# Patient Record
Sex: Female | Born: 1935 | Race: White | Hispanic: No | Smoking: Never smoker
Health system: Southern US, Community
[De-identification: ages and names within clinical notes are randomized; demographics above are authoritative.]

## PROBLEM LIST (undated history)

## (undated) DIAGNOSIS — R011 Cardiac murmur, unspecified: Secondary | ICD-10-CM

## (undated) DIAGNOSIS — T4145XA Adverse effect of unspecified anesthetic, initial encounter: Secondary | ICD-10-CM

## (undated) DIAGNOSIS — R112 Nausea with vomiting, unspecified: Secondary | ICD-10-CM

## (undated) DIAGNOSIS — G4730 Sleep apnea, unspecified: Secondary | ICD-10-CM

## (undated) DIAGNOSIS — T7840XA Allergy, unspecified, initial encounter: Secondary | ICD-10-CM

## (undated) DIAGNOSIS — J45909 Unspecified asthma, uncomplicated: Secondary | ICD-10-CM

## (undated) DIAGNOSIS — T8859XA Other complications of anesthesia, initial encounter: Secondary | ICD-10-CM

## (undated) DIAGNOSIS — Z9889 Other specified postprocedural states: Secondary | ICD-10-CM

## (undated) DIAGNOSIS — K219 Gastro-esophageal reflux disease without esophagitis: Secondary | ICD-10-CM

## (undated) DIAGNOSIS — J449 Chronic obstructive pulmonary disease, unspecified: Secondary | ICD-10-CM

## (undated) DIAGNOSIS — N3010 Interstitial cystitis (chronic) without hematuria: Secondary | ICD-10-CM

## (undated) HISTORY — DX: Allergy, unspecified, initial encounter: T78.40XA

## (undated) HISTORY — PX: CHOLECYSTECTOMY: SHX55

## (undated) HISTORY — PX: EYE SURGERY: SHX253

## (undated) HISTORY — PX: BLADDER SURGERY: SHX569

## (undated) HISTORY — PX: APPENDECTOMY: SHX54

## (undated) HISTORY — PX: ABDOMINAL HYSTERECTOMY: SHX81

## (undated) HISTORY — PX: TONSILLECTOMY: SUR1361

## (undated) HISTORY — DX: Unspecified asthma, uncomplicated: J45.909

---
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
## 2004-04-30 ENCOUNTER — Ambulatory Visit: Payer: Self-pay | Admitting: Internal Medicine

## 2005-05-31 ENCOUNTER — Ambulatory Visit: Payer: Self-pay | Admitting: Internal Medicine

## 2006-06-02 ENCOUNTER — Ambulatory Visit: Payer: Self-pay | Admitting: Internal Medicine

## 2006-06-12 ENCOUNTER — Ambulatory Visit: Payer: Self-pay | Admitting: Internal Medicine

## 2006-12-20 ENCOUNTER — Ambulatory Visit: Payer: Self-pay | Admitting: Internal Medicine

## 2007-06-04 ENCOUNTER — Ambulatory Visit: Payer: Self-pay | Admitting: Internal Medicine

## 2007-08-29 ENCOUNTER — Ambulatory Visit: Payer: Self-pay | Admitting: Internal Medicine

## 2008-01-13 ENCOUNTER — Ambulatory Visit: Payer: Self-pay | Admitting: Family Medicine

## 2008-06-10 ENCOUNTER — Ambulatory Visit: Payer: Self-pay | Admitting: Internal Medicine

## 2009-06-15 ENCOUNTER — Ambulatory Visit: Payer: Self-pay | Admitting: Internal Medicine

## 2010-01-27 ENCOUNTER — Emergency Department: Payer: Self-pay | Admitting: Emergency Medicine

## 2010-01-28 ENCOUNTER — Inpatient Hospital Stay: Payer: Self-pay | Admitting: Surgery

## 2010-01-31 ENCOUNTER — Ambulatory Visit: Payer: Self-pay | Admitting: Internal Medicine

## 2010-04-27 ENCOUNTER — Ambulatory Visit: Payer: Self-pay | Admitting: Urology

## 2010-07-08 ENCOUNTER — Ambulatory Visit: Payer: Self-pay | Admitting: Internal Medicine

## 2010-08-03 ENCOUNTER — Ambulatory Visit: Payer: Self-pay | Admitting: Urology

## 2010-08-31 ENCOUNTER — Ambulatory Visit: Payer: Self-pay | Admitting: Urology

## 2011-04-27 ENCOUNTER — Ambulatory Visit: Payer: Self-pay | Admitting: Unknown Physician Specialty

## 2011-07-21 ENCOUNTER — Ambulatory Visit: Payer: Self-pay | Admitting: Internal Medicine

## 2011-10-19 ENCOUNTER — Ambulatory Visit: Payer: Self-pay | Admitting: Internal Medicine

## 2012-01-13 ENCOUNTER — Ambulatory Visit: Payer: Self-pay | Admitting: Specialist

## 2012-01-23 ENCOUNTER — Ambulatory Visit: Payer: Self-pay | Admitting: Specialist

## 2012-01-23 LAB — CREATININE, SERUM
EGFR (African American): >60
EGFR (Non-African Amer.): 55 — ABNORMAL LOW

## 2012-10-03 ENCOUNTER — Ambulatory Visit: Payer: Self-pay | Admitting: Internal Medicine

## 2012-12-21 DIAGNOSIS — R339 Retention of urine, unspecified: Secondary | ICD-10-CM | POA: Insufficient documentation

## 2012-12-21 DIAGNOSIS — R351 Nocturia: Secondary | ICD-10-CM | POA: Insufficient documentation

## 2012-12-21 DIAGNOSIS — N3010 Interstitial cystitis (chronic) without hematuria: Secondary | ICD-10-CM | POA: Insufficient documentation

## 2012-12-21 DIAGNOSIS — N393 Stress incontinence (female) (male): Secondary | ICD-10-CM | POA: Insufficient documentation

## 2013-06-22 ENCOUNTER — Emergency Department: Payer: Self-pay | Admitting: Emergency Medicine

## 2013-06-22 LAB — CBC
HCT: 41 % (ref 35.0–47.0)
HGB: 12.7 g/dL (ref 12.0–16.0)
MCH: 25.3 pg — ABNORMAL LOW (ref 26.0–34.0)
MCHC: 30.9 g/dL — ABNORMAL LOW (ref 32.0–36.0)
MCV: 82 fL (ref 80–100)
Platelet: 280 10*3/uL (ref 150–440)
RBC: 5.01 10*6/uL (ref 3.80–5.20)
RDW: 15.5 % — ABNORMAL HIGH (ref 11.5–14.5)
WBC: 15.4 10*3/uL — ABNORMAL HIGH (ref 3.6–11.0)

## 2013-06-22 LAB — BASIC METABOLIC PANEL
Anion Gap: 7 (ref 7–16)
BUN: 16 mg/dL (ref 7–18)
Calcium, Total: 9.3 mg/dL (ref 8.5–10.1)
Chloride: 103 mmol/L (ref 98–107)
Co2: 29 mmol/L (ref 21–32)
Creatinine: 0.91 mg/dL (ref 0.60–1.30)
EGFR (African American): >60
EGFR (Non-African Amer.): >60
Glucose: 124 mg/dL — ABNORMAL HIGH (ref 65–99)
Osmolality: 280 (ref 275–301)
Potassium: 3.9 mmol/L (ref 3.5–5.1)
Sodium: 139 mmol/L (ref 136–145)

## 2013-09-11 DIAGNOSIS — R059 Cough, unspecified: Secondary | ICD-10-CM | POA: Insufficient documentation

## 2013-11-28 ENCOUNTER — Ambulatory Visit: Payer: Self-pay | Admitting: Internal Medicine

## 2013-12-04 ENCOUNTER — Ambulatory Visit: Payer: Self-pay | Admitting: Internal Medicine

## 2013-12-16 DIAGNOSIS — M1990 Unspecified osteoarthritis, unspecified site: Secondary | ICD-10-CM | POA: Insufficient documentation

## 2013-12-16 DIAGNOSIS — M0579 Rheumatoid arthritis with rheumatoid factor of multiple sites without organ or systems involvement: Secondary | ICD-10-CM | POA: Insufficient documentation

## 2013-12-16 DIAGNOSIS — M069 Rheumatoid arthritis, unspecified: Secondary | ICD-10-CM | POA: Insufficient documentation

## 2013-12-26 ENCOUNTER — Ambulatory Visit: Payer: Self-pay | Admitting: Specialist

## 2014-05-28 DIAGNOSIS — F419 Anxiety disorder, unspecified: Secondary | ICD-10-CM | POA: Insufficient documentation

## 2014-06-05 DIAGNOSIS — R8281 Pyuria: Secondary | ICD-10-CM | POA: Insufficient documentation

## 2014-06-05 DIAGNOSIS — R82998 Other abnormal findings in urine: Secondary | ICD-10-CM | POA: Insufficient documentation

## 2014-06-05 DIAGNOSIS — N390 Urinary tract infection, site not specified: Secondary | ICD-10-CM | POA: Insufficient documentation

## 2014-07-29 ENCOUNTER — Ambulatory Visit
Admission: RE | Admit: 2014-07-29 | Discharge: 2014-07-29 | Disposition: A | Discharge status: Home / Self Care | Payer: PPO | Source: Ambulatory Visit | Attending: Internal Medicine | Admitting: Internal Medicine

## 2014-07-29 ENCOUNTER — Other Ambulatory Visit: Payer: Self-pay | Admitting: Internal Medicine

## 2014-07-29 DIAGNOSIS — M7989 Other specified soft tissue disorders: Secondary | ICD-10-CM

## 2014-11-04 ENCOUNTER — Other Ambulatory Visit: Payer: Self-pay | Admitting: Internal Medicine

## 2014-11-04 DIAGNOSIS — Z1231 Encounter for screening mammogram for malignant neoplasm of breast: Secondary | ICD-10-CM

## 2014-12-08 ENCOUNTER — Ambulatory Visit: Payer: PPO

## 2014-12-16 ENCOUNTER — Ambulatory Visit
Admission: RE | Admit: 2014-12-16 | Discharge: 2014-12-16 | Disposition: A | Discharge status: Home / Self Care | Payer: PPO | Source: Ambulatory Visit | Attending: Internal Medicine | Admitting: Internal Medicine

## 2014-12-16 DIAGNOSIS — Z1231 Encounter for screening mammogram for malignant neoplasm of breast: Secondary | ICD-10-CM | POA: Diagnosis not present

## 2015-03-11 DIAGNOSIS — S92025K Nondisplaced fracture of anterior process of left calcaneus, subsequent encounter for fracture with nonunion: Secondary | ICD-10-CM | POA: Diagnosis not present

## 2015-03-11 DIAGNOSIS — M79672 Pain in left foot: Secondary | ICD-10-CM | POA: Diagnosis not present

## 2015-03-19 DIAGNOSIS — N3010 Interstitial cystitis (chronic) without hematuria: Secondary | ICD-10-CM | POA: Diagnosis not present

## 2015-03-19 DIAGNOSIS — J4530 Mild persistent asthma, uncomplicated: Secondary | ICD-10-CM | POA: Diagnosis not present

## 2015-03-19 DIAGNOSIS — M25512 Pain in left shoulder: Secondary | ICD-10-CM | POA: Diagnosis not present

## 2015-03-19 DIAGNOSIS — M0579 Rheumatoid arthritis with rheumatoid factor of multiple sites without organ or systems involvement: Secondary | ICD-10-CM | POA: Diagnosis not present

## 2015-03-19 DIAGNOSIS — M150 Primary generalized (osteo)arthritis: Secondary | ICD-10-CM | POA: Diagnosis not present

## 2015-03-19 DIAGNOSIS — R05 Cough: Secondary | ICD-10-CM | POA: Diagnosis not present

## 2015-04-01 DIAGNOSIS — M7582 Other shoulder lesions, left shoulder: Secondary | ICD-10-CM | POA: Insufficient documentation

## 2015-04-01 DIAGNOSIS — M25512 Pain in left shoulder: Secondary | ICD-10-CM | POA: Diagnosis not present

## 2015-04-02 DIAGNOSIS — M25612 Stiffness of left shoulder, not elsewhere classified: Secondary | ICD-10-CM | POA: Diagnosis not present

## 2015-04-02 DIAGNOSIS — M25512 Pain in left shoulder: Secondary | ICD-10-CM | POA: Diagnosis not present

## 2015-04-02 DIAGNOSIS — M7582 Other shoulder lesions, left shoulder: Secondary | ICD-10-CM | POA: Diagnosis not present

## 2015-04-02 DIAGNOSIS — M6281 Muscle weakness (generalized): Secondary | ICD-10-CM | POA: Diagnosis not present

## 2015-04-06 DIAGNOSIS — M25512 Pain in left shoulder: Secondary | ICD-10-CM | POA: Diagnosis not present

## 2015-04-06 DIAGNOSIS — M25612 Stiffness of left shoulder, not elsewhere classified: Secondary | ICD-10-CM | POA: Diagnosis not present

## 2015-04-06 DIAGNOSIS — M7582 Other shoulder lesions, left shoulder: Secondary | ICD-10-CM | POA: Diagnosis not present

## 2015-04-06 DIAGNOSIS — M6281 Muscle weakness (generalized): Secondary | ICD-10-CM | POA: Diagnosis not present

## 2015-04-08 DIAGNOSIS — M25512 Pain in left shoulder: Secondary | ICD-10-CM | POA: Diagnosis not present

## 2015-04-08 DIAGNOSIS — M6281 Muscle weakness (generalized): Secondary | ICD-10-CM | POA: Diagnosis not present

## 2015-04-08 DIAGNOSIS — M25612 Stiffness of left shoulder, not elsewhere classified: Secondary | ICD-10-CM | POA: Diagnosis not present

## 2015-04-08 DIAGNOSIS — M7582 Other shoulder lesions, left shoulder: Secondary | ICD-10-CM | POA: Diagnosis not present

## 2015-04-09 DIAGNOSIS — L309 Dermatitis, unspecified: Secondary | ICD-10-CM | POA: Diagnosis not present

## 2015-04-13 DIAGNOSIS — M7061 Trochanteric bursitis, right hip: Secondary | ICD-10-CM | POA: Insufficient documentation

## 2015-04-13 DIAGNOSIS — M25551 Pain in right hip: Secondary | ICD-10-CM | POA: Diagnosis not present

## 2015-04-13 DIAGNOSIS — M79672 Pain in left foot: Secondary | ICD-10-CM | POA: Diagnosis not present

## 2015-04-13 DIAGNOSIS — M7062 Trochanteric bursitis, left hip: Secondary | ICD-10-CM | POA: Diagnosis not present

## 2015-04-13 DIAGNOSIS — M25552 Pain in left hip: Secondary | ICD-10-CM | POA: Diagnosis not present

## 2015-04-13 DIAGNOSIS — S92025K Nondisplaced fracture of anterior process of left calcaneus, subsequent encounter for fracture with nonunion: Secondary | ICD-10-CM | POA: Diagnosis not present

## 2015-04-14 DIAGNOSIS — M6281 Muscle weakness (generalized): Secondary | ICD-10-CM | POA: Diagnosis not present

## 2015-04-14 DIAGNOSIS — M7582 Other shoulder lesions, left shoulder: Secondary | ICD-10-CM | POA: Diagnosis not present

## 2015-04-14 DIAGNOSIS — M25512 Pain in left shoulder: Secondary | ICD-10-CM | POA: Diagnosis not present

## 2015-04-14 DIAGNOSIS — M25612 Stiffness of left shoulder, not elsewhere classified: Secondary | ICD-10-CM | POA: Diagnosis not present

## 2015-04-16 DIAGNOSIS — M25512 Pain in left shoulder: Secondary | ICD-10-CM | POA: Diagnosis not present

## 2015-04-21 DIAGNOSIS — M25612 Stiffness of left shoulder, not elsewhere classified: Secondary | ICD-10-CM | POA: Diagnosis not present

## 2015-04-21 DIAGNOSIS — M25512 Pain in left shoulder: Secondary | ICD-10-CM | POA: Diagnosis not present

## 2015-04-21 DIAGNOSIS — M7582 Other shoulder lesions, left shoulder: Secondary | ICD-10-CM | POA: Diagnosis not present

## 2015-04-21 DIAGNOSIS — M6281 Muscle weakness (generalized): Secondary | ICD-10-CM | POA: Diagnosis not present

## 2015-04-23 DIAGNOSIS — M25512 Pain in left shoulder: Secondary | ICD-10-CM | POA: Diagnosis not present

## 2015-04-28 DIAGNOSIS — M25612 Stiffness of left shoulder, not elsewhere classified: Secondary | ICD-10-CM | POA: Diagnosis not present

## 2015-04-28 DIAGNOSIS — M25512 Pain in left shoulder: Secondary | ICD-10-CM | POA: Diagnosis not present

## 2015-04-28 DIAGNOSIS — M7582 Other shoulder lesions, left shoulder: Secondary | ICD-10-CM | POA: Diagnosis not present

## 2015-04-28 DIAGNOSIS — M6281 Muscle weakness (generalized): Secondary | ICD-10-CM | POA: Diagnosis not present

## 2015-04-30 DIAGNOSIS — M6281 Muscle weakness (generalized): Secondary | ICD-10-CM | POA: Diagnosis not present

## 2015-04-30 DIAGNOSIS — M7582 Other shoulder lesions, left shoulder: Secondary | ICD-10-CM | POA: Diagnosis not present

## 2015-04-30 DIAGNOSIS — M25512 Pain in left shoulder: Secondary | ICD-10-CM | POA: Diagnosis not present

## 2015-04-30 DIAGNOSIS — M25612 Stiffness of left shoulder, not elsewhere classified: Secondary | ICD-10-CM | POA: Diagnosis not present

## 2015-05-11 DIAGNOSIS — M25612 Stiffness of left shoulder, not elsewhere classified: Secondary | ICD-10-CM | POA: Diagnosis not present

## 2015-05-11 DIAGNOSIS — S92025K Nondisplaced fracture of anterior process of left calcaneus, subsequent encounter for fracture with nonunion: Secondary | ICD-10-CM | POA: Diagnosis not present

## 2015-05-11 DIAGNOSIS — M25512 Pain in left shoulder: Secondary | ICD-10-CM | POA: Diagnosis not present

## 2015-05-11 DIAGNOSIS — M7582 Other shoulder lesions, left shoulder: Secondary | ICD-10-CM | POA: Diagnosis not present

## 2015-05-11 DIAGNOSIS — M79672 Pain in left foot: Secondary | ICD-10-CM | POA: Diagnosis not present

## 2015-05-11 DIAGNOSIS — M6281 Muscle weakness (generalized): Secondary | ICD-10-CM | POA: Diagnosis not present

## 2015-05-12 DIAGNOSIS — H35372 Puckering of macula, left eye: Secondary | ICD-10-CM | POA: Diagnosis not present

## 2015-05-19 DIAGNOSIS — M7582 Other shoulder lesions, left shoulder: Secondary | ICD-10-CM | POA: Diagnosis not present

## 2015-05-19 DIAGNOSIS — M25612 Stiffness of left shoulder, not elsewhere classified: Secondary | ICD-10-CM | POA: Diagnosis not present

## 2015-05-19 DIAGNOSIS — M25512 Pain in left shoulder: Secondary | ICD-10-CM | POA: Diagnosis not present

## 2015-05-19 DIAGNOSIS — M6281 Muscle weakness (generalized): Secondary | ICD-10-CM | POA: Diagnosis not present

## 2015-05-27 DIAGNOSIS — M7582 Other shoulder lesions, left shoulder: Secondary | ICD-10-CM | POA: Diagnosis not present

## 2015-06-05 DIAGNOSIS — N393 Stress incontinence (female) (male): Secondary | ICD-10-CM | POA: Diagnosis not present

## 2015-06-05 DIAGNOSIS — R350 Frequency of micturition: Secondary | ICD-10-CM | POA: Diagnosis not present

## 2015-06-05 DIAGNOSIS — R339 Retention of urine, unspecified: Secondary | ICD-10-CM | POA: Diagnosis not present

## 2015-06-05 DIAGNOSIS — N3010 Interstitial cystitis (chronic) without hematuria: Secondary | ICD-10-CM | POA: Diagnosis not present

## 2015-06-15 DIAGNOSIS — M79672 Pain in left foot: Secondary | ICD-10-CM | POA: Diagnosis not present

## 2015-06-15 DIAGNOSIS — S92025K Nondisplaced fracture of anterior process of left calcaneus, subsequent encounter for fracture with nonunion: Secondary | ICD-10-CM | POA: Diagnosis not present

## 2015-06-18 DIAGNOSIS — M0579 Rheumatoid arthritis with rheumatoid factor of multiple sites without organ or systems involvement: Secondary | ICD-10-CM | POA: Diagnosis not present

## 2015-06-22 DIAGNOSIS — M25675 Stiffness of left foot, not elsewhere classified: Secondary | ICD-10-CM | POA: Diagnosis not present

## 2015-06-22 DIAGNOSIS — M6281 Muscle weakness (generalized): Secondary | ICD-10-CM | POA: Diagnosis not present

## 2015-06-22 DIAGNOSIS — M79672 Pain in left foot: Secondary | ICD-10-CM | POA: Diagnosis not present

## 2015-06-22 DIAGNOSIS — R262 Difficulty in walking, not elsewhere classified: Secondary | ICD-10-CM | POA: Diagnosis not present

## 2015-06-25 DIAGNOSIS — J449 Chronic obstructive pulmonary disease, unspecified: Secondary | ICD-10-CM | POA: Diagnosis not present

## 2015-06-25 DIAGNOSIS — R0609 Other forms of dyspnea: Secondary | ICD-10-CM | POA: Diagnosis not present

## 2015-06-25 DIAGNOSIS — J986 Disorders of diaphragm: Secondary | ICD-10-CM | POA: Diagnosis not present

## 2015-06-25 DIAGNOSIS — M0579 Rheumatoid arthritis with rheumatoid factor of multiple sites without organ or systems involvement: Secondary | ICD-10-CM | POA: Diagnosis not present

## 2015-06-25 DIAGNOSIS — J45909 Unspecified asthma, uncomplicated: Secondary | ICD-10-CM | POA: Diagnosis not present

## 2015-06-25 DIAGNOSIS — M6281 Muscle weakness (generalized): Secondary | ICD-10-CM | POA: Diagnosis not present

## 2015-06-25 DIAGNOSIS — M25675 Stiffness of left foot, not elsewhere classified: Secondary | ICD-10-CM | POA: Diagnosis not present

## 2015-06-25 DIAGNOSIS — R05 Cough: Secondary | ICD-10-CM | POA: Diagnosis not present

## 2015-06-25 DIAGNOSIS — M79672 Pain in left foot: Secondary | ICD-10-CM | POA: Diagnosis not present

## 2015-06-25 DIAGNOSIS — M150 Primary generalized (osteo)arthritis: Secondary | ICD-10-CM | POA: Diagnosis not present

## 2015-06-25 DIAGNOSIS — M65331 Trigger finger, right middle finger: Secondary | ICD-10-CM | POA: Diagnosis not present

## 2015-06-25 DIAGNOSIS — R262 Difficulty in walking, not elsewhere classified: Secondary | ICD-10-CM | POA: Diagnosis not present

## 2015-06-29 DIAGNOSIS — M25675 Stiffness of left foot, not elsewhere classified: Secondary | ICD-10-CM | POA: Diagnosis not present

## 2015-06-29 DIAGNOSIS — M6281 Muscle weakness (generalized): Secondary | ICD-10-CM | POA: Diagnosis not present

## 2015-06-29 DIAGNOSIS — M79672 Pain in left foot: Secondary | ICD-10-CM | POA: Diagnosis not present

## 2015-07-01 DIAGNOSIS — M79672 Pain in left foot: Secondary | ICD-10-CM | POA: Diagnosis not present

## 2015-07-01 DIAGNOSIS — R262 Difficulty in walking, not elsewhere classified: Secondary | ICD-10-CM | POA: Diagnosis not present

## 2015-07-01 DIAGNOSIS — M25675 Stiffness of left foot, not elsewhere classified: Secondary | ICD-10-CM | POA: Diagnosis not present

## 2015-07-01 DIAGNOSIS — M6281 Muscle weakness (generalized): Secondary | ICD-10-CM | POA: Diagnosis not present

## 2015-07-06 DIAGNOSIS — M6281 Muscle weakness (generalized): Secondary | ICD-10-CM | POA: Diagnosis not present

## 2015-07-06 DIAGNOSIS — M79672 Pain in left foot: Secondary | ICD-10-CM | POA: Diagnosis not present

## 2015-07-06 DIAGNOSIS — M25675 Stiffness of left foot, not elsewhere classified: Secondary | ICD-10-CM | POA: Diagnosis not present

## 2015-07-08 DIAGNOSIS — R262 Difficulty in walking, not elsewhere classified: Secondary | ICD-10-CM | POA: Diagnosis not present

## 2015-07-08 DIAGNOSIS — M79672 Pain in left foot: Secondary | ICD-10-CM | POA: Diagnosis not present

## 2015-07-08 DIAGNOSIS — M25675 Stiffness of left foot, not elsewhere classified: Secondary | ICD-10-CM | POA: Diagnosis not present

## 2015-07-08 DIAGNOSIS — M6281 Muscle weakness (generalized): Secondary | ICD-10-CM | POA: Diagnosis not present

## 2015-07-15 DIAGNOSIS — S92025K Nondisplaced fracture of anterior process of left calcaneus, subsequent encounter for fracture with nonunion: Secondary | ICD-10-CM | POA: Diagnosis not present

## 2015-07-15 DIAGNOSIS — M25675 Stiffness of left foot, not elsewhere classified: Secondary | ICD-10-CM | POA: Diagnosis not present

## 2015-07-15 DIAGNOSIS — M79672 Pain in left foot: Secondary | ICD-10-CM | POA: Diagnosis not present

## 2015-07-15 DIAGNOSIS — M6281 Muscle weakness (generalized): Secondary | ICD-10-CM | POA: Diagnosis not present

## 2015-07-16 DIAGNOSIS — Z780 Asymptomatic menopausal state: Secondary | ICD-10-CM | POA: Diagnosis not present

## 2015-07-16 DIAGNOSIS — F413 Other mixed anxiety disorders: Secondary | ICD-10-CM | POA: Diagnosis not present

## 2015-07-16 DIAGNOSIS — M0579 Rheumatoid arthritis with rheumatoid factor of multiple sites without organ or systems involvement: Secondary | ICD-10-CM | POA: Diagnosis not present

## 2015-07-16 DIAGNOSIS — R339 Retention of urine, unspecified: Secondary | ICD-10-CM | POA: Diagnosis not present

## 2015-07-16 DIAGNOSIS — M150 Primary generalized (osteo)arthritis: Secondary | ICD-10-CM | POA: Diagnosis not present

## 2015-07-16 DIAGNOSIS — Z6841 Body Mass Index (BMI) 40.0 and over, adult: Secondary | ICD-10-CM | POA: Diagnosis not present

## 2015-07-16 DIAGNOSIS — J4530 Mild persistent asthma, uncomplicated: Secondary | ICD-10-CM | POA: Diagnosis not present

## 2015-07-16 DIAGNOSIS — R05 Cough: Secondary | ICD-10-CM | POA: Diagnosis not present

## 2015-07-22 DIAGNOSIS — M79672 Pain in left foot: Secondary | ICD-10-CM | POA: Diagnosis not present

## 2015-07-22 DIAGNOSIS — M6281 Muscle weakness (generalized): Secondary | ICD-10-CM | POA: Diagnosis not present

## 2015-07-22 DIAGNOSIS — M25675 Stiffness of left foot, not elsewhere classified: Secondary | ICD-10-CM | POA: Diagnosis not present

## 2015-07-23 DIAGNOSIS — M6281 Muscle weakness (generalized): Secondary | ICD-10-CM | POA: Diagnosis not present

## 2015-07-23 DIAGNOSIS — M79672 Pain in left foot: Secondary | ICD-10-CM | POA: Diagnosis not present

## 2015-07-23 DIAGNOSIS — R262 Difficulty in walking, not elsewhere classified: Secondary | ICD-10-CM | POA: Diagnosis not present

## 2015-07-23 DIAGNOSIS — M25675 Stiffness of left foot, not elsewhere classified: Secondary | ICD-10-CM | POA: Diagnosis not present

## 2015-07-28 DIAGNOSIS — R339 Retention of urine, unspecified: Secondary | ICD-10-CM | POA: Diagnosis not present

## 2015-07-28 DIAGNOSIS — R05 Cough: Secondary | ICD-10-CM | POA: Diagnosis not present

## 2015-07-28 DIAGNOSIS — F413 Other mixed anxiety disorders: Secondary | ICD-10-CM | POA: Diagnosis not present

## 2015-07-28 DIAGNOSIS — Z780 Asymptomatic menopausal state: Secondary | ICD-10-CM | POA: Diagnosis not present

## 2015-07-28 DIAGNOSIS — Z6841 Body Mass Index (BMI) 40.0 and over, adult: Secondary | ICD-10-CM | POA: Diagnosis not present

## 2015-07-28 DIAGNOSIS — J4530 Mild persistent asthma, uncomplicated: Secondary | ICD-10-CM | POA: Diagnosis not present

## 2015-07-28 DIAGNOSIS — M150 Primary generalized (osteo)arthritis: Secondary | ICD-10-CM | POA: Diagnosis not present

## 2015-07-28 DIAGNOSIS — M0579 Rheumatoid arthritis with rheumatoid factor of multiple sites without organ or systems involvement: Secondary | ICD-10-CM | POA: Diagnosis not present

## 2015-08-13 DIAGNOSIS — H903 Sensorineural hearing loss, bilateral: Secondary | ICD-10-CM | POA: Diagnosis not present

## 2015-08-13 DIAGNOSIS — H6123 Impacted cerumen, bilateral: Secondary | ICD-10-CM | POA: Diagnosis not present

## 2015-10-07 DIAGNOSIS — R339 Retention of urine, unspecified: Secondary | ICD-10-CM | POA: Diagnosis not present

## 2015-10-29 DIAGNOSIS — M65351 Trigger finger, right little finger: Secondary | ICD-10-CM | POA: Diagnosis not present

## 2015-10-29 DIAGNOSIS — R05 Cough: Secondary | ICD-10-CM | POA: Diagnosis not present

## 2015-10-29 DIAGNOSIS — R0609 Other forms of dyspnea: Secondary | ICD-10-CM | POA: Diagnosis not present

## 2015-10-29 DIAGNOSIS — M0579 Rheumatoid arthritis with rheumatoid factor of multiple sites without organ or systems involvement: Secondary | ICD-10-CM | POA: Diagnosis not present

## 2015-10-29 DIAGNOSIS — J4530 Mild persistent asthma, uncomplicated: Secondary | ICD-10-CM | POA: Diagnosis not present

## 2015-10-29 DIAGNOSIS — J310 Chronic rhinitis: Secondary | ICD-10-CM | POA: Diagnosis not present

## 2015-12-16 DIAGNOSIS — M150 Primary generalized (osteo)arthritis: Secondary | ICD-10-CM | POA: Diagnosis not present

## 2015-12-16 DIAGNOSIS — M0579 Rheumatoid arthritis with rheumatoid factor of multiple sites without organ or systems involvement: Secondary | ICD-10-CM | POA: Diagnosis not present

## 2015-12-23 DIAGNOSIS — M150 Primary generalized (osteo)arthritis: Secondary | ICD-10-CM | POA: Diagnosis not present

## 2015-12-23 DIAGNOSIS — G8929 Other chronic pain: Secondary | ICD-10-CM | POA: Diagnosis not present

## 2015-12-23 DIAGNOSIS — M65351 Trigger finger, right little finger: Secondary | ICD-10-CM | POA: Diagnosis not present

## 2015-12-23 DIAGNOSIS — M0579 Rheumatoid arthritis with rheumatoid factor of multiple sites without organ or systems involvement: Secondary | ICD-10-CM | POA: Diagnosis not present

## 2015-12-23 DIAGNOSIS — M545 Low back pain: Secondary | ICD-10-CM | POA: Diagnosis not present

## 2016-01-15 DIAGNOSIS — R339 Retention of urine, unspecified: Secondary | ICD-10-CM | POA: Diagnosis not present

## 2016-01-15 DIAGNOSIS — R05 Cough: Secondary | ICD-10-CM | POA: Diagnosis not present

## 2016-01-15 DIAGNOSIS — J4530 Mild persistent asthma, uncomplicated: Secondary | ICD-10-CM | POA: Diagnosis not present

## 2016-01-15 DIAGNOSIS — M0579 Rheumatoid arthritis with rheumatoid factor of multiple sites without organ or systems involvement: Secondary | ICD-10-CM | POA: Diagnosis not present

## 2016-01-15 DIAGNOSIS — Z780 Asymptomatic menopausal state: Secondary | ICD-10-CM | POA: Diagnosis not present

## 2016-01-15 DIAGNOSIS — F413 Other mixed anxiety disorders: Secondary | ICD-10-CM | POA: Diagnosis not present

## 2016-01-15 DIAGNOSIS — M150 Primary generalized (osteo)arthritis: Secondary | ICD-10-CM | POA: Diagnosis not present

## 2016-01-15 DIAGNOSIS — Z6841 Body Mass Index (BMI) 40.0 and over, adult: Secondary | ICD-10-CM | POA: Diagnosis not present

## 2016-01-18 ENCOUNTER — Other Ambulatory Visit: Payer: Self-pay | Admitting: Internal Medicine

## 2016-01-18 DIAGNOSIS — Z1231 Encounter for screening mammogram for malignant neoplasm of breast: Secondary | ICD-10-CM

## 2016-01-22 DIAGNOSIS — F419 Anxiety disorder, unspecified: Secondary | ICD-10-CM | POA: Diagnosis not present

## 2016-01-22 DIAGNOSIS — Z0000 Encounter for general adult medical examination without abnormal findings: Secondary | ICD-10-CM | POA: Diagnosis not present

## 2016-01-22 DIAGNOSIS — R05 Cough: Secondary | ICD-10-CM | POA: Diagnosis not present

## 2016-01-22 DIAGNOSIS — Z6841 Body Mass Index (BMI) 40.0 and over, adult: Secondary | ICD-10-CM | POA: Diagnosis not present

## 2016-01-22 DIAGNOSIS — M150 Primary generalized (osteo)arthritis: Secondary | ICD-10-CM | POA: Diagnosis not present

## 2016-01-22 DIAGNOSIS — J4540 Moderate persistent asthma, uncomplicated: Secondary | ICD-10-CM | POA: Diagnosis not present

## 2016-01-22 DIAGNOSIS — N3010 Interstitial cystitis (chronic) without hematuria: Secondary | ICD-10-CM | POA: Diagnosis not present

## 2016-01-22 DIAGNOSIS — Z23 Encounter for immunization: Secondary | ICD-10-CM | POA: Diagnosis not present

## 2016-02-02 DIAGNOSIS — R0609 Other forms of dyspnea: Secondary | ICD-10-CM | POA: Diagnosis not present

## 2016-02-02 DIAGNOSIS — J310 Chronic rhinitis: Secondary | ICD-10-CM | POA: Diagnosis not present

## 2016-02-02 DIAGNOSIS — R05 Cough: Secondary | ICD-10-CM | POA: Diagnosis not present

## 2016-02-02 DIAGNOSIS — J4540 Moderate persistent asthma, uncomplicated: Secondary | ICD-10-CM | POA: Diagnosis not present

## 2016-02-10 DIAGNOSIS — R300 Dysuria: Secondary | ICD-10-CM | POA: Diagnosis not present

## 2016-02-10 DIAGNOSIS — R350 Frequency of micturition: Secondary | ICD-10-CM | POA: Diagnosis not present

## 2016-02-10 DIAGNOSIS — R339 Retention of urine, unspecified: Secondary | ICD-10-CM | POA: Diagnosis not present

## 2016-02-25 ENCOUNTER — Ambulatory Visit
Admission: RE | Admit: 2016-02-25 | Discharge: 2016-02-25 | Disposition: A | Discharge status: Home / Self Care | Payer: PPO | Source: Ambulatory Visit | Attending: Internal Medicine | Admitting: Internal Medicine

## 2016-02-25 DIAGNOSIS — Z1231 Encounter for screening mammogram for malignant neoplasm of breast: Secondary | ICD-10-CM | POA: Diagnosis not present

## 2016-03-09 DIAGNOSIS — R05 Cough: Secondary | ICD-10-CM | POA: Diagnosis not present

## 2016-03-09 DIAGNOSIS — J4540 Moderate persistent asthma, uncomplicated: Secondary | ICD-10-CM | POA: Diagnosis not present

## 2016-03-09 DIAGNOSIS — J310 Chronic rhinitis: Secondary | ICD-10-CM | POA: Diagnosis not present

## 2016-03-14 DIAGNOSIS — R350 Frequency of micturition: Secondary | ICD-10-CM | POA: Diagnosis not present

## 2016-04-27 DIAGNOSIS — T85111A Breakdown (mechanical) of implanted electronic neurostimulator (electrode) of peripheral nerve, initial encounter: Secondary | ICD-10-CM | POA: Diagnosis not present

## 2016-04-27 DIAGNOSIS — N3010 Interstitial cystitis (chronic) without hematuria: Secondary | ICD-10-CM | POA: Diagnosis not present

## 2016-04-27 DIAGNOSIS — R350 Frequency of micturition: Secondary | ICD-10-CM | POA: Diagnosis not present

## 2016-04-27 DIAGNOSIS — R351 Nocturia: Secondary | ICD-10-CM | POA: Diagnosis not present

## 2016-05-12 DIAGNOSIS — Z0000 Encounter for general adult medical examination without abnormal findings: Secondary | ICD-10-CM | POA: Diagnosis not present

## 2016-05-12 DIAGNOSIS — R8290 Unspecified abnormal findings in urine: Secondary | ICD-10-CM | POA: Diagnosis not present

## 2016-05-12 DIAGNOSIS — R05 Cough: Secondary | ICD-10-CM | POA: Diagnosis not present

## 2016-05-12 DIAGNOSIS — J4540 Moderate persistent asthma, uncomplicated: Secondary | ICD-10-CM | POA: Diagnosis not present

## 2016-05-12 DIAGNOSIS — H35372 Puckering of macula, left eye: Secondary | ICD-10-CM | POA: Diagnosis not present

## 2016-05-12 DIAGNOSIS — F419 Anxiety disorder, unspecified: Secondary | ICD-10-CM | POA: Diagnosis not present

## 2016-05-12 DIAGNOSIS — N3010 Interstitial cystitis (chronic) without hematuria: Secondary | ICD-10-CM | POA: Diagnosis not present

## 2016-05-12 DIAGNOSIS — M150 Primary generalized (osteo)arthritis: Secondary | ICD-10-CM | POA: Diagnosis not present

## 2016-05-18 DIAGNOSIS — E785 Hyperlipidemia, unspecified: Secondary | ICD-10-CM | POA: Diagnosis not present

## 2016-05-18 DIAGNOSIS — N3010 Interstitial cystitis (chronic) without hematuria: Secondary | ICD-10-CM | POA: Diagnosis not present

## 2016-05-18 DIAGNOSIS — Y752 Prosthetic and other implants, materials and neurological devices associated with adverse incidents: Secondary | ICD-10-CM | POA: Diagnosis not present

## 2016-05-18 DIAGNOSIS — J45909 Unspecified asthma, uncomplicated: Secondary | ICD-10-CM | POA: Diagnosis not present

## 2016-05-18 DIAGNOSIS — M059 Rheumatoid arthritis with rheumatoid factor, unspecified: Secondary | ICD-10-CM | POA: Diagnosis not present

## 2016-05-18 DIAGNOSIS — R350 Frequency of micturition: Secondary | ICD-10-CM | POA: Diagnosis not present

## 2016-05-18 DIAGNOSIS — Z6841 Body Mass Index (BMI) 40.0 and over, adult: Secondary | ICD-10-CM | POA: Diagnosis not present

## 2016-05-18 DIAGNOSIS — R938 Abnormal findings on diagnostic imaging of other specified body structures: Secondary | ICD-10-CM | POA: Diagnosis not present

## 2016-05-18 DIAGNOSIS — T85111A Breakdown (mechanical) of implanted electronic neurostimulator (electrode) of peripheral nerve, initial encounter: Secondary | ICD-10-CM | POA: Diagnosis not present

## 2016-05-18 DIAGNOSIS — T85111D Breakdown (mechanical) of implanted electronic neurostimulator (electrode) of peripheral nerve, subsequent encounter: Secondary | ICD-10-CM | POA: Diagnosis not present

## 2016-05-18 DIAGNOSIS — Z01818 Encounter for other preprocedural examination: Secondary | ICD-10-CM | POA: Diagnosis not present

## 2016-05-19 ENCOUNTER — Other Ambulatory Visit: Payer: Self-pay | Admitting: Internal Medicine

## 2016-05-19 DIAGNOSIS — M545 Low back pain: Principal | ICD-10-CM

## 2016-05-19 DIAGNOSIS — M150 Primary generalized (osteo)arthritis: Secondary | ICD-10-CM | POA: Diagnosis not present

## 2016-05-19 DIAGNOSIS — F419 Anxiety disorder, unspecified: Secondary | ICD-10-CM | POA: Diagnosis not present

## 2016-05-19 DIAGNOSIS — Z6841 Body Mass Index (BMI) 40.0 and over, adult: Secondary | ICD-10-CM | POA: Diagnosis not present

## 2016-05-19 DIAGNOSIS — G8929 Other chronic pain: Secondary | ICD-10-CM

## 2016-05-19 DIAGNOSIS — R05 Cough: Secondary | ICD-10-CM | POA: Diagnosis not present

## 2016-05-19 DIAGNOSIS — N3010 Interstitial cystitis (chronic) without hematuria: Secondary | ICD-10-CM | POA: Diagnosis not present

## 2016-05-19 DIAGNOSIS — M25572 Pain in left ankle and joints of left foot: Secondary | ICD-10-CM | POA: Diagnosis not present

## 2016-05-19 DIAGNOSIS — I8390 Asymptomatic varicose veins of unspecified lower extremity: Secondary | ICD-10-CM | POA: Diagnosis not present

## 2016-05-19 DIAGNOSIS — J4540 Moderate persistent asthma, uncomplicated: Secondary | ICD-10-CM | POA: Diagnosis not present

## 2016-05-19 DIAGNOSIS — Z0000 Encounter for general adult medical examination without abnormal findings: Secondary | ICD-10-CM | POA: Diagnosis not present

## 2016-05-30 DIAGNOSIS — J4530 Mild persistent asthma, uncomplicated: Secondary | ICD-10-CM | POA: Diagnosis not present

## 2016-05-30 DIAGNOSIS — J310 Chronic rhinitis: Secondary | ICD-10-CM | POA: Diagnosis not present

## 2016-05-30 DIAGNOSIS — R0609 Other forms of dyspnea: Secondary | ICD-10-CM | POA: Diagnosis not present

## 2016-05-31 DIAGNOSIS — M79672 Pain in left foot: Secondary | ICD-10-CM | POA: Diagnosis not present

## 2016-05-31 DIAGNOSIS — M25572 Pain in left ankle and joints of left foot: Secondary | ICD-10-CM | POA: Diagnosis not present

## 2016-06-07 ENCOUNTER — Ambulatory Visit
Admission: RE | Admit: 2016-06-07 | Discharge: 2016-06-07 | Disposition: A | Discharge status: Home / Self Care | Payer: PPO | Source: Ambulatory Visit | Attending: Internal Medicine | Admitting: Internal Medicine

## 2016-06-07 DIAGNOSIS — G8929 Other chronic pain: Secondary | ICD-10-CM | POA: Diagnosis not present

## 2016-06-07 DIAGNOSIS — M545 Low back pain, unspecified: Secondary | ICD-10-CM

## 2016-06-07 DIAGNOSIS — M4187 Other forms of scoliosis, lumbosacral region: Secondary | ICD-10-CM | POA: Insufficient documentation

## 2016-06-07 DIAGNOSIS — M2578 Osteophyte, vertebrae: Secondary | ICD-10-CM | POA: Diagnosis not present

## 2016-06-07 DIAGNOSIS — M4186 Other forms of scoliosis, lumbar region: Secondary | ICD-10-CM | POA: Insufficient documentation

## 2016-06-07 DIAGNOSIS — M5136 Other intervertebral disc degeneration, lumbar region: Secondary | ICD-10-CM | POA: Insufficient documentation

## 2016-06-07 DIAGNOSIS — I700 Atherosclerosis of aorta: Secondary | ICD-10-CM | POA: Diagnosis not present

## 2016-06-07 DIAGNOSIS — M5137 Other intervertebral disc degeneration, lumbosacral region: Secondary | ICD-10-CM | POA: Diagnosis not present

## 2016-06-13 DIAGNOSIS — Z462 Encounter for fitting and adjustment of other devices related to nervous system and special senses: Secondary | ICD-10-CM | POA: Diagnosis not present

## 2016-06-13 DIAGNOSIS — F419 Anxiety disorder, unspecified: Secondary | ICD-10-CM | POA: Diagnosis not present

## 2016-06-13 DIAGNOSIS — R350 Frequency of micturition: Secondary | ICD-10-CM | POA: Diagnosis not present

## 2016-06-13 DIAGNOSIS — M069 Rheumatoid arthritis, unspecified: Secondary | ICD-10-CM | POA: Diagnosis not present

## 2016-06-13 DIAGNOSIS — J45909 Unspecified asthma, uncomplicated: Secondary | ICD-10-CM | POA: Diagnosis not present

## 2016-06-13 DIAGNOSIS — K219 Gastro-esophageal reflux disease without esophagitis: Secondary | ICD-10-CM | POA: Diagnosis not present

## 2016-06-13 DIAGNOSIS — T85113A Breakdown (mechanical) of implanted electronic neurostimulator, generator, initial encounter: Secondary | ICD-10-CM | POA: Diagnosis not present

## 2016-06-13 DIAGNOSIS — Z880 Allergy status to penicillin: Secondary | ICD-10-CM | POA: Diagnosis not present

## 2016-06-13 DIAGNOSIS — R300 Dysuria: Secondary | ICD-10-CM | POA: Diagnosis not present

## 2016-06-13 DIAGNOSIS — R351 Nocturia: Secondary | ICD-10-CM | POA: Diagnosis not present

## 2016-06-13 DIAGNOSIS — Z885 Allergy status to narcotic agent status: Secondary | ICD-10-CM | POA: Diagnosis not present

## 2016-06-13 DIAGNOSIS — N3010 Interstitial cystitis (chronic) without hematuria: Secondary | ICD-10-CM | POA: Diagnosis not present

## 2016-06-13 DIAGNOSIS — M1990 Unspecified osteoarthritis, unspecified site: Secondary | ICD-10-CM | POA: Diagnosis not present

## 2016-06-13 DIAGNOSIS — N393 Stress incontinence (female) (male): Secondary | ICD-10-CM | POA: Diagnosis not present

## 2016-06-13 DIAGNOSIS — E785 Hyperlipidemia, unspecified: Secondary | ICD-10-CM | POA: Diagnosis not present

## 2016-06-13 DIAGNOSIS — R3915 Urgency of urination: Secondary | ICD-10-CM | POA: Diagnosis not present

## 2016-06-13 DIAGNOSIS — Z7951 Long term (current) use of inhaled steroids: Secondary | ICD-10-CM | POA: Diagnosis not present

## 2016-06-13 DIAGNOSIS — R3914 Feeling of incomplete bladder emptying: Secondary | ICD-10-CM | POA: Diagnosis not present

## 2016-06-13 DIAGNOSIS — T85111A Breakdown (mechanical) of implanted electronic neurostimulator (electrode) of peripheral nerve, initial encounter: Secondary | ICD-10-CM | POA: Diagnosis not present

## 2016-06-22 DIAGNOSIS — M0579 Rheumatoid arthritis with rheumatoid factor of multiple sites without organ or systems involvement: Secondary | ICD-10-CM | POA: Diagnosis not present

## 2016-06-22 DIAGNOSIS — M150 Primary generalized (osteo)arthritis: Secondary | ICD-10-CM | POA: Diagnosis not present

## 2016-06-22 DIAGNOSIS — M65351 Trigger finger, right little finger: Secondary | ICD-10-CM | POA: Diagnosis not present

## 2016-06-28 DIAGNOSIS — R350 Frequency of micturition: Secondary | ICD-10-CM | POA: Diagnosis not present

## 2016-06-28 DIAGNOSIS — T85111S Breakdown (mechanical) of implanted electronic neurostimulator (electrode) of peripheral nerve, sequela: Secondary | ICD-10-CM | POA: Diagnosis not present

## 2016-06-28 DIAGNOSIS — N3010 Interstitial cystitis (chronic) without hematuria: Secondary | ICD-10-CM | POA: Diagnosis not present

## 2016-07-04 ENCOUNTER — Ambulatory Visit (INDEPENDENT_AMBULATORY_CARE_PROVIDER_SITE_OTHER): Payer: PPO | Admitting: Vascular Surgery

## 2016-07-04 ENCOUNTER — Encounter (INDEPENDENT_AMBULATORY_CARE_PROVIDER_SITE_OTHER): Payer: Self-pay | Admitting: Vascular Surgery

## 2016-07-04 DIAGNOSIS — J45909 Unspecified asthma, uncomplicated: Secondary | ICD-10-CM | POA: Diagnosis not present

## 2016-07-04 DIAGNOSIS — I890 Lymphedema, not elsewhere classified: Secondary | ICD-10-CM | POA: Diagnosis not present

## 2016-07-04 DIAGNOSIS — M25572 Pain in left ankle and joints of left foot: Secondary | ICD-10-CM | POA: Diagnosis not present

## 2016-07-04 DIAGNOSIS — I872 Venous insufficiency (chronic) (peripheral): Secondary | ICD-10-CM

## 2016-07-04 DIAGNOSIS — E785 Hyperlipidemia, unspecified: Secondary | ICD-10-CM | POA: Diagnosis not present

## 2016-07-04 NOTE — Progress Notes (Signed)
MRN : [id number]  Amber Kirk is a 82 y.o. (Nov 21, 1934) female who presents with chief complaint of  Chief Complaint  Patient presents with  . New Patient (Initial Visit)  .  History of Present Illness: The patient returns for followup after the initial visit. The patient continues to have pain in the lower extremities with dependency. The pain is lessened with elevation. Graduated compression stockings, Class I (20-30 mmHg), have been worn but the stockings do not eliminate the leg pain. Over-the-counter analgesics do not improve the symptoms. The degree of discomfort continues to interfere with daily activities. The patient notes the pain in the legs is causing problems with daily exercise, at the workplace and even with household activities and maintenance such as standing in the kitchen preparing meals and doing dishes.   Previous venous ultrasound shows normal deep venous system, no evidence of acute or chronic DVT.  Superficial reflux is present in the not  Current Meds  Medication Sig  . albuterol (PROVENTIL) (2.5 MG/3ML) 0.083% nebulizer solution   . aspirin EC 81 MG tablet Take 81 mg by mouth.  . Cholecalciferol (VITAMIN D-1000 MAX ST) 1000 units tablet Take by mouth.  . diclofenac sodium (VOLTAREN) 1 % GEL   . diphenhydrAMINE (BENADRYL) 25 mg capsule Take by mouth.  . doxepin (SINEQUAN) 25 MG capsule   . ELMIRON 100 MG capsule   . hydroxychloroquine (PLAQUENIL) 200 MG tablet   . lovastatin (MEVACOR) 40 MG tablet   . mometasone (NASONEX) 50 MCG/ACT nasal spray Frequency:QD   Dosage:50   MCG  Instructions:  Note:Dose: 50MCG  . montelukast (SINGULAIR) 10 MG tablet   . Multiple Vitamin (MULTI-VITAMINS) TABS Take by mouth.  . Olopatadine HCl 0.2 % SOLN   . omeprazole (PRILOSEC) 20 MG capsule   . pentosan polysulfate (ELMIRON) 100 MG capsule   . SYMBICORT 160-4.5 MCG/ACT inhaler     Past Medical History:  Diagnosis Date  . Allergy   . Asthma     Past  Surgical History:  Procedure Laterality Date  . ABDOMINAL HYSTERECTOMY    . APPENDECTOMY    . BLADDER SURGERY    . CHOLECYSTECTOMY    . TONSILLECTOMY      Social History Social History  Substance Use Topics  . Smoking status: Never Smoker  . Smokeless tobacco: Never Used  . Alcohol use No    Family History Family History  Problem Relation Age of Onset  . Heart disease Mother   . Stroke Father   No family history of bleeding/clotting disorders, porphyria or autoimmune disease   Allergies  Allergen Reactions  . Penicillins Hives, Rash and Other (See Comments)    And itching And itching  . Morphine And Related Nausea And Vomiting and Nausea Only  . Chlorzoxazone Other (See Comments)  . Ciprofloxacin Other (See Comments)  . Clarithromycin Other (See Comments)  . Clindamycin Hcl Other (See Comments)  . Codeine Sulfate Other (See Comments)  . Conjugated Estrogens Other (See Comments)  . Hydrocodone-Homatropine Other (See Comments)  . Hydromorphone Other (See Comments)  . Lidocaine Other (See Comments)  . Meperidine Other (See Comments)  . Propoxyphene Other (See Comments)  . Sulfa Antibiotics Other (See Comments)  . Tioconazole Other (See Comments)     REVIEW OF SYSTEMS (Negative unless checked)  Constitutional: [] Weight loss  [] Fever  [] Chills Cardiac: [] Chest pain   [] Chest pressure   [] Palpitations   [] Shortness of breath when laying flat   [] Shortness of breath with exertion.  Vascular:  [] Pain in legs with walking   [x] Pain in legs at rest  [] History of DVT   [] Phlebitis   [x] Swelling in legs   [x] Varicose veins   [] Non-healing ulcers Pulmonary:   [] Uses home oxygen   [] Productive cough   [] Hemoptysis   [] Wheeze  [] COPD   [] Asthma Neurologic:  [] Dizziness   [] Seizures   [] History of stroke   [] History of TIA  [] Aphasia   [] Vissual changes   [] Weakness or numbness in arm   [] Weakness or numbness in leg Musculoskeletal:   [] Joint swelling   [] Joint pain   [] Low back  pain Hematologic:  [] Easy bruising  [] Easy bleeding   [] Hypercoagulable state   [] Anemic Gastrointestinal:  [] Diarrhea   [] Vomiting  [] Gastroesophageal reflux/heartburn   [] Difficulty swallowing. Genitourinary:  [] Chronic kidney disease   [] Difficult urination  [] Frequent urination   [] Blood in urine Skin:  [] Rashes   [] Ulcers  Psychological:  [] History of anxiety   []  History of major depression.  Physical Examination  Vitals:   07/04/16 0858  BP: 126/65  Pulse: 86  Resp: 16  Weight: 211 lb (95.7 kg)  Height: 5' (1.524 m)   Body mass index is 41.21 kg/m. Gen: WD/WN, NAD Head: [place]/AT, No temporalis wasting.  Ear/Nose/Throat: Hearing grossly intact, nares w/o erythema or drainage, poor dentition Eyes: PER, EOMI, sclera nonicteric.  Neck: Supple, no masses.  No bruit or JVD.  Pulmonary:  Good air movement, clear to auscultation bilaterally, no use of accessory muscles.  Cardiac: RRR, normal S1, S2, no Murmurs. Vascular: scattered varicose veins bilaterally with mild to moderate venous changes of the ankles Vessel Right Left  Radial Palpable Palpable  Ulnar Palpable Palpable  Brachial Palpable Palpable  Carotid Palpable Palpable  PT Palpable Palpable  DP Palpable Palpable   Gastrointestinal: soft, non-distended. No guarding/no peritoneal signs.  Musculoskeletal: M/S 5/5 throughout.  No deformity or atrophy.  Neurologic: CN 2-12 intact. Pain and light touch intact in extremities.  Symmetrical.  Speech is fluent. Motor exam as listed above. Psychiatric: Judgment intact, Mood & affect appropriate for pt's clinical situation. Dermatologic: No rashes or ulcers noted.  No changes consistent with cellulitis. Lymph : No Cervical lymphadenopathy, no lichenification or skin changes of chronic lymphedema.  CBC Lab Results  Component Value Date   WBC 15.4 (H) 06/22/2013   HGB 12.7 06/22/2013   HCT 41.0 06/22/2013   MCV 82 06/22/2013   PLT 280 06/22/2013    BMET    Component  Value Date/Time   NA 139 06/22/2013 2023   K 3.9 06/22/2013 2023   CL 103 06/22/2013 2023   CO2 29 06/22/2013 2023   GLUCOSE 124 (H) 06/22/2013 2023   BUN 16 06/22/2013 2023   CREATININE 0.91 06/22/2013 2023   CALCIUM 9.3 06/22/2013 2023   GFRNONAA >60 06/22/2013 2023   GFRAA >60 06/22/2013 2023   CrCl cannot be calculated (Patient's most recent lab result is older than the maximum 21 days allowed.).  COAG No results found for: INR, PROTIME  Radiology Ct Lumbar Spine Wo Contrast  Result Date: 06/07/2016 CLINICAL DATA:  82 year old female bilateral lower back pain extending into hips for years. Symptoms worsening over the past 6 months. Bladder stimulating device in place. Initial encounter. EXAM: CT LUMBAR SPINE WITHOUT CONTRAST TECHNIQUE: Multidetector CT imaging of the lumbar spine was performed without intravenous contrast administration. Multiplanar CT image reconstructions were also generated. COMPARISON:  Intraoperative C-arm view 08/31/2010. CT abdomen pelvis 01/28/2010. Lumbar spine MR 08/29/2007. FINDINGS: Segmentation: Last fully  open disk space is labeled L5-S1. Present examination incorporates from T10-11 disc space through lower sacrum. Alignment: Scoliosis convex right thoracic- lumbar junction and convex left mid lumbar region. Vertebrae: No worrisome abnormality. Paraspinal and other soft tissues: Calcified plaque aorta and iliac arteries with ectasia. Disc levels: T10-11:  Negative. T11-12: Mild disc degeneration with vacuum disc. No significant spinal stenosis or foraminal narrowing. T12-L1:  Negative. L1-2: Moderate disc space narrowing with bulge and osteophyte greater left lateral position. No significant spinal stenosis or nerve root compression. L2-3: Small Schmorl's node deformity superior endplate L2. Moderate disc space narrowing. Bulge and small osteophyte. No significant spinal stenosis or foraminal narrowing. L3-4: Disc space narrowing with vacuum disc. Bulge. Facet  degenerative changes. Mild spinal stenosis. No significant foraminal narrowing. L4-5: Facet degenerative change greater on right. Minimal anterior slip/ rotation L4. Disc degeneration with vacuum disc. Bulge and osteophyte greater right lateral position without compression of the exiting nerve root. Mild lateral recess narrowing and spinal stenosis greater on the right. L5-S1: Facet degenerative changes. Bulge and osteophyte with lateral extension greater on the left crowding the exiting L5 nerve roots greater on the left. No significant spinal stenosis. Bilateral sacroiliac joint degenerative changes. Stimulating device extends through the left S3 sacral foramen. IMPRESSION: Scoliosis with superimposed degenerative changes most notable L5-S1 and L4-5 level as detailed above. No high-grade spinal stenosis noted. Bilateral sacroiliac joint degenerative changes. Stimulating device extends through the left S3 sacral foramen. Aortic atherosclerosis. Electronically Signed   By: Genia Del M.D.   On: 06/07/2016 15:18    Assessment/Plan 1. Chronic venous insufficiency No surgery or intervention at this point in time.    I have had a long discussion with the patient regarding venous insufficiency and why it  causes symptoms. I have discussed with the patient the chronic skin changes that accompany venous insufficiency and the long term sequela such as infection and ulceration.  Patient will begin wearing graduated compression stockings class 1 (20-30 mmHg) or compression wraps on a daily basis a prescription was given. The patient will put the stockings on first thing in the morning and removing them in the evening. The patient is instructed specifically not to sleep in the stockings.    In addition, behavioral modification including several periods of elevation of the lower extremities during the day will be continued. I have demonstrated that proper elevation is a position with the ankles at heart  level.  The patient is instructed to begin routine exercise, especially walking on a daily basis  Following the review of the ultrasound the patient will follow up in 2-3 months to reassess the degree of swelling and the control that graduated compression stockings or compression wraps  is offering.   The patient can be assessed for a Lymph Pump at that time  2. Lymphedema See #1  3. Asthma, unspecified asthma severity, unspecified whether complicated, unspecified whether persistent Continue pulmonary medications and aerosols as already ordered, these medications have been reviewed and there are no changes at this time.  4. Hyperlipidemia, unspecified hyperlipidemia type Continue statin as ordered and reviewed, no changes at this time   Hortencia Pilar, MD  07/04/2016 10:20 AM

## 2016-08-22 DIAGNOSIS — H903 Sensorineural hearing loss, bilateral: Secondary | ICD-10-CM | POA: Diagnosis not present

## 2016-08-22 DIAGNOSIS — H6123 Impacted cerumen, bilateral: Secondary | ICD-10-CM | POA: Diagnosis not present

## 2016-09-12 DIAGNOSIS — R05 Cough: Secondary | ICD-10-CM | POA: Diagnosis not present

## 2016-09-12 DIAGNOSIS — I8390 Asymptomatic varicose veins of unspecified lower extremity: Secondary | ICD-10-CM | POA: Diagnosis not present

## 2016-09-12 DIAGNOSIS — Z0000 Encounter for general adult medical examination without abnormal findings: Secondary | ICD-10-CM | POA: Diagnosis not present

## 2016-09-12 DIAGNOSIS — J4540 Moderate persistent asthma, uncomplicated: Secondary | ICD-10-CM | POA: Diagnosis not present

## 2016-09-12 DIAGNOSIS — R8290 Unspecified abnormal findings in urine: Secondary | ICD-10-CM | POA: Diagnosis not present

## 2016-09-12 DIAGNOSIS — M150 Primary generalized (osteo)arthritis: Secondary | ICD-10-CM | POA: Diagnosis not present

## 2016-09-12 DIAGNOSIS — M25572 Pain in left ankle and joints of left foot: Secondary | ICD-10-CM | POA: Diagnosis not present

## 2016-09-12 DIAGNOSIS — N3010 Interstitial cystitis (chronic) without hematuria: Secondary | ICD-10-CM | POA: Diagnosis not present

## 2016-09-12 DIAGNOSIS — Z6841 Body Mass Index (BMI) 40.0 and over, adult: Secondary | ICD-10-CM | POA: Diagnosis not present

## 2016-09-12 DIAGNOSIS — G8929 Other chronic pain: Secondary | ICD-10-CM | POA: Diagnosis not present

## 2016-09-12 DIAGNOSIS — F419 Anxiety disorder, unspecified: Secondary | ICD-10-CM | POA: Diagnosis not present

## 2016-09-12 DIAGNOSIS — M545 Low back pain: Secondary | ICD-10-CM | POA: Diagnosis not present

## 2016-09-19 DIAGNOSIS — J4530 Mild persistent asthma, uncomplicated: Secondary | ICD-10-CM | POA: Diagnosis not present

## 2016-09-19 DIAGNOSIS — F413 Other mixed anxiety disorders: Secondary | ICD-10-CM | POA: Diagnosis not present

## 2016-09-19 DIAGNOSIS — R05 Cough: Secondary | ICD-10-CM | POA: Diagnosis not present

## 2016-09-19 DIAGNOSIS — B952 Enterococcus as the cause of diseases classified elsewhere: Secondary | ICD-10-CM | POA: Diagnosis not present

## 2016-09-19 DIAGNOSIS — Z6841 Body Mass Index (BMI) 40.0 and over, adult: Secondary | ICD-10-CM | POA: Diagnosis not present

## 2016-09-19 DIAGNOSIS — N3010 Interstitial cystitis (chronic) without hematuria: Secondary | ICD-10-CM | POA: Diagnosis not present

## 2016-09-19 DIAGNOSIS — J449 Chronic obstructive pulmonary disease, unspecified: Secondary | ICD-10-CM | POA: Diagnosis not present

## 2016-09-19 DIAGNOSIS — M7062 Trochanteric bursitis, left hip: Secondary | ICD-10-CM | POA: Diagnosis not present

## 2016-09-19 DIAGNOSIS — N390 Urinary tract infection, site not specified: Secondary | ICD-10-CM | POA: Diagnosis not present

## 2016-09-19 DIAGNOSIS — M7061 Trochanteric bursitis, right hip: Secondary | ICD-10-CM | POA: Diagnosis not present

## 2016-09-19 DIAGNOSIS — M150 Primary generalized (osteo)arthritis: Secondary | ICD-10-CM | POA: Diagnosis not present

## 2016-10-05 ENCOUNTER — Encounter: Payer: Self-pay | Admitting: Dietician

## 2016-10-05 ENCOUNTER — Encounter: Payer: PPO | Attending: Internal Medicine | Admitting: Dietician

## 2016-10-05 VITALS — Ht 59.0 in | Wt 213.6 lb

## 2016-10-05 DIAGNOSIS — M150 Primary generalized (osteo)arthritis: Secondary | ICD-10-CM | POA: Insufficient documentation

## 2016-10-05 DIAGNOSIS — M47816 Spondylosis without myelopathy or radiculopathy, lumbar region: Secondary | ICD-10-CM

## 2016-10-05 DIAGNOSIS — Z713 Dietary counseling and surveillance: Secondary | ICD-10-CM | POA: Diagnosis not present

## 2016-10-05 DIAGNOSIS — Z6841 Body Mass Index (BMI) 40.0 and over, adult: Secondary | ICD-10-CM

## 2016-10-05 NOTE — Progress Notes (Signed)
Medical Nutrition Therapy: Visit start time: 1330  end time: 6962  Assessment:  Diagnosis: obesity, arthritis Past medical history: interstitial cystitis Psychosocial issues/ stress concerns: none Preferred learning method:  . Auditory . Visual  Current weight: 213.6lbs  Height: 4'11" Medications, supplements: reconciled list in medical record  Progress and evaluation: Patient reports gradually gaining weight over past 10 years. No previous significant weight loss. Has been working to improve food choices and portions, and feels she has begun to lose some weight. She avoids most sweets other than cookies but has worked to limit intake. She was involved with weight watchers program years ago, and remembers strategies learned in that program. She also follows diet for interstitial cycstits, such as avoiding chocolate, spicy foods, acidic foods, excess sugar.    Physical activity: pool exercises for 1 hour, 4 times a week + working with personal trainer 1 time a week.  Dietary Intake:  Usual eating pattern includes 3 meals and 1-2 snacks per day. Dining out frequency: 6-8 meals per week.  Breakfast: poached egg, 1 pc wheat toast, blueberries consistently Snack: none Lunch: today yellow squash, apple, few cheese doodles; sometimes small chili at [facility], often vegetables and fruit. Snack: about 3pm 7-8oz milk to boost protein and calcium intake, sometimes with peanut butter crackers or small portion nuts  Supper: usually out-- [employer] -- salad and small cup soup, or kids meal grilled chicken tenders and coleslaw; occasionally chicken and dumplings or red meat to boost iron intake. [employer]-- shares shrimp plate; cafeteria-- kids meal 1 meat and 1 veg; cutting board; [employer] steak[employer] or [employer]-- salad with some chicken or other protein.  Snack: eats crackers sometimes with peanut butter, peanuts, a cookie or snack mix to take meds Beverages: water only (no sugar or sweeteners due  to IC)  Nutrition Care Education: Topics covered: weight control, IC diet, arthritis Basic nutrition: basic food groups, appropriate nutrient balance, appropriate meal and snack schedule, general nutrition guidelines    Weight control: benefits of weight control, appropriate food portions, importance of adequate protein and options for adding protein to lunch meals; limiting added fats; benefits of tracking food intake.  IC diet: Discussed foods to avoid and foods that are tolerable. Arthritis: importance of vegetable and fruit intake, some whole grains, and lean proteins, and limiting sugar to decrease inflammation.  Nutritional Diagnosis:  [place]-2.1 Inpaired nutrition utilization As related to interstitial cystitis.  As evidenced by patient report. [place]-3.3 Overweight/obesity As related to history of excess calories, inactivity.  As evidenced by BMI 43, patient report.  Intervention: Instruction as noted above.   Set goals with direction from patient.   Patient is currently making overall good food choices, and is working on physical activity.   She declined scheduling MNT follow-up, but will schedule later if needed.  Education Materials given:  . Plate Planner . Food lists/ Planning A Balanced Meal . Sample menus-- Quick and Healthy Meal Ideas . Goals/ instructions  Learner/ who was taught:  . Patient   Level of understanding: Marland Kitchen Verbalizes/ demonstrates competency  Demonstrated degree of understanding via:   Teach back Learning barriers: . None  Willingness to learn/ readiness for change: . Eager, change in progress  Monitoring and Evaluation:  Dietary intake, exercise, arthritis symptoms, and body weight      follow up: prn

## 2016-10-05 NOTE — Patient Instructions (Signed)
   Continue with your healthy food choices, you are doing a great job!  Make sure to have a serving of a protein food with each meal (egg at breakfast, chicken/ cheese/ deli meat at lunch, lean meat at supper).  For a nighttime snack, have a portion of a starch such as crackers, graham crackers, or a piece of bread or fruit along with some protein such as lowfat cheese, peanut butter, or nuts. Yogurt is a combination of carb and protein and can also be a good snack.  Keep a food diary of everything you eat, including portions.   Keep up your regular exercise.

## 2016-10-12 DIAGNOSIS — R351 Nocturia: Secondary | ICD-10-CM | POA: Diagnosis not present

## 2016-10-12 DIAGNOSIS — R300 Dysuria: Secondary | ICD-10-CM | POA: Diagnosis not present

## 2016-12-19 DIAGNOSIS — M150 Primary generalized (osteo)arthritis: Secondary | ICD-10-CM | POA: Diagnosis not present

## 2016-12-19 DIAGNOSIS — M0579 Rheumatoid arthritis with rheumatoid factor of multiple sites without organ or systems involvement: Secondary | ICD-10-CM | POA: Diagnosis not present

## 2016-12-19 DIAGNOSIS — M545 Low back pain: Secondary | ICD-10-CM | POA: Diagnosis not present

## 2016-12-19 DIAGNOSIS — G8929 Other chronic pain: Secondary | ICD-10-CM | POA: Diagnosis not present

## 2016-12-30 DIAGNOSIS — M545 Low back pain: Secondary | ICD-10-CM | POA: Diagnosis not present

## 2016-12-30 DIAGNOSIS — M25552 Pain in left hip: Secondary | ICD-10-CM | POA: Diagnosis not present

## 2017-01-03 DIAGNOSIS — M25552 Pain in left hip: Secondary | ICD-10-CM | POA: Diagnosis not present

## 2017-01-03 DIAGNOSIS — M545 Low back pain: Secondary | ICD-10-CM | POA: Diagnosis not present

## 2017-01-13 DIAGNOSIS — M25552 Pain in left hip: Secondary | ICD-10-CM | POA: Diagnosis not present

## 2017-01-13 DIAGNOSIS — M545 Low back pain: Secondary | ICD-10-CM | POA: Diagnosis not present

## 2017-01-16 DIAGNOSIS — Z6841 Body Mass Index (BMI) 40.0 and over, adult: Secondary | ICD-10-CM | POA: Diagnosis not present

## 2017-01-16 DIAGNOSIS — N390 Urinary tract infection, site not specified: Secondary | ICD-10-CM | POA: Diagnosis not present

## 2017-01-16 DIAGNOSIS — R05 Cough: Secondary | ICD-10-CM | POA: Diagnosis not present

## 2017-01-16 DIAGNOSIS — F413 Other mixed anxiety disorders: Secondary | ICD-10-CM | POA: Diagnosis not present

## 2017-01-16 DIAGNOSIS — R8290 Unspecified abnormal findings in urine: Secondary | ICD-10-CM | POA: Diagnosis not present

## 2017-01-16 DIAGNOSIS — M150 Primary generalized (osteo)arthritis: Secondary | ICD-10-CM | POA: Diagnosis not present

## 2017-01-16 DIAGNOSIS — B952 Enterococcus as the cause of diseases classified elsewhere: Secondary | ICD-10-CM | POA: Diagnosis not present

## 2017-01-16 DIAGNOSIS — N3010 Interstitial cystitis (chronic) without hematuria: Secondary | ICD-10-CM | POA: Diagnosis not present

## 2017-01-16 DIAGNOSIS — J4530 Mild persistent asthma, uncomplicated: Secondary | ICD-10-CM | POA: Diagnosis not present

## 2017-01-17 DIAGNOSIS — M25552 Pain in left hip: Secondary | ICD-10-CM | POA: Diagnosis not present

## 2017-01-17 DIAGNOSIS — M545 Low back pain: Secondary | ICD-10-CM | POA: Diagnosis not present

## 2017-01-19 DIAGNOSIS — M25552 Pain in left hip: Secondary | ICD-10-CM | POA: Diagnosis not present

## 2017-01-19 DIAGNOSIS — M545 Low back pain: Secondary | ICD-10-CM | POA: Diagnosis not present

## 2017-01-23 DIAGNOSIS — N3010 Interstitial cystitis (chronic) without hematuria: Secondary | ICD-10-CM | POA: Diagnosis not present

## 2017-01-23 DIAGNOSIS — R05 Cough: Secondary | ICD-10-CM | POA: Diagnosis not present

## 2017-01-23 DIAGNOSIS — M150 Primary generalized (osteo)arthritis: Secondary | ICD-10-CM | POA: Diagnosis not present

## 2017-01-23 DIAGNOSIS — J4520 Mild intermittent asthma, uncomplicated: Secondary | ICD-10-CM | POA: Diagnosis not present

## 2017-01-23 DIAGNOSIS — J209 Acute bronchitis, unspecified: Secondary | ICD-10-CM | POA: Diagnosis not present

## 2017-01-23 DIAGNOSIS — R0609 Other forms of dyspnea: Secondary | ICD-10-CM | POA: Diagnosis not present

## 2017-01-23 DIAGNOSIS — Z6841 Body Mass Index (BMI) 40.0 and over, adult: Secondary | ICD-10-CM | POA: Diagnosis not present

## 2017-01-23 DIAGNOSIS — J45909 Unspecified asthma, uncomplicated: Secondary | ICD-10-CM | POA: Diagnosis not present

## 2017-01-30 ENCOUNTER — Other Ambulatory Visit: Payer: Self-pay | Admitting: Internal Medicine

## 2017-01-30 DIAGNOSIS — Z1231 Encounter for screening mammogram for malignant neoplasm of breast: Secondary | ICD-10-CM

## 2017-01-30 DIAGNOSIS — H1045 Other chronic allergic conjunctivitis: Secondary | ICD-10-CM | POA: Diagnosis not present

## 2017-01-31 DIAGNOSIS — M545 Low back pain: Secondary | ICD-10-CM | POA: Diagnosis not present

## 2017-01-31 DIAGNOSIS — M25552 Pain in left hip: Secondary | ICD-10-CM | POA: Diagnosis not present

## 2017-02-02 DIAGNOSIS — M545 Low back pain: Secondary | ICD-10-CM | POA: Diagnosis not present

## 2017-02-02 DIAGNOSIS — M25552 Pain in left hip: Secondary | ICD-10-CM | POA: Diagnosis not present

## 2017-02-22 DIAGNOSIS — M25552 Pain in left hip: Secondary | ICD-10-CM | POA: Diagnosis not present

## 2017-02-22 DIAGNOSIS — M545 Low back pain: Secondary | ICD-10-CM | POA: Diagnosis not present

## 2017-03-02 DIAGNOSIS — M25552 Pain in left hip: Secondary | ICD-10-CM | POA: Diagnosis not present

## 2017-03-02 DIAGNOSIS — M545 Low back pain: Secondary | ICD-10-CM | POA: Diagnosis not present

## 2017-03-09 ENCOUNTER — Ambulatory Visit: Payer: PPO

## 2017-03-16 ENCOUNTER — Ambulatory Visit
Admission: RE | Admit: 2017-03-16 | Discharge: 2017-03-16 | Disposition: A | Discharge status: Home / Self Care | Payer: PPO | Source: Ambulatory Visit | Attending: Internal Medicine | Admitting: Internal Medicine

## 2017-03-16 DIAGNOSIS — Z1231 Encounter for screening mammogram for malignant neoplasm of breast: Secondary | ICD-10-CM | POA: Insufficient documentation

## 2017-04-24 DIAGNOSIS — J449 Chronic obstructive pulmonary disease, unspecified: Secondary | ICD-10-CM | POA: Diagnosis not present

## 2017-04-24 DIAGNOSIS — J310 Chronic rhinitis: Secondary | ICD-10-CM | POA: Diagnosis not present

## 2017-04-24 DIAGNOSIS — R05 Cough: Secondary | ICD-10-CM | POA: Diagnosis not present

## 2017-05-08 DIAGNOSIS — H35372 Puckering of macula, left eye: Secondary | ICD-10-CM | POA: Diagnosis not present

## 2017-05-08 DIAGNOSIS — H35389 Toxic maculopathy, unspecified eye: Secondary | ICD-10-CM | POA: Diagnosis not present

## 2017-05-08 DIAGNOSIS — T372X5A Adverse effect of antimalarials and drugs acting on other blood protozoa, initial encounter: Secondary | ICD-10-CM | POA: Diagnosis not present

## 2017-05-16 DIAGNOSIS — M150 Primary generalized (osteo)arthritis: Secondary | ICD-10-CM | POA: Diagnosis not present

## 2017-05-16 DIAGNOSIS — J45909 Unspecified asthma, uncomplicated: Secondary | ICD-10-CM | POA: Diagnosis not present

## 2017-05-16 DIAGNOSIS — N3010 Interstitial cystitis (chronic) without hematuria: Secondary | ICD-10-CM | POA: Diagnosis not present

## 2017-05-16 DIAGNOSIS — R8290 Unspecified abnormal findings in urine: Secondary | ICD-10-CM | POA: Diagnosis not present

## 2017-05-16 DIAGNOSIS — R05 Cough: Secondary | ICD-10-CM | POA: Diagnosis not present

## 2017-05-23 DIAGNOSIS — J4520 Mild intermittent asthma, uncomplicated: Secondary | ICD-10-CM | POA: Diagnosis not present

## 2017-05-23 DIAGNOSIS — M150 Primary generalized (osteo)arthritis: Secondary | ICD-10-CM | POA: Diagnosis not present

## 2017-05-23 DIAGNOSIS — N393 Stress incontinence (female) (male): Secondary | ICD-10-CM | POA: Diagnosis not present

## 2017-05-23 DIAGNOSIS — N390 Urinary tract infection, site not specified: Secondary | ICD-10-CM | POA: Diagnosis not present

## 2017-05-23 DIAGNOSIS — M0579 Rheumatoid arthritis with rheumatoid factor of multiple sites without organ or systems involvement: Secondary | ICD-10-CM | POA: Diagnosis not present

## 2017-05-23 DIAGNOSIS — F419 Anxiety disorder, unspecified: Secondary | ICD-10-CM | POA: Diagnosis not present

## 2017-05-23 DIAGNOSIS — Z0000 Encounter for general adult medical examination without abnormal findings: Secondary | ICD-10-CM | POA: Diagnosis not present

## 2017-06-19 DIAGNOSIS — M0579 Rheumatoid arthritis with rheumatoid factor of multiple sites without organ or systems involvement: Secondary | ICD-10-CM | POA: Diagnosis not present

## 2017-06-19 DIAGNOSIS — G8929 Other chronic pain: Secondary | ICD-10-CM | POA: Diagnosis not present

## 2017-06-19 DIAGNOSIS — M150 Primary generalized (osteo)arthritis: Secondary | ICD-10-CM | POA: Diagnosis not present

## 2017-06-19 DIAGNOSIS — M545 Low back pain: Secondary | ICD-10-CM | POA: Diagnosis not present

## 2017-07-03 DIAGNOSIS — L821 Other seborrheic keratosis: Secondary | ICD-10-CM | POA: Diagnosis not present

## 2017-07-03 DIAGNOSIS — L578 Other skin changes due to chronic exposure to nonionizing radiation: Secondary | ICD-10-CM | POA: Diagnosis not present

## 2017-07-06 DIAGNOSIS — N3010 Interstitial cystitis (chronic) without hematuria: Secondary | ICD-10-CM | POA: Diagnosis not present

## 2017-07-06 DIAGNOSIS — M150 Primary generalized (osteo)arthritis: Secondary | ICD-10-CM | POA: Diagnosis not present

## 2017-07-06 DIAGNOSIS — M25551 Pain in right hip: Secondary | ICD-10-CM | POA: Diagnosis not present

## 2017-07-06 DIAGNOSIS — S79911A Unspecified injury of right hip, initial encounter: Secondary | ICD-10-CM | POA: Diagnosis not present

## 2017-07-06 DIAGNOSIS — M545 Low back pain: Secondary | ICD-10-CM | POA: Diagnosis not present

## 2017-07-06 DIAGNOSIS — G8929 Other chronic pain: Secondary | ICD-10-CM | POA: Diagnosis not present

## 2017-07-06 DIAGNOSIS — W19XXXA Unspecified fall, initial encounter: Secondary | ICD-10-CM | POA: Diagnosis not present

## 2017-07-06 DIAGNOSIS — R300 Dysuria: Secondary | ICD-10-CM | POA: Diagnosis not present

## 2017-07-06 DIAGNOSIS — J4520 Mild intermittent asthma, uncomplicated: Secondary | ICD-10-CM | POA: Diagnosis not present

## 2017-08-11 ENCOUNTER — Other Ambulatory Visit: Payer: Self-pay | Admitting: Internal Medicine

## 2017-08-11 DIAGNOSIS — R1011 Right upper quadrant pain: Secondary | ICD-10-CM | POA: Diagnosis not present

## 2017-08-11 DIAGNOSIS — N3010 Interstitial cystitis (chronic) without hematuria: Secondary | ICD-10-CM | POA: Diagnosis not present

## 2017-08-11 DIAGNOSIS — M150 Primary generalized (osteo)arthritis: Secondary | ICD-10-CM

## 2017-08-11 DIAGNOSIS — M159 Polyosteoarthritis, unspecified: Secondary | ICD-10-CM

## 2017-08-11 DIAGNOSIS — R0781 Pleurodynia: Secondary | ICD-10-CM | POA: Diagnosis not present

## 2017-08-11 DIAGNOSIS — R05 Cough: Secondary | ICD-10-CM

## 2017-08-11 DIAGNOSIS — R059 Cough, unspecified: Secondary | ICD-10-CM

## 2017-08-22 DIAGNOSIS — H6122 Impacted cerumen, left ear: Secondary | ICD-10-CM | POA: Diagnosis not present

## 2017-08-22 DIAGNOSIS — H903 Sensorineural hearing loss, bilateral: Secondary | ICD-10-CM | POA: Diagnosis not present

## 2017-09-06 DIAGNOSIS — R05 Cough: Secondary | ICD-10-CM | POA: Diagnosis not present

## 2017-09-06 DIAGNOSIS — J449 Chronic obstructive pulmonary disease, unspecified: Secondary | ICD-10-CM | POA: Diagnosis not present

## 2017-09-06 DIAGNOSIS — R0609 Other forms of dyspnea: Secondary | ICD-10-CM | POA: Diagnosis not present

## 2017-09-06 DIAGNOSIS — J310 Chronic rhinitis: Secondary | ICD-10-CM | POA: Diagnosis not present

## 2017-09-20 DIAGNOSIS — Z0000 Encounter for general adult medical examination without abnormal findings: Secondary | ICD-10-CM | POA: Diagnosis not present

## 2017-09-20 DIAGNOSIS — M0579 Rheumatoid arthritis with rheumatoid factor of multiple sites without organ or systems involvement: Secondary | ICD-10-CM | POA: Diagnosis not present

## 2017-09-20 DIAGNOSIS — F419 Anxiety disorder, unspecified: Secondary | ICD-10-CM | POA: Diagnosis not present

## 2017-09-20 DIAGNOSIS — M150 Primary generalized (osteo)arthritis: Secondary | ICD-10-CM | POA: Diagnosis not present

## 2017-09-20 DIAGNOSIS — E785 Hyperlipidemia, unspecified: Secondary | ICD-10-CM | POA: Diagnosis not present

## 2017-09-20 DIAGNOSIS — N390 Urinary tract infection, site not specified: Secondary | ICD-10-CM | POA: Diagnosis not present

## 2017-09-20 DIAGNOSIS — R8290 Unspecified abnormal findings in urine: Secondary | ICD-10-CM | POA: Diagnosis not present

## 2017-09-20 DIAGNOSIS — N393 Stress incontinence (female) (male): Secondary | ICD-10-CM | POA: Diagnosis not present

## 2017-09-20 DIAGNOSIS — J4520 Mild intermittent asthma, uncomplicated: Secondary | ICD-10-CM | POA: Diagnosis not present

## 2017-09-27 DIAGNOSIS — M150 Primary generalized (osteo)arthritis: Secondary | ICD-10-CM | POA: Diagnosis not present

## 2017-09-27 DIAGNOSIS — N3010 Interstitial cystitis (chronic) without hematuria: Secondary | ICD-10-CM | POA: Diagnosis not present

## 2017-09-27 DIAGNOSIS — Z6841 Body Mass Index (BMI) 40.0 and over, adult: Secondary | ICD-10-CM | POA: Diagnosis not present

## 2017-09-27 DIAGNOSIS — Z0000 Encounter for general adult medical examination without abnormal findings: Secondary | ICD-10-CM | POA: Diagnosis not present

## 2017-09-27 DIAGNOSIS — J4520 Mild intermittent asthma, uncomplicated: Secondary | ICD-10-CM | POA: Diagnosis not present

## 2017-09-27 DIAGNOSIS — R350 Frequency of micturition: Secondary | ICD-10-CM | POA: Diagnosis not present

## 2017-09-27 DIAGNOSIS — F419 Anxiety disorder, unspecified: Secondary | ICD-10-CM | POA: Diagnosis not present

## 2017-10-26 DIAGNOSIS — Z880 Allergy status to penicillin: Secondary | ICD-10-CM | POA: Diagnosis not present

## 2017-10-26 DIAGNOSIS — N393 Stress incontinence (female) (male): Secondary | ICD-10-CM | POA: Diagnosis not present

## 2017-10-26 DIAGNOSIS — Z7982 Long term (current) use of aspirin: Secondary | ICD-10-CM | POA: Diagnosis not present

## 2017-10-26 DIAGNOSIS — Z885 Allergy status to narcotic agent status: Secondary | ICD-10-CM | POA: Diagnosis not present

## 2017-10-26 DIAGNOSIS — N3010 Interstitial cystitis (chronic) without hematuria: Secondary | ICD-10-CM | POA: Diagnosis not present

## 2017-10-26 DIAGNOSIS — R350 Frequency of micturition: Secondary | ICD-10-CM | POA: Diagnosis not present

## 2017-10-26 DIAGNOSIS — N3941 Urge incontinence: Secondary | ICD-10-CM | POA: Diagnosis not present

## 2017-10-26 DIAGNOSIS — E785 Hyperlipidemia, unspecified: Secondary | ICD-10-CM | POA: Diagnosis not present

## 2017-10-26 DIAGNOSIS — Z79899 Other long term (current) drug therapy: Secondary | ICD-10-CM | POA: Diagnosis not present

## 2017-10-26 DIAGNOSIS — Z882 Allergy status to sulfonamides status: Secondary | ICD-10-CM | POA: Diagnosis not present

## 2017-10-26 DIAGNOSIS — R339 Retention of urine, unspecified: Secondary | ICD-10-CM | POA: Diagnosis not present

## 2017-10-26 DIAGNOSIS — Z7951 Long term (current) use of inhaled steroids: Secondary | ICD-10-CM | POA: Diagnosis not present

## 2017-12-19 DIAGNOSIS — M0579 Rheumatoid arthritis with rheumatoid factor of multiple sites without organ or systems involvement: Secondary | ICD-10-CM | POA: Diagnosis not present

## 2017-12-19 DIAGNOSIS — G8929 Other chronic pain: Secondary | ICD-10-CM | POA: Diagnosis not present

## 2017-12-19 DIAGNOSIS — M545 Low back pain: Secondary | ICD-10-CM | POA: Diagnosis not present

## 2017-12-19 DIAGNOSIS — M150 Primary generalized (osteo)arthritis: Secondary | ICD-10-CM | POA: Diagnosis not present

## 2017-12-21 DIAGNOSIS — J310 Chronic rhinitis: Secondary | ICD-10-CM | POA: Diagnosis not present

## 2017-12-21 DIAGNOSIS — R0609 Other forms of dyspnea: Secondary | ICD-10-CM | POA: Diagnosis not present

## 2017-12-21 DIAGNOSIS — R05 Cough: Secondary | ICD-10-CM | POA: Diagnosis not present

## 2017-12-21 DIAGNOSIS — J449 Chronic obstructive pulmonary disease, unspecified: Secondary | ICD-10-CM | POA: Diagnosis not present

## 2018-01-02 DIAGNOSIS — M7752 Other enthesopathy of left foot: Secondary | ICD-10-CM | POA: Diagnosis not present

## 2018-01-02 DIAGNOSIS — M25572 Pain in left ankle and joints of left foot: Secondary | ICD-10-CM | POA: Diagnosis not present

## 2018-01-16 DIAGNOSIS — R05 Cough: Secondary | ICD-10-CM | POA: Diagnosis not present

## 2018-01-16 DIAGNOSIS — M25572 Pain in left ankle and joints of left foot: Secondary | ICD-10-CM | POA: Diagnosis not present

## 2018-01-16 DIAGNOSIS — M7752 Other enthesopathy of left foot: Secondary | ICD-10-CM | POA: Diagnosis not present

## 2018-01-19 DIAGNOSIS — J301 Allergic rhinitis due to pollen: Secondary | ICD-10-CM | POA: Diagnosis not present

## 2018-01-22 DIAGNOSIS — F419 Anxiety disorder, unspecified: Secondary | ICD-10-CM | POA: Diagnosis not present

## 2018-01-22 DIAGNOSIS — M150 Primary generalized (osteo)arthritis: Secondary | ICD-10-CM | POA: Diagnosis not present

## 2018-01-22 DIAGNOSIS — E785 Hyperlipidemia, unspecified: Secondary | ICD-10-CM | POA: Diagnosis not present

## 2018-01-22 DIAGNOSIS — Z0000 Encounter for general adult medical examination without abnormal findings: Secondary | ICD-10-CM | POA: Diagnosis not present

## 2018-01-22 DIAGNOSIS — J4520 Mild intermittent asthma, uncomplicated: Secondary | ICD-10-CM | POA: Diagnosis not present

## 2018-01-22 DIAGNOSIS — N3010 Interstitial cystitis (chronic) without hematuria: Secondary | ICD-10-CM | POA: Diagnosis not present

## 2018-01-22 DIAGNOSIS — R8290 Unspecified abnormal findings in urine: Secondary | ICD-10-CM | POA: Diagnosis not present

## 2018-01-22 DIAGNOSIS — R350 Frequency of micturition: Secondary | ICD-10-CM | POA: Diagnosis not present

## 2018-01-22 DIAGNOSIS — Z6841 Body Mass Index (BMI) 40.0 and over, adult: Secondary | ICD-10-CM | POA: Diagnosis not present

## 2018-02-14 ENCOUNTER — Other Ambulatory Visit: Payer: Self-pay | Admitting: Internal Medicine

## 2018-02-14 DIAGNOSIS — Z1231 Encounter for screening mammogram for malignant neoplasm of breast: Secondary | ICD-10-CM

## 2018-02-16 DIAGNOSIS — J4520 Mild intermittent asthma, uncomplicated: Secondary | ICD-10-CM | POA: Diagnosis not present

## 2018-02-16 DIAGNOSIS — N393 Stress incontinence (female) (male): Secondary | ICD-10-CM | POA: Diagnosis not present

## 2018-02-16 DIAGNOSIS — F325 Major depressive disorder, single episode, in full remission: Secondary | ICD-10-CM | POA: Diagnosis not present

## 2018-02-16 DIAGNOSIS — M0579 Rheumatoid arthritis with rheumatoid factor of multiple sites without organ or systems involvement: Secondary | ICD-10-CM | POA: Diagnosis not present

## 2018-02-16 DIAGNOSIS — D649 Anemia, unspecified: Secondary | ICD-10-CM | POA: Diagnosis not present

## 2018-02-16 DIAGNOSIS — R05 Cough: Secondary | ICD-10-CM | POA: Diagnosis not present

## 2018-02-16 DIAGNOSIS — M150 Primary generalized (osteo)arthritis: Secondary | ICD-10-CM | POA: Diagnosis not present

## 2018-03-19 ENCOUNTER — Ambulatory Visit: Payer: PPO

## 2018-03-20 ENCOUNTER — Ambulatory Visit
Admission: RE | Admit: 2018-03-20 | Discharge: 2018-03-20 | Disposition: A | Discharge status: Home / Self Care | Payer: PPO | Source: Ambulatory Visit | Attending: Internal Medicine | Admitting: Internal Medicine

## 2018-03-20 DIAGNOSIS — Z1231 Encounter for screening mammogram for malignant neoplasm of breast: Secondary | ICD-10-CM | POA: Diagnosis not present

## 2018-03-25 ENCOUNTER — Ambulatory Visit (INDEPENDENT_AMBULATORY_CARE_PROVIDER_SITE_OTHER): Payer: PPO

## 2018-03-25 ENCOUNTER — Other Ambulatory Visit: Payer: Self-pay

## 2018-03-25 ENCOUNTER — Encounter: Payer: Self-pay | Admitting: Emergency Medicine

## 2018-03-25 ENCOUNTER — Ambulatory Visit
Admission: EM | Admit: 2018-03-25 | Discharge: 2018-03-25 | Disposition: A | Discharge status: Home / Self Care | Payer: PPO | Attending: Family Medicine | Admitting: Family Medicine

## 2018-03-25 DIAGNOSIS — Y92009 Unspecified place in unspecified non-institutional (private) residence as the place of occurrence of the external cause: Secondary | ICD-10-CM

## 2018-03-25 DIAGNOSIS — S42292A Other displaced fracture of upper end of left humerus, initial encounter for closed fracture: Secondary | ICD-10-CM | POA: Diagnosis not present

## 2018-03-25 DIAGNOSIS — S50312A Abrasion of left elbow, initial encounter: Secondary | ICD-10-CM

## 2018-03-25 DIAGNOSIS — M25522 Pain in left elbow: Secondary | ICD-10-CM | POA: Diagnosis not present

## 2018-03-25 DIAGNOSIS — S59902A Unspecified injury of left elbow, initial encounter: Secondary | ICD-10-CM | POA: Diagnosis not present

## 2018-03-25 DIAGNOSIS — W109XXA Fall (on) (from) unspecified stairs and steps, initial encounter: Secondary | ICD-10-CM | POA: Diagnosis not present

## 2018-03-25 DIAGNOSIS — W19XXXA Unspecified fall, initial encounter: Secondary | ICD-10-CM

## 2018-03-25 DIAGNOSIS — S42212A Unspecified displaced fracture of surgical neck of left humerus, initial encounter for closed fracture: Secondary | ICD-10-CM | POA: Diagnosis not present

## 2018-03-25 NOTE — ED Provider Notes (Addendum)
[facility] ____________________________________________  Time seen: Approximately 5:17 PM  I have reviewed the triage vital signs and the nursing notes.   HISTORY  Chief Complaint Fall; Elbow Pain; and Shoulder Pain   HPI Amber Kirk is a 84 y.o. female presenting with daughter-in-law at bedside for evaluation of left shoulder pain after injury that occurred at 230 this afternoon at her home.  Patient states that she had went outside to feed her birds, on the way, back in while stepping up on a small step she lost her balance causing her to fall directly to her left elbow.  States hit the left elbow on the concrete causing left shoulder pain.  States that she fell only due to the step.  Denies any chest pain, shortness of breath, syncope, near-syncope, head injury or loss of consciousness.  Right-hand-dominant.  States mild pain to left shoulder at this time.  Decreased range of motion to left shoulder.  No alleviating measures attempted other than cleaning wound to left elbow and topical Neosporin.  Reports otherwise feels well.  Denies other complaints.  Patient reports up-to-date on tetanus immunization.  [name], MD: PCP     Past Medical History:  Diagnosis Date  . Allergy   . Asthma     Patient Active Problem List   Diagnosis Date Noted  . Chronic venous insufficiency 07/04/2016  . Lymphedema 07/04/2016  . Asthma 07/04/2016  . Hyperlipidemia 07/04/2016    Past Surgical History:  Procedure Laterality Date  . ABDOMINAL HYSTERECTOMY    . APPENDECTOMY    . BLADDER SURGERY    . CHOLECYSTECTOMY    . TONSILLECTOMY       No current facility-administered medications for this encounter.   Current Outpatient Medications:  .  aspirin EC 81 MG tablet, Take 81 mg by mouth., Disp: , Rfl:  .  calcium carbonate (OS-CAL) 600 MG tablet, Take by mouth., Disp: , Rfl:  .  Cholecalciferol (VITAMIN D-1000 MAX ST) 1000 units tablet, Take by mouth.,  Disp: , Rfl:  .  diclofenac sodium (VOLTAREN) 1 % GEL, , Disp: , Rfl:  .  diphenhydrAMINE (BENADRYL) 25 mg capsule, Take by mouth., Disp: , Rfl:  .  doxepin (SINEQUAN) 25 MG capsule, , Disp: , Rfl:  .  ELMIRON 100 MG capsule, , Disp: , Rfl:  .  hydroxychloroquine (PLAQUENIL) 200 MG tablet, , Disp: , Rfl:  .  lovastatin (MEVACOR) 40 MG tablet, , Disp: , Rfl:  .  mometasone (NASONEX) 50 MCG/ACT nasal spray, Frequency:QD   Dosage:50   MCG  Instructions:  Note:Dose: 50MCG, Disp: , Rfl:  .  montelukast (SINGULAIR) 10 MG tablet, , Disp: , Rfl:  .  Multiple Vitamin (MULTI-VITAMINS) TABS, Take by mouth., Disp: , Rfl:  .  nitrofurantoin, macrocrystal-monohydrate, (MACROBID) 100 MG capsule, , Disp: , Rfl:  .  Olopatadine HCl 0.2 % SOLN, , Disp: , Rfl:  .  omeprazole (PRILOSEC) 20 MG capsule, , Disp: , Rfl:  .  pentosan polysulfate (ELMIRON) 100 MG capsule, , Disp: , Rfl:  .  SYMBICORT 160-4.5 MCG/ACT inhaler, , Disp: , Rfl:  .  albuterol (PROVENTIL) (2.5 MG/3ML) 0.083% nebulizer solution, , Disp: , Rfl:   Allergies Penicillins; Morphine and related; Chlorzoxazone; Ciprofloxacin; Clarithromycin; Clindamycin hcl; Codeine sulfate; Conjugated estrogens; Hydrocodone-homatropine; Hydromorphone; Lidocaine; Meperidine; Propoxyphene; Sulfa antibiotics; and Tioconazole  Family History  Problem Relation Age of Onset  . Heart disease Mother   . Stroke Father   . Breast cancer Neg Hx  Social History Social History   Tobacco Use  . Smoking status: Never Smoker  . Smokeless tobacco: Never Used  Substance Use Topics  . Alcohol use: No  . Drug use: No    Review of Systems Constitutional: No fever Cardiovascular: Denies chest pain. Respiratory: Denies shortness of breath. Gastrointestinal: No abdominal pain.  No nausea, no vomiting.   Musculoskeletal: Negative for back pain.  Positive left shoulder pain. Skin: Negative for rash. Neurological: Negative for headaches, focal weakness or  numbness.    ____________________________________________   PHYSICAL EXAM:  VITAL SIGNS: ED Triage Vitals  Enc Vitals Group     BP 03/25/18 1616 138/60     Pulse Rate 03/25/18 1616 88     Resp 03/25/18 1616 16     Temp 03/25/18 1616 97.6 F (36.4 C)     Temp Source 03/25/18 1616 Oral     SpO2 03/25/18 1616 94 %     Weight 03/25/18 1615 200 lb (90.7 kg)     Height 03/25/18 1615 5\' 2"  (1.575 m)     Head Circumference --      Peak Flow --      Pain Score 03/25/18 1615 8     Pain Loc --      Pain Edu? --      Excl. in [place]? --     Constitutional: Alert and oriented. Well appearing and in no acute distress. Eyes: Conjunctivae are normal.. ENT      Head: Normocephalic and atraumatic. Cardiovascular: Normal rate, regular rhythm. Grossly normal heart sounds.  Good peripheral circulation. Respiratory: Normal respiratory effort without tachypnea nor retractions. Breath sounds are clear and equal bilaterally. No wheezes, rales, rhonchi. Gastrointestinal: Soft and nontender. Musculoskeletal:   No midline cervical, thoracic or lumbar tenderness to palpation.  No chest or torso tenderness to palpation.  Left elbow minimal tenderness to palpation at lower elbow where abrasion present, no foreign body, no drainage, no erythema, wound appears clean, no repair indicated.  Elbow with full range of motion.  Left proximal humerus moderate tenderness to direct palpation with visualized deformity, unable to abduct, held in adduction.  Bilateral hand grip strong and equal, normal distal sensation to bilateral hands and bilateral distal radial pulses equal. Neurologic:  Normal speech and language. No gross focal neurologic deficits are appreciated. Speech is normal. No gait instability.  Skin:  Skin is warm, dry.  Left elbow abrasion Psychiatric: Mood and affect are normal. Speech and behavior are normal. Patient exhibits appropriate insight and judgment   ___________________________________________    LABS (all labs ordered are listed, but only abnormal results are displayed)  Labs Reviewed - No data to display ____________________________________________  RADIOLOGY  Dg Elbow Complete Left  Result Date: 03/25/2018 CLINICAL DATA:  84 year old female status post fall with pain. EXAM: LEFT ELBOW - COMPLETE 3+ VIEW COMPARISON:  Left shoulder and humerus series today. FINDINGS: Bone mineralization is within normal limits for age. There is no evidence of fracture, dislocation, or joint effusion. There is no evidence of arthropathy or other focal bone abnormality. Soft tissues are unremarkable. IMPRESSION: Negative. Electronically Signed   By: Genevie Ann M.D.   On: 03/25/2018 16:26   Dg Shoulder Left  Result Date: 03/25/2018 CLINICAL DATA:  84 year old female status post fall with pain. EXAM: LEFT SHOULDER - 2+ VIEW COMPARISON:  None available. FINDINGS: There is an impacted fracture of the proximal left humerus which appears mildly comminuted. Fractures through the humeral neck. The left humeral head appears to remain  normally aligned with the glenoid. No left scapula or clavicle fracture identified. Negative visible left chest. IMPRESSION: Impacted and probably mildly comminuted fracture of the left humeral neck. Electronically Signed   By: Genevie Ann M.D.   On: 03/25/2018 16:24   Dg Humerus Left  Result Date: 03/25/2018 CLINICAL DATA:  84 year old female status post fall with pain. EXAM: LEFT HUMERUS - 2+ VIEW COMPARISON:  Left shoulder series today. FINDINGS: Proximal left humerus fracture with impaction and probable mild comminution redemonstrated. The more distal left humerus appears intact. Visible left ribs, scapula and clavicle appear intact. IMPRESSION: 1. Proximal left humerus fracture, see shoulder series. 2. The mid and distal left humerus appear intact. Electronically Signed   By: Genevie Ann M.D.   On: 03/25/2018 16:26   ____________________________________________   PROCEDURES Procedures    Sling applied.  INITIAL IMPRESSION / ASSESSMENT AND PLAN / ED COURSE  Pertinent labs & imaging results that were available during my care of the patient were reviewed by me and considered in my medical decision making (see chart for details).  Well-appearing patient.  No acute distress.  Family at bedside.  Presenting for evaluation of left shoulder pain post mechanical injury that occurred prior to arrival.  Denies other complaints.  Imaging obtained as above, positive for left humeral neck fracture with mild comminution and impaction.  Sling applied.  Discussed keeping in sling, ice and supportive care.  Patient with numerous medication allergies.  Patient states she is unable to tolerate pain medication except for tramadol and she reports she does have tramadol at home if needed.  Discussed taking tramadol twice a day as needed and intermittent Tylenol.  Follow-up with orthopedic in 2 to 3 days, previously seen Dr. Roland Rack, information for same given.  Call tomorrow.  Discussed follow up with Primary care physician this week. Discussed follow up and return parameters including no resolution or any worsening concerns. Patient verbalized understanding and agreed to plan.   ____________________________________________   FINAL CLINICAL IMPRESSION(S) / ED DIAGNOSES  Final diagnoses:  Closed fracture of neck of left humerus, initial encounter  Abrasion of left elbow, initial encounter  Fall, initial encounter     ED Discharge Orders    None       Note: This dictation was prepared with Dragon dictation along with smaller phrase technology. Any transcriptional errors that result from this process are unintentional.         Marylene Land, NP 03/25/18 1723    Marylene Land, NP 03/25/18 1724

## 2018-03-25 NOTE — ED Triage Notes (Signed)
Patient states that she fell around 2:30pm outside her house.  Patient states that she fell on her left shoulder and left arm.  Patient c/o left shoulder pain down her left arm.

## 2018-03-25 NOTE — Discharge Instructions (Addendum)
Keep in sling. Ice. Home medication as needed.   Follow-up with orthopedic in the next 3 days.  See above to call to schedule.  Follow up with your primary care physician this week as needed. Return to Urgent care for new or worsening concerns.

## 2018-03-29 DIAGNOSIS — S42292A Other displaced fracture of upper end of left humerus, initial encounter for closed fracture: Secondary | ICD-10-CM | POA: Diagnosis not present

## 2018-03-29 DIAGNOSIS — Z6841 Body Mass Index (BMI) 40.0 and over, adult: Secondary | ICD-10-CM | POA: Insufficient documentation

## 2018-03-29 DIAGNOSIS — N390 Urinary tract infection, site not specified: Secondary | ICD-10-CM | POA: Diagnosis not present

## 2018-03-29 DIAGNOSIS — M25512 Pain in left shoulder: Secondary | ICD-10-CM | POA: Diagnosis not present

## 2018-04-05 DIAGNOSIS — S42292D Other displaced fracture of upper end of left humerus, subsequent encounter for fracture with routine healing: Secondary | ICD-10-CM | POA: Diagnosis not present

## 2018-04-05 DIAGNOSIS — G8929 Other chronic pain: Secondary | ICD-10-CM | POA: Diagnosis not present

## 2018-04-05 DIAGNOSIS — Z6841 Body Mass Index (BMI) 40.0 and over, adult: Secondary | ICD-10-CM | POA: Diagnosis not present

## 2018-04-05 DIAGNOSIS — M25512 Pain in left shoulder: Secondary | ICD-10-CM | POA: Diagnosis not present

## 2018-04-10 ENCOUNTER — Other Ambulatory Visit: Payer: Self-pay | Admitting: *Deleted

## 2018-04-10 NOTE — Patient Outreach (Signed)
[facility]) Care Management  04/10/2018  [place] 1934/04/19 [id number]   Telephone Screen  Referral Date: 04/09/2018 Referral Source: [employer] concierge referral  Referral Reason: member broke arm needs assistance with bathing, clothing, etc custodial care, recent discharge from inpatient or emergency room setting within 72 hours  Insurance: [employer]TA 03/25/2018 fall injury to left elbow, shoulder went to [facility] urgent care   Outreach attempt # 1  Patient is able to verify HIPAA Reviewed and addressed referral to [facility] with patient Have someone already and she states she appreciates the call to assist her  She now has home health services   Social: Mrs Fazzini lives alone and reports she has just found some one to assist her in her home with care needs    Conditions: closed fx of neck of left humerus, abrasion of left elbow, fall on 03/25/2018 asthma, GERD, HDL   Medications: denies concerns with taking medications as prescribed, affording medications, side effects of medications and questions about medications    Appointments: 04/19/2018 orthopedic Dr Leanor Kail 04/24/2018 pulmonology Dr Wallene Huh 4/6 and 06/18/2018 Dr Ginette Pitman primary MD 06/21/2018 Rheumatology Dr Percell Boston 09/27/2018 urology Dr Sherryll Burger     Advance Directives:Has a living will and POA son   Consent: [facility] RN CM reviewed [facility] services with patient. Patient gave verbal consent for services.  Plan: [facility] RN CM will close case at this time as patient has been assessed and no needs identified/needs resolved.   Pt encouraged to return a call to [place] CM prn  [facility] RN CM sent a successful outreach letter as discussed with [facility] brochure enclosed for review   Jaeshawn Silvio L. Lavina Hamman, RN, BSN, [place] Coordinator Office number [phone] Mobile number [phone]  Main [facility] number [phone] Fax number [phone]

## 2018-04-11 DIAGNOSIS — R8290 Unspecified abnormal findings in urine: Secondary | ICD-10-CM | POA: Diagnosis not present

## 2018-04-11 DIAGNOSIS — R399 Unspecified symptoms and signs involving the genitourinary system: Secondary | ICD-10-CM | POA: Diagnosis not present

## 2018-04-19 DIAGNOSIS — Z6841 Body Mass Index (BMI) 40.0 and over, adult: Secondary | ICD-10-CM | POA: Diagnosis not present

## 2018-04-19 DIAGNOSIS — S42292D Other displaced fracture of upper end of left humerus, subsequent encounter for fracture with routine healing: Secondary | ICD-10-CM | POA: Diagnosis not present

## 2018-04-24 DIAGNOSIS — R0609 Other forms of dyspnea: Secondary | ICD-10-CM | POA: Diagnosis not present

## 2018-04-24 DIAGNOSIS — J449 Chronic obstructive pulmonary disease, unspecified: Secondary | ICD-10-CM | POA: Diagnosis not present

## 2018-05-03 DIAGNOSIS — Z6841 Body Mass Index (BMI) 40.0 and over, adult: Secondary | ICD-10-CM | POA: Diagnosis not present

## 2018-05-03 DIAGNOSIS — S42292D Other displaced fracture of upper end of left humerus, subsequent encounter for fracture with routine healing: Secondary | ICD-10-CM | POA: Diagnosis not present

## 2018-05-09 DIAGNOSIS — H35372 Puckering of macula, left eye: Secondary | ICD-10-CM | POA: Diagnosis not present

## 2018-05-09 DIAGNOSIS — T372X5A Adverse effect of antimalarials and drugs acting on other blood protozoa, initial encounter: Secondary | ICD-10-CM | POA: Diagnosis not present

## 2018-05-09 DIAGNOSIS — H35389 Toxic maculopathy, unspecified eye: Secondary | ICD-10-CM | POA: Diagnosis not present

## 2018-05-14 DIAGNOSIS — M25512 Pain in left shoulder: Secondary | ICD-10-CM | POA: Diagnosis not present

## 2018-05-14 DIAGNOSIS — S42202A Unspecified fracture of upper end of left humerus, initial encounter for closed fracture: Secondary | ICD-10-CM | POA: Diagnosis not present

## 2018-05-16 DIAGNOSIS — M6281 Muscle weakness (generalized): Secondary | ICD-10-CM | POA: Diagnosis not present

## 2018-05-16 DIAGNOSIS — M25512 Pain in left shoulder: Secondary | ICD-10-CM | POA: Diagnosis not present

## 2018-05-16 DIAGNOSIS — M25612 Stiffness of left shoulder, not elsewhere classified: Secondary | ICD-10-CM | POA: Diagnosis not present

## 2018-06-11 DIAGNOSIS — J4520 Mild intermittent asthma, uncomplicated: Secondary | ICD-10-CM | POA: Diagnosis not present

## 2018-06-11 DIAGNOSIS — F325 Major depressive disorder, single episode, in full remission: Secondary | ICD-10-CM | POA: Diagnosis not present

## 2018-06-11 DIAGNOSIS — E785 Hyperlipidemia, unspecified: Secondary | ICD-10-CM | POA: Diagnosis not present

## 2018-06-11 DIAGNOSIS — M0579 Rheumatoid arthritis with rheumatoid factor of multiple sites without organ or systems involvement: Secondary | ICD-10-CM | POA: Diagnosis not present

## 2018-06-11 DIAGNOSIS — N393 Stress incontinence (female) (male): Secondary | ICD-10-CM | POA: Diagnosis not present

## 2018-06-11 DIAGNOSIS — M150 Primary generalized (osteo)arthritis: Secondary | ICD-10-CM | POA: Diagnosis not present

## 2018-06-11 DIAGNOSIS — R05 Cough: Secondary | ICD-10-CM | POA: Diagnosis not present

## 2018-06-11 DIAGNOSIS — D649 Anemia, unspecified: Secondary | ICD-10-CM | POA: Diagnosis not present

## 2018-06-13 DIAGNOSIS — M19071 Primary osteoarthritis, right ankle and foot: Secondary | ICD-10-CM | POA: Diagnosis not present

## 2018-06-13 DIAGNOSIS — J849 Interstitial pulmonary disease, unspecified: Secondary | ICD-10-CM | POA: Diagnosis not present

## 2018-06-13 DIAGNOSIS — M19079 Primary osteoarthritis, unspecified ankle and foot: Secondary | ICD-10-CM | POA: Insufficient documentation

## 2018-06-13 DIAGNOSIS — S61411D Laceration without foreign body of right hand, subsequent encounter: Secondary | ICD-10-CM | POA: Diagnosis not present

## 2018-06-20 DIAGNOSIS — Z6841 Body Mass Index (BMI) 40.0 and over, adult: Secondary | ICD-10-CM | POA: Diagnosis not present

## 2018-06-20 DIAGNOSIS — R82998 Other abnormal findings in urine: Secondary | ICD-10-CM | POA: Diagnosis not present

## 2018-06-20 DIAGNOSIS — F413 Other mixed anxiety disorders: Secondary | ICD-10-CM | POA: Diagnosis not present

## 2018-06-20 DIAGNOSIS — F419 Anxiety disorder, unspecified: Secondary | ICD-10-CM | POA: Diagnosis not present

## 2018-06-20 DIAGNOSIS — M150 Primary generalized (osteo)arthritis: Secondary | ICD-10-CM | POA: Diagnosis not present

## 2018-06-20 DIAGNOSIS — N393 Stress incontinence (female) (male): Secondary | ICD-10-CM | POA: Diagnosis not present

## 2018-06-20 DIAGNOSIS — D649 Anemia, unspecified: Secondary | ICD-10-CM | POA: Diagnosis not present

## 2018-06-20 DIAGNOSIS — Z0000 Encounter for general adult medical examination without abnormal findings: Secondary | ICD-10-CM | POA: Diagnosis not present

## 2018-06-21 DIAGNOSIS — M1990 Unspecified osteoarthritis, unspecified site: Secondary | ICD-10-CM | POA: Diagnosis not present

## 2018-06-21 DIAGNOSIS — M0579 Rheumatoid arthritis with rheumatoid factor of multiple sites without organ or systems involvement: Secondary | ICD-10-CM | POA: Diagnosis not present

## 2018-07-02 DIAGNOSIS — M19011 Primary osteoarthritis, right shoulder: Secondary | ICD-10-CM | POA: Diagnosis not present

## 2018-07-13 DIAGNOSIS — M25512 Pain in left shoulder: Secondary | ICD-10-CM | POA: Insufficient documentation

## 2018-07-13 DIAGNOSIS — M25612 Stiffness of left shoulder, not elsewhere classified: Secondary | ICD-10-CM | POA: Diagnosis not present

## 2018-07-16 DIAGNOSIS — M25612 Stiffness of left shoulder, not elsewhere classified: Secondary | ICD-10-CM | POA: Diagnosis not present

## 2018-07-16 DIAGNOSIS — M25512 Pain in left shoulder: Secondary | ICD-10-CM | POA: Diagnosis not present

## 2018-07-18 DIAGNOSIS — M25612 Stiffness of left shoulder, not elsewhere classified: Secondary | ICD-10-CM | POA: Diagnosis not present

## 2018-07-18 DIAGNOSIS — M25512 Pain in left shoulder: Secondary | ICD-10-CM | POA: Diagnosis not present

## 2018-07-23 DIAGNOSIS — M25512 Pain in left shoulder: Secondary | ICD-10-CM | POA: Diagnosis not present

## 2018-07-23 DIAGNOSIS — M25612 Stiffness of left shoulder, not elsewhere classified: Secondary | ICD-10-CM | POA: Diagnosis not present

## 2018-07-26 DIAGNOSIS — M25612 Stiffness of left shoulder, not elsewhere classified: Secondary | ICD-10-CM | POA: Diagnosis not present

## 2018-07-26 DIAGNOSIS — M25512 Pain in left shoulder: Secondary | ICD-10-CM | POA: Diagnosis not present

## 2018-07-31 DIAGNOSIS — M25612 Stiffness of left shoulder, not elsewhere classified: Secondary | ICD-10-CM | POA: Diagnosis not present

## 2018-07-31 DIAGNOSIS — M25512 Pain in left shoulder: Secondary | ICD-10-CM | POA: Diagnosis not present

## 2018-08-03 DIAGNOSIS — M25612 Stiffness of left shoulder, not elsewhere classified: Secondary | ICD-10-CM | POA: Diagnosis not present

## 2018-08-03 DIAGNOSIS — M25512 Pain in left shoulder: Secondary | ICD-10-CM | POA: Diagnosis not present

## 2018-08-06 DIAGNOSIS — M25612 Stiffness of left shoulder, not elsewhere classified: Secondary | ICD-10-CM | POA: Diagnosis not present

## 2018-08-06 DIAGNOSIS — M25512 Pain in left shoulder: Secondary | ICD-10-CM | POA: Diagnosis not present

## 2018-08-13 DIAGNOSIS — M25512 Pain in left shoulder: Secondary | ICD-10-CM | POA: Diagnosis not present

## 2018-08-13 DIAGNOSIS — M25612 Stiffness of left shoulder, not elsewhere classified: Secondary | ICD-10-CM | POA: Diagnosis not present

## 2018-08-14 DIAGNOSIS — M25612 Stiffness of left shoulder, not elsewhere classified: Secondary | ICD-10-CM | POA: Diagnosis not present

## 2018-08-14 DIAGNOSIS — S42202A Unspecified fracture of upper end of left humerus, initial encounter for closed fracture: Secondary | ICD-10-CM | POA: Diagnosis not present

## 2018-08-14 DIAGNOSIS — M25512 Pain in left shoulder: Secondary | ICD-10-CM | POA: Diagnosis not present

## 2018-08-15 DIAGNOSIS — M25612 Stiffness of left shoulder, not elsewhere classified: Secondary | ICD-10-CM | POA: Diagnosis not present

## 2018-08-15 DIAGNOSIS — M25512 Pain in left shoulder: Secondary | ICD-10-CM | POA: Diagnosis not present

## 2018-08-21 DIAGNOSIS — M25512 Pain in left shoulder: Secondary | ICD-10-CM | POA: Diagnosis not present

## 2018-08-21 DIAGNOSIS — M25612 Stiffness of left shoulder, not elsewhere classified: Secondary | ICD-10-CM | POA: Diagnosis not present

## 2018-08-22 DIAGNOSIS — H6123 Impacted cerumen, bilateral: Secondary | ICD-10-CM | POA: Diagnosis not present

## 2018-08-22 DIAGNOSIS — H903 Sensorineural hearing loss, bilateral: Secondary | ICD-10-CM | POA: Diagnosis not present

## 2018-08-23 DIAGNOSIS — M25512 Pain in left shoulder: Secondary | ICD-10-CM | POA: Diagnosis not present

## 2018-08-23 DIAGNOSIS — J439 Emphysema, unspecified: Secondary | ICD-10-CM | POA: Diagnosis not present

## 2018-08-23 DIAGNOSIS — M25612 Stiffness of left shoulder, not elsewhere classified: Secondary | ICD-10-CM | POA: Diagnosis not present

## 2018-09-27 DIAGNOSIS — R339 Retention of urine, unspecified: Secondary | ICD-10-CM | POA: Diagnosis not present

## 2018-10-03 DIAGNOSIS — R351 Nocturia: Secondary | ICD-10-CM | POA: Diagnosis not present

## 2018-10-15 DIAGNOSIS — M5136 Other intervertebral disc degeneration, lumbar region: Secondary | ICD-10-CM | POA: Diagnosis not present

## 2018-10-15 DIAGNOSIS — M9903 Segmental and somatic dysfunction of lumbar region: Secondary | ICD-10-CM | POA: Diagnosis not present

## 2018-10-15 DIAGNOSIS — M9902 Segmental and somatic dysfunction of thoracic region: Secondary | ICD-10-CM | POA: Diagnosis not present

## 2018-10-15 DIAGNOSIS — M62830 Muscle spasm of back: Secondary | ICD-10-CM | POA: Diagnosis not present

## 2018-10-17 DIAGNOSIS — E785 Hyperlipidemia, unspecified: Secondary | ICD-10-CM | POA: Diagnosis not present

## 2018-10-17 DIAGNOSIS — Z6841 Body Mass Index (BMI) 40.0 and over, adult: Secondary | ICD-10-CM | POA: Diagnosis not present

## 2018-10-17 DIAGNOSIS — F413 Other mixed anxiety disorders: Secondary | ICD-10-CM | POA: Diagnosis not present

## 2018-10-17 DIAGNOSIS — D649 Anemia, unspecified: Secondary | ICD-10-CM | POA: Diagnosis not present

## 2018-10-17 DIAGNOSIS — F419 Anxiety disorder, unspecified: Secondary | ICD-10-CM | POA: Diagnosis not present

## 2018-10-17 DIAGNOSIS — M8949 Other hypertrophic osteoarthropathy, multiple sites: Secondary | ICD-10-CM | POA: Diagnosis not present

## 2018-10-17 DIAGNOSIS — R82998 Other abnormal findings in urine: Secondary | ICD-10-CM | POA: Diagnosis not present

## 2018-10-18 DIAGNOSIS — M5136 Other intervertebral disc degeneration, lumbar region: Secondary | ICD-10-CM | POA: Diagnosis not present

## 2018-10-18 DIAGNOSIS — D649 Anemia, unspecified: Secondary | ICD-10-CM | POA: Diagnosis not present

## 2018-10-18 DIAGNOSIS — M8949 Other hypertrophic osteoarthropathy, multiple sites: Secondary | ICD-10-CM | POA: Diagnosis not present

## 2018-10-18 DIAGNOSIS — R82998 Other abnormal findings in urine: Secondary | ICD-10-CM | POA: Diagnosis not present

## 2018-10-18 DIAGNOSIS — M9902 Segmental and somatic dysfunction of thoracic region: Secondary | ICD-10-CM | POA: Diagnosis not present

## 2018-10-18 DIAGNOSIS — R8290 Unspecified abnormal findings in urine: Secondary | ICD-10-CM | POA: Diagnosis not present

## 2018-10-18 DIAGNOSIS — F419 Anxiety disorder, unspecified: Secondary | ICD-10-CM | POA: Diagnosis not present

## 2018-10-18 DIAGNOSIS — M62830 Muscle spasm of back: Secondary | ICD-10-CM | POA: Diagnosis not present

## 2018-10-18 DIAGNOSIS — Z6841 Body Mass Index (BMI) 40.0 and over, adult: Secondary | ICD-10-CM | POA: Diagnosis not present

## 2018-10-18 DIAGNOSIS — M9903 Segmental and somatic dysfunction of lumbar region: Secondary | ICD-10-CM | POA: Diagnosis not present

## 2018-10-18 DIAGNOSIS — F413 Other mixed anxiety disorders: Secondary | ICD-10-CM | POA: Diagnosis not present

## 2018-10-22 DIAGNOSIS — M9903 Segmental and somatic dysfunction of lumbar region: Secondary | ICD-10-CM | POA: Diagnosis not present

## 2018-10-22 DIAGNOSIS — M5136 Other intervertebral disc degeneration, lumbar region: Secondary | ICD-10-CM | POA: Diagnosis not present

## 2018-10-22 DIAGNOSIS — M9902 Segmental and somatic dysfunction of thoracic region: Secondary | ICD-10-CM | POA: Diagnosis not present

## 2018-10-22 DIAGNOSIS — M62830 Muscle spasm of back: Secondary | ICD-10-CM | POA: Diagnosis not present

## 2018-10-23 DIAGNOSIS — M79671 Pain in right foot: Secondary | ICD-10-CM | POA: Diagnosis not present

## 2018-10-24 DIAGNOSIS — N390 Urinary tract infection, site not specified: Secondary | ICD-10-CM | POA: Diagnosis not present

## 2018-10-24 DIAGNOSIS — N3010 Interstitial cystitis (chronic) without hematuria: Secondary | ICD-10-CM | POA: Diagnosis not present

## 2018-10-24 DIAGNOSIS — Z6841 Body Mass Index (BMI) 40.0 and over, adult: Secondary | ICD-10-CM | POA: Diagnosis not present

## 2018-10-24 DIAGNOSIS — K219 Gastro-esophageal reflux disease without esophagitis: Secondary | ICD-10-CM | POA: Diagnosis not present

## 2018-10-24 DIAGNOSIS — M159 Polyosteoarthritis, unspecified: Secondary | ICD-10-CM | POA: Insufficient documentation

## 2018-10-24 DIAGNOSIS — Z0000 Encounter for general adult medical examination without abnormal findings: Secondary | ICD-10-CM | POA: Diagnosis not present

## 2018-10-24 DIAGNOSIS — F3340 Major depressive disorder, recurrent, in remission, unspecified: Secondary | ICD-10-CM | POA: Diagnosis not present

## 2018-10-24 DIAGNOSIS — J4520 Mild intermittent asthma, uncomplicated: Secondary | ICD-10-CM | POA: Diagnosis not present

## 2018-10-25 DIAGNOSIS — M62830 Muscle spasm of back: Secondary | ICD-10-CM | POA: Diagnosis not present

## 2018-10-25 DIAGNOSIS — M9903 Segmental and somatic dysfunction of lumbar region: Secondary | ICD-10-CM | POA: Diagnosis not present

## 2018-10-25 DIAGNOSIS — M5136 Other intervertebral disc degeneration, lumbar region: Secondary | ICD-10-CM | POA: Diagnosis not present

## 2018-10-25 DIAGNOSIS — M9902 Segmental and somatic dysfunction of thoracic region: Secondary | ICD-10-CM | POA: Diagnosis not present

## 2018-10-29 DIAGNOSIS — M9902 Segmental and somatic dysfunction of thoracic region: Secondary | ICD-10-CM | POA: Diagnosis not present

## 2018-10-29 DIAGNOSIS — M5136 Other intervertebral disc degeneration, lumbar region: Secondary | ICD-10-CM | POA: Diagnosis not present

## 2018-10-29 DIAGNOSIS — M62830 Muscle spasm of back: Secondary | ICD-10-CM | POA: Diagnosis not present

## 2018-10-29 DIAGNOSIS — M9903 Segmental and somatic dysfunction of lumbar region: Secondary | ICD-10-CM | POA: Diagnosis not present

## 2018-11-01 DIAGNOSIS — M9902 Segmental and somatic dysfunction of thoracic region: Secondary | ICD-10-CM | POA: Diagnosis not present

## 2018-11-01 DIAGNOSIS — M62830 Muscle spasm of back: Secondary | ICD-10-CM | POA: Diagnosis not present

## 2018-11-01 DIAGNOSIS — M9903 Segmental and somatic dysfunction of lumbar region: Secondary | ICD-10-CM | POA: Diagnosis not present

## 2018-11-01 DIAGNOSIS — M5136 Other intervertebral disc degeneration, lumbar region: Secondary | ICD-10-CM | POA: Diagnosis not present

## 2018-11-06 DIAGNOSIS — D2371 Other benign neoplasm of skin of right lower limb, including hip: Secondary | ICD-10-CM | POA: Diagnosis not present

## 2018-11-06 DIAGNOSIS — L97511 Non-pressure chronic ulcer of other part of right foot limited to breakdown of skin: Secondary | ICD-10-CM | POA: Diagnosis not present

## 2018-11-07 DIAGNOSIS — R05 Cough: Secondary | ICD-10-CM | POA: Diagnosis not present

## 2018-11-07 DIAGNOSIS — R0600 Dyspnea, unspecified: Secondary | ICD-10-CM | POA: Diagnosis not present

## 2018-11-07 DIAGNOSIS — J0100 Acute maxillary sinusitis, unspecified: Secondary | ICD-10-CM | POA: Diagnosis not present

## 2018-11-26 DIAGNOSIS — M79671 Pain in right foot: Secondary | ICD-10-CM | POA: Diagnosis not present

## 2018-11-26 DIAGNOSIS — D2371 Other benign neoplasm of skin of right lower limb, including hip: Secondary | ICD-10-CM | POA: Diagnosis not present

## 2018-12-03 DIAGNOSIS — R300 Dysuria: Secondary | ICD-10-CM | POA: Diagnosis not present

## 2018-12-03 DIAGNOSIS — R32 Unspecified urinary incontinence: Secondary | ICD-10-CM | POA: Diagnosis not present

## 2018-12-06 DIAGNOSIS — M5136 Other intervertebral disc degeneration, lumbar region: Secondary | ICD-10-CM | POA: Diagnosis not present

## 2018-12-06 DIAGNOSIS — M9902 Segmental and somatic dysfunction of thoracic region: Secondary | ICD-10-CM | POA: Diagnosis not present

## 2018-12-06 DIAGNOSIS — M62830 Muscle spasm of back: Secondary | ICD-10-CM | POA: Diagnosis not present

## 2018-12-06 DIAGNOSIS — M9903 Segmental and somatic dysfunction of lumbar region: Secondary | ICD-10-CM | POA: Diagnosis not present

## 2018-12-10 DIAGNOSIS — M5136 Other intervertebral disc degeneration, lumbar region: Secondary | ICD-10-CM | POA: Diagnosis not present

## 2018-12-10 DIAGNOSIS — M9902 Segmental and somatic dysfunction of thoracic region: Secondary | ICD-10-CM | POA: Diagnosis not present

## 2018-12-10 DIAGNOSIS — M9903 Segmental and somatic dysfunction of lumbar region: Secondary | ICD-10-CM | POA: Diagnosis not present

## 2018-12-10 DIAGNOSIS — M62830 Muscle spasm of back: Secondary | ICD-10-CM | POA: Diagnosis not present

## 2018-12-13 DIAGNOSIS — M9903 Segmental and somatic dysfunction of lumbar region: Secondary | ICD-10-CM | POA: Diagnosis not present

## 2018-12-13 DIAGNOSIS — M9902 Segmental and somatic dysfunction of thoracic region: Secondary | ICD-10-CM | POA: Diagnosis not present

## 2018-12-13 DIAGNOSIS — M62830 Muscle spasm of back: Secondary | ICD-10-CM | POA: Diagnosis not present

## 2018-12-13 DIAGNOSIS — M5136 Other intervertebral disc degeneration, lumbar region: Secondary | ICD-10-CM | POA: Diagnosis not present

## 2018-12-15 DIAGNOSIS — M9902 Segmental and somatic dysfunction of thoracic region: Secondary | ICD-10-CM | POA: Diagnosis not present

## 2018-12-15 DIAGNOSIS — M5136 Other intervertebral disc degeneration, lumbar region: Secondary | ICD-10-CM | POA: Diagnosis not present

## 2018-12-15 DIAGNOSIS — M62830 Muscle spasm of back: Secondary | ICD-10-CM | POA: Diagnosis not present

## 2018-12-15 DIAGNOSIS — M9903 Segmental and somatic dysfunction of lumbar region: Secondary | ICD-10-CM | POA: Diagnosis not present

## 2018-12-17 DIAGNOSIS — M79671 Pain in right foot: Secondary | ICD-10-CM | POA: Diagnosis not present

## 2018-12-17 DIAGNOSIS — D2371 Other benign neoplasm of skin of right lower limb, including hip: Secondary | ICD-10-CM | POA: Diagnosis not present

## 2018-12-24 DIAGNOSIS — M62830 Muscle spasm of back: Secondary | ICD-10-CM | POA: Diagnosis not present

## 2018-12-24 DIAGNOSIS — M5136 Other intervertebral disc degeneration, lumbar region: Secondary | ICD-10-CM | POA: Diagnosis not present

## 2018-12-24 DIAGNOSIS — M0579 Rheumatoid arthritis with rheumatoid factor of multiple sites without organ or systems involvement: Secondary | ICD-10-CM | POA: Diagnosis not present

## 2018-12-24 DIAGNOSIS — M9903 Segmental and somatic dysfunction of lumbar region: Secondary | ICD-10-CM | POA: Diagnosis not present

## 2018-12-24 DIAGNOSIS — M9902 Segmental and somatic dysfunction of thoracic region: Secondary | ICD-10-CM | POA: Diagnosis not present

## 2018-12-24 DIAGNOSIS — M8949 Other hypertrophic osteoarthropathy, multiple sites: Secondary | ICD-10-CM | POA: Diagnosis not present

## 2018-12-27 DIAGNOSIS — M62830 Muscle spasm of back: Secondary | ICD-10-CM | POA: Diagnosis not present

## 2018-12-27 DIAGNOSIS — M9903 Segmental and somatic dysfunction of lumbar region: Secondary | ICD-10-CM | POA: Diagnosis not present

## 2018-12-27 DIAGNOSIS — M5136 Other intervertebral disc degeneration, lumbar region: Secondary | ICD-10-CM | POA: Diagnosis not present

## 2018-12-27 DIAGNOSIS — M9902 Segmental and somatic dysfunction of thoracic region: Secondary | ICD-10-CM | POA: Diagnosis not present

## 2018-12-31 DIAGNOSIS — M9902 Segmental and somatic dysfunction of thoracic region: Secondary | ICD-10-CM | POA: Diagnosis not present

## 2018-12-31 DIAGNOSIS — M9903 Segmental and somatic dysfunction of lumbar region: Secondary | ICD-10-CM | POA: Diagnosis not present

## 2018-12-31 DIAGNOSIS — M5136 Other intervertebral disc degeneration, lumbar region: Secondary | ICD-10-CM | POA: Diagnosis not present

## 2018-12-31 DIAGNOSIS — M62830 Muscle spasm of back: Secondary | ICD-10-CM | POA: Diagnosis not present

## 2019-01-03 DIAGNOSIS — M9902 Segmental and somatic dysfunction of thoracic region: Secondary | ICD-10-CM | POA: Diagnosis not present

## 2019-01-03 DIAGNOSIS — M62830 Muscle spasm of back: Secondary | ICD-10-CM | POA: Diagnosis not present

## 2019-01-03 DIAGNOSIS — R339 Retention of urine, unspecified: Secondary | ICD-10-CM | POA: Diagnosis not present

## 2019-01-03 DIAGNOSIS — M9903 Segmental and somatic dysfunction of lumbar region: Secondary | ICD-10-CM | POA: Diagnosis not present

## 2019-01-03 DIAGNOSIS — M5136 Other intervertebral disc degeneration, lumbar region: Secondary | ICD-10-CM | POA: Diagnosis not present

## 2019-01-07 DIAGNOSIS — M5136 Other intervertebral disc degeneration, lumbar region: Secondary | ICD-10-CM | POA: Diagnosis not present

## 2019-01-07 DIAGNOSIS — M9902 Segmental and somatic dysfunction of thoracic region: Secondary | ICD-10-CM | POA: Diagnosis not present

## 2019-01-07 DIAGNOSIS — M9903 Segmental and somatic dysfunction of lumbar region: Secondary | ICD-10-CM | POA: Diagnosis not present

## 2019-01-07 DIAGNOSIS — M62830 Muscle spasm of back: Secondary | ICD-10-CM | POA: Diagnosis not present

## 2019-01-10 DIAGNOSIS — M9902 Segmental and somatic dysfunction of thoracic region: Secondary | ICD-10-CM | POA: Diagnosis not present

## 2019-01-10 DIAGNOSIS — M9903 Segmental and somatic dysfunction of lumbar region: Secondary | ICD-10-CM | POA: Diagnosis not present

## 2019-01-10 DIAGNOSIS — M62830 Muscle spasm of back: Secondary | ICD-10-CM | POA: Diagnosis not present

## 2019-01-10 DIAGNOSIS — M5136 Other intervertebral disc degeneration, lumbar region: Secondary | ICD-10-CM | POA: Diagnosis not present

## 2019-01-14 DIAGNOSIS — M9903 Segmental and somatic dysfunction of lumbar region: Secondary | ICD-10-CM | POA: Diagnosis not present

## 2019-01-14 DIAGNOSIS — M9902 Segmental and somatic dysfunction of thoracic region: Secondary | ICD-10-CM | POA: Diagnosis not present

## 2019-01-14 DIAGNOSIS — M62830 Muscle spasm of back: Secondary | ICD-10-CM | POA: Diagnosis not present

## 2019-01-14 DIAGNOSIS — M5136 Other intervertebral disc degeneration, lumbar region: Secondary | ICD-10-CM | POA: Diagnosis not present

## 2019-01-17 DIAGNOSIS — M9903 Segmental and somatic dysfunction of lumbar region: Secondary | ICD-10-CM | POA: Diagnosis not present

## 2019-01-17 DIAGNOSIS — M5136 Other intervertebral disc degeneration, lumbar region: Secondary | ICD-10-CM | POA: Diagnosis not present

## 2019-01-17 DIAGNOSIS — M62830 Muscle spasm of back: Secondary | ICD-10-CM | POA: Diagnosis not present

## 2019-01-17 DIAGNOSIS — M9902 Segmental and somatic dysfunction of thoracic region: Secondary | ICD-10-CM | POA: Diagnosis not present

## 2019-01-21 DIAGNOSIS — M9902 Segmental and somatic dysfunction of thoracic region: Secondary | ICD-10-CM | POA: Diagnosis not present

## 2019-01-21 DIAGNOSIS — M5136 Other intervertebral disc degeneration, lumbar region: Secondary | ICD-10-CM | POA: Diagnosis not present

## 2019-01-21 DIAGNOSIS — M9903 Segmental and somatic dysfunction of lumbar region: Secondary | ICD-10-CM | POA: Diagnosis not present

## 2019-01-21 DIAGNOSIS — M62830 Muscle spasm of back: Secondary | ICD-10-CM | POA: Diagnosis not present

## 2019-01-22 DIAGNOSIS — M79671 Pain in right foot: Secondary | ICD-10-CM | POA: Diagnosis not present

## 2019-01-22 DIAGNOSIS — T372X5A Adverse effect of antimalarials and drugs acting on other blood protozoa, initial encounter: Secondary | ICD-10-CM | POA: Diagnosis not present

## 2019-01-22 DIAGNOSIS — H1045 Other chronic allergic conjunctivitis: Secondary | ICD-10-CM | POA: Diagnosis not present

## 2019-01-22 DIAGNOSIS — H35389 Toxic maculopathy, unspecified eye: Secondary | ICD-10-CM | POA: Diagnosis not present

## 2019-01-22 DIAGNOSIS — H35372 Puckering of macula, left eye: Secondary | ICD-10-CM | POA: Diagnosis not present

## 2019-01-24 DIAGNOSIS — M9902 Segmental and somatic dysfunction of thoracic region: Secondary | ICD-10-CM | POA: Diagnosis not present

## 2019-01-24 DIAGNOSIS — M62830 Muscle spasm of back: Secondary | ICD-10-CM | POA: Diagnosis not present

## 2019-01-24 DIAGNOSIS — M5136 Other intervertebral disc degeneration, lumbar region: Secondary | ICD-10-CM | POA: Diagnosis not present

## 2019-01-24 DIAGNOSIS — M9903 Segmental and somatic dysfunction of lumbar region: Secondary | ICD-10-CM | POA: Diagnosis not present

## 2019-01-28 DIAGNOSIS — M5136 Other intervertebral disc degeneration, lumbar region: Secondary | ICD-10-CM | POA: Diagnosis not present

## 2019-01-28 DIAGNOSIS — M9903 Segmental and somatic dysfunction of lumbar region: Secondary | ICD-10-CM | POA: Diagnosis not present

## 2019-01-28 DIAGNOSIS — M62830 Muscle spasm of back: Secondary | ICD-10-CM | POA: Diagnosis not present

## 2019-01-28 DIAGNOSIS — M9902 Segmental and somatic dysfunction of thoracic region: Secondary | ICD-10-CM | POA: Diagnosis not present

## 2019-02-04 DIAGNOSIS — M5136 Other intervertebral disc degeneration, lumbar region: Secondary | ICD-10-CM | POA: Diagnosis not present

## 2019-02-04 DIAGNOSIS — M9902 Segmental and somatic dysfunction of thoracic region: Secondary | ICD-10-CM | POA: Diagnosis not present

## 2019-02-04 DIAGNOSIS — M62830 Muscle spasm of back: Secondary | ICD-10-CM | POA: Diagnosis not present

## 2019-02-04 DIAGNOSIS — M9903 Segmental and somatic dysfunction of lumbar region: Secondary | ICD-10-CM | POA: Diagnosis not present

## 2019-02-06 DIAGNOSIS — M79671 Pain in right foot: Secondary | ICD-10-CM | POA: Diagnosis not present

## 2019-02-06 DIAGNOSIS — M7751 Other enthesopathy of right foot: Secondary | ICD-10-CM | POA: Diagnosis not present

## 2019-02-06 DIAGNOSIS — M2021 Hallux rigidus, right foot: Secondary | ICD-10-CM | POA: Diagnosis not present

## 2019-02-11 ENCOUNTER — Other Ambulatory Visit: Payer: Self-pay | Admitting: Internal Medicine

## 2019-02-11 DIAGNOSIS — M62830 Muscle spasm of back: Secondary | ICD-10-CM | POA: Diagnosis not present

## 2019-02-11 DIAGNOSIS — M5136 Other intervertebral disc degeneration, lumbar region: Secondary | ICD-10-CM | POA: Diagnosis not present

## 2019-02-11 DIAGNOSIS — Z1231 Encounter for screening mammogram for malignant neoplasm of breast: Secondary | ICD-10-CM

## 2019-02-11 DIAGNOSIS — M9903 Segmental and somatic dysfunction of lumbar region: Secondary | ICD-10-CM | POA: Diagnosis not present

## 2019-02-11 DIAGNOSIS — M9902 Segmental and somatic dysfunction of thoracic region: Secondary | ICD-10-CM | POA: Diagnosis not present

## 2019-02-14 DIAGNOSIS — R8290 Unspecified abnormal findings in urine: Secondary | ICD-10-CM | POA: Diagnosis not present

## 2019-02-14 DIAGNOSIS — Z0000 Encounter for general adult medical examination without abnormal findings: Secondary | ICD-10-CM | POA: Diagnosis not present

## 2019-02-14 DIAGNOSIS — E785 Hyperlipidemia, unspecified: Secondary | ICD-10-CM | POA: Diagnosis not present

## 2019-02-14 DIAGNOSIS — F3340 Major depressive disorder, recurrent, in remission, unspecified: Secondary | ICD-10-CM | POA: Diagnosis not present

## 2019-02-14 DIAGNOSIS — J4520 Mild intermittent asthma, uncomplicated: Secondary | ICD-10-CM | POA: Diagnosis not present

## 2019-02-14 DIAGNOSIS — N3010 Interstitial cystitis (chronic) without hematuria: Secondary | ICD-10-CM | POA: Diagnosis not present

## 2019-02-14 DIAGNOSIS — K219 Gastro-esophageal reflux disease without esophagitis: Secondary | ICD-10-CM | POA: Diagnosis not present

## 2019-02-14 DIAGNOSIS — M62830 Muscle spasm of back: Secondary | ICD-10-CM | POA: Diagnosis not present

## 2019-02-14 DIAGNOSIS — M9903 Segmental and somatic dysfunction of lumbar region: Secondary | ICD-10-CM | POA: Diagnosis not present

## 2019-02-14 DIAGNOSIS — N390 Urinary tract infection, site not specified: Secondary | ICD-10-CM | POA: Diagnosis not present

## 2019-02-14 DIAGNOSIS — M9902 Segmental and somatic dysfunction of thoracic region: Secondary | ICD-10-CM | POA: Diagnosis not present

## 2019-02-14 DIAGNOSIS — Z6841 Body Mass Index (BMI) 40.0 and over, adult: Secondary | ICD-10-CM | POA: Diagnosis not present

## 2019-02-14 DIAGNOSIS — M159 Polyosteoarthritis, unspecified: Secondary | ICD-10-CM | POA: Diagnosis not present

## 2019-02-14 DIAGNOSIS — M5136 Other intervertebral disc degeneration, lumbar region: Secondary | ICD-10-CM | POA: Diagnosis not present

## 2019-02-18 DIAGNOSIS — M5136 Other intervertebral disc degeneration, lumbar region: Secondary | ICD-10-CM | POA: Diagnosis not present

## 2019-02-18 DIAGNOSIS — M9902 Segmental and somatic dysfunction of thoracic region: Secondary | ICD-10-CM | POA: Diagnosis not present

## 2019-02-18 DIAGNOSIS — M9903 Segmental and somatic dysfunction of lumbar region: Secondary | ICD-10-CM | POA: Diagnosis not present

## 2019-02-18 DIAGNOSIS — M62830 Muscle spasm of back: Secondary | ICD-10-CM | POA: Diagnosis not present

## 2019-02-20 DIAGNOSIS — M2021 Hallux rigidus, right foot: Secondary | ICD-10-CM | POA: Diagnosis not present

## 2019-02-20 DIAGNOSIS — M79671 Pain in right foot: Secondary | ICD-10-CM | POA: Diagnosis not present

## 2019-02-20 DIAGNOSIS — M7752 Other enthesopathy of left foot: Secondary | ICD-10-CM | POA: Diagnosis not present

## 2019-02-21 DIAGNOSIS — D649 Anemia, unspecified: Secondary | ICD-10-CM | POA: Diagnosis not present

## 2019-02-21 DIAGNOSIS — M62830 Muscle spasm of back: Secondary | ICD-10-CM | POA: Diagnosis not present

## 2019-02-21 DIAGNOSIS — I358 Other nonrheumatic aortic valve disorders: Secondary | ICD-10-CM | POA: Diagnosis not present

## 2019-02-21 DIAGNOSIS — J4520 Mild intermittent asthma, uncomplicated: Secondary | ICD-10-CM | POA: Diagnosis not present

## 2019-02-21 DIAGNOSIS — F413 Other mixed anxiety disorders: Secondary | ICD-10-CM | POA: Diagnosis not present

## 2019-02-21 DIAGNOSIS — M9902 Segmental and somatic dysfunction of thoracic region: Secondary | ICD-10-CM | POA: Diagnosis not present

## 2019-02-21 DIAGNOSIS — M9903 Segmental and somatic dysfunction of lumbar region: Secondary | ICD-10-CM | POA: Diagnosis not present

## 2019-02-21 DIAGNOSIS — M159 Polyosteoarthritis, unspecified: Secondary | ICD-10-CM | POA: Diagnosis not present

## 2019-02-21 DIAGNOSIS — Z6841 Body Mass Index (BMI) 40.0 and over, adult: Secondary | ICD-10-CM | POA: Diagnosis not present

## 2019-02-21 DIAGNOSIS — F3340 Major depressive disorder, recurrent, in remission, unspecified: Secondary | ICD-10-CM | POA: Diagnosis not present

## 2019-02-21 DIAGNOSIS — M5136 Other intervertebral disc degeneration, lumbar region: Secondary | ICD-10-CM | POA: Diagnosis not present

## 2019-02-21 DIAGNOSIS — R262 Difficulty in walking, not elsewhere classified: Secondary | ICD-10-CM | POA: Diagnosis not present

## 2019-02-25 DIAGNOSIS — M5136 Other intervertebral disc degeneration, lumbar region: Secondary | ICD-10-CM | POA: Diagnosis not present

## 2019-02-25 DIAGNOSIS — M9903 Segmental and somatic dysfunction of lumbar region: Secondary | ICD-10-CM | POA: Diagnosis not present

## 2019-02-25 DIAGNOSIS — M9902 Segmental and somatic dysfunction of thoracic region: Secondary | ICD-10-CM | POA: Diagnosis not present

## 2019-02-25 DIAGNOSIS — M62830 Muscle spasm of back: Secondary | ICD-10-CM | POA: Diagnosis not present

## 2019-03-04 DIAGNOSIS — M9903 Segmental and somatic dysfunction of lumbar region: Secondary | ICD-10-CM | POA: Diagnosis not present

## 2019-03-04 DIAGNOSIS — M5136 Other intervertebral disc degeneration, lumbar region: Secondary | ICD-10-CM | POA: Diagnosis not present

## 2019-03-04 DIAGNOSIS — M9902 Segmental and somatic dysfunction of thoracic region: Secondary | ICD-10-CM | POA: Diagnosis not present

## 2019-03-04 DIAGNOSIS — M62830 Muscle spasm of back: Secondary | ICD-10-CM | POA: Diagnosis not present

## 2019-03-11 DIAGNOSIS — M9903 Segmental and somatic dysfunction of lumbar region: Secondary | ICD-10-CM | POA: Diagnosis not present

## 2019-03-11 DIAGNOSIS — M62830 Muscle spasm of back: Secondary | ICD-10-CM | POA: Diagnosis not present

## 2019-03-11 DIAGNOSIS — M9902 Segmental and somatic dysfunction of thoracic region: Secondary | ICD-10-CM | POA: Diagnosis not present

## 2019-03-11 DIAGNOSIS — M5136 Other intervertebral disc degeneration, lumbar region: Secondary | ICD-10-CM | POA: Diagnosis not present

## 2019-03-14 DIAGNOSIS — M9902 Segmental and somatic dysfunction of thoracic region: Secondary | ICD-10-CM | POA: Diagnosis not present

## 2019-03-14 DIAGNOSIS — M9903 Segmental and somatic dysfunction of lumbar region: Secondary | ICD-10-CM | POA: Diagnosis not present

## 2019-03-14 DIAGNOSIS — M5136 Other intervertebral disc degeneration, lumbar region: Secondary | ICD-10-CM | POA: Diagnosis not present

## 2019-03-14 DIAGNOSIS — M62830 Muscle spasm of back: Secondary | ICD-10-CM | POA: Diagnosis not present

## 2019-03-18 DIAGNOSIS — M5136 Other intervertebral disc degeneration, lumbar region: Secondary | ICD-10-CM | POA: Diagnosis not present

## 2019-03-18 DIAGNOSIS — M9903 Segmental and somatic dysfunction of lumbar region: Secondary | ICD-10-CM | POA: Diagnosis not present

## 2019-03-18 DIAGNOSIS — M62830 Muscle spasm of back: Secondary | ICD-10-CM | POA: Diagnosis not present

## 2019-03-18 DIAGNOSIS — M9902 Segmental and somatic dysfunction of thoracic region: Secondary | ICD-10-CM | POA: Diagnosis not present

## 2019-03-21 DIAGNOSIS — J3489 Other specified disorders of nose and nasal sinuses: Secondary | ICD-10-CM | POA: Diagnosis not present

## 2019-03-21 DIAGNOSIS — M9903 Segmental and somatic dysfunction of lumbar region: Secondary | ICD-10-CM | POA: Diagnosis not present

## 2019-03-21 DIAGNOSIS — M9902 Segmental and somatic dysfunction of thoracic region: Secondary | ICD-10-CM | POA: Diagnosis not present

## 2019-03-21 DIAGNOSIS — J432 Centrilobular emphysema: Secondary | ICD-10-CM | POA: Diagnosis not present

## 2019-03-21 DIAGNOSIS — M62830 Muscle spasm of back: Secondary | ICD-10-CM | POA: Diagnosis not present

## 2019-03-21 DIAGNOSIS — R05 Cough: Secondary | ICD-10-CM | POA: Diagnosis not present

## 2019-03-21 DIAGNOSIS — M5136 Other intervertebral disc degeneration, lumbar region: Secondary | ICD-10-CM | POA: Diagnosis not present

## 2019-03-22 DIAGNOSIS — R350 Frequency of micturition: Secondary | ICD-10-CM | POA: Diagnosis not present

## 2019-03-22 DIAGNOSIS — R300 Dysuria: Secondary | ICD-10-CM | POA: Diagnosis not present

## 2019-03-26 ENCOUNTER — Ambulatory Visit
Admission: RE | Admit: 2019-03-26 | Discharge: 2019-03-26 | Disposition: A | Discharge status: Home / Self Care | Payer: PPO | Source: Ambulatory Visit | Attending: Internal Medicine | Admitting: Internal Medicine

## 2019-03-26 ENCOUNTER — Encounter (INDEPENDENT_AMBULATORY_CARE_PROVIDER_SITE_OTHER): Payer: Self-pay

## 2019-03-26 ENCOUNTER — Other Ambulatory Visit: Payer: Self-pay

## 2019-03-26 DIAGNOSIS — Z1231 Encounter for screening mammogram for malignant neoplasm of breast: Secondary | ICD-10-CM

## 2019-04-02 DIAGNOSIS — R531 Weakness: Secondary | ICD-10-CM | POA: Diagnosis not present

## 2019-04-02 DIAGNOSIS — R262 Difficulty in walking, not elsewhere classified: Secondary | ICD-10-CM | POA: Diagnosis not present

## 2019-04-02 DIAGNOSIS — R54 Age-related physical debility: Secondary | ICD-10-CM | POA: Diagnosis not present

## 2019-04-02 DIAGNOSIS — M545 Low back pain: Secondary | ICD-10-CM | POA: Diagnosis not present

## 2019-04-04 DIAGNOSIS — R531 Weakness: Secondary | ICD-10-CM | POA: Diagnosis not present

## 2019-04-04 DIAGNOSIS — M545 Low back pain: Secondary | ICD-10-CM | POA: Diagnosis not present

## 2019-04-04 DIAGNOSIS — R54 Age-related physical debility: Secondary | ICD-10-CM | POA: Diagnosis not present

## 2019-04-04 DIAGNOSIS — R262 Difficulty in walking, not elsewhere classified: Secondary | ICD-10-CM | POA: Diagnosis not present

## 2019-04-09 DIAGNOSIS — R531 Weakness: Secondary | ICD-10-CM | POA: Diagnosis not present

## 2019-04-09 DIAGNOSIS — R262 Difficulty in walking, not elsewhere classified: Secondary | ICD-10-CM | POA: Diagnosis not present

## 2019-04-09 DIAGNOSIS — R54 Age-related physical debility: Secondary | ICD-10-CM | POA: Diagnosis not present

## 2019-04-09 DIAGNOSIS — M545 Low back pain: Secondary | ICD-10-CM | POA: Diagnosis not present

## 2019-04-11 DIAGNOSIS — R54 Age-related physical debility: Secondary | ICD-10-CM | POA: Diagnosis not present

## 2019-04-11 DIAGNOSIS — R262 Difficulty in walking, not elsewhere classified: Secondary | ICD-10-CM | POA: Diagnosis not present

## 2019-04-11 DIAGNOSIS — M545 Low back pain: Secondary | ICD-10-CM | POA: Diagnosis not present

## 2019-04-11 DIAGNOSIS — R531 Weakness: Secondary | ICD-10-CM | POA: Diagnosis not present

## 2019-04-16 DIAGNOSIS — M545 Low back pain: Secondary | ICD-10-CM | POA: Diagnosis not present

## 2019-04-16 DIAGNOSIS — R531 Weakness: Secondary | ICD-10-CM | POA: Diagnosis not present

## 2019-04-16 DIAGNOSIS — R262 Difficulty in walking, not elsewhere classified: Secondary | ICD-10-CM | POA: Diagnosis not present

## 2019-04-16 DIAGNOSIS — R54 Age-related physical debility: Secondary | ICD-10-CM | POA: Diagnosis not present

## 2019-04-18 DIAGNOSIS — R531 Weakness: Secondary | ICD-10-CM | POA: Diagnosis not present

## 2019-04-18 DIAGNOSIS — R262 Difficulty in walking, not elsewhere classified: Secondary | ICD-10-CM | POA: Diagnosis not present

## 2019-04-18 DIAGNOSIS — R54 Age-related physical debility: Secondary | ICD-10-CM | POA: Diagnosis not present

## 2019-04-18 DIAGNOSIS — M545 Low back pain: Secondary | ICD-10-CM | POA: Diagnosis not present

## 2019-04-22 DIAGNOSIS — M545 Low back pain: Secondary | ICD-10-CM | POA: Diagnosis not present

## 2019-04-22 DIAGNOSIS — R531 Weakness: Secondary | ICD-10-CM | POA: Diagnosis not present

## 2019-04-22 DIAGNOSIS — R54 Age-related physical debility: Secondary | ICD-10-CM | POA: Diagnosis not present

## 2019-04-22 DIAGNOSIS — R262 Difficulty in walking, not elsewhere classified: Secondary | ICD-10-CM | POA: Diagnosis not present

## 2019-04-30 DIAGNOSIS — M545 Low back pain: Secondary | ICD-10-CM | POA: Diagnosis not present

## 2019-04-30 DIAGNOSIS — R54 Age-related physical debility: Secondary | ICD-10-CM | POA: Diagnosis not present

## 2019-04-30 DIAGNOSIS — R262 Difficulty in walking, not elsewhere classified: Secondary | ICD-10-CM | POA: Diagnosis not present

## 2019-04-30 DIAGNOSIS — R300 Dysuria: Secondary | ICD-10-CM | POA: Diagnosis not present

## 2019-04-30 DIAGNOSIS — R531 Weakness: Secondary | ICD-10-CM | POA: Diagnosis not present

## 2019-05-02 DIAGNOSIS — R262 Difficulty in walking, not elsewhere classified: Secondary | ICD-10-CM | POA: Diagnosis not present

## 2019-05-02 DIAGNOSIS — M545 Low back pain: Secondary | ICD-10-CM | POA: Diagnosis not present

## 2019-05-02 DIAGNOSIS — R531 Weakness: Secondary | ICD-10-CM | POA: Diagnosis not present

## 2019-05-02 DIAGNOSIS — R54 Age-related physical debility: Secondary | ICD-10-CM | POA: Diagnosis not present

## 2019-05-07 DIAGNOSIS — M545 Low back pain: Secondary | ICD-10-CM | POA: Diagnosis not present

## 2019-05-07 DIAGNOSIS — R54 Age-related physical debility: Secondary | ICD-10-CM | POA: Diagnosis not present

## 2019-05-07 DIAGNOSIS — R262 Difficulty in walking, not elsewhere classified: Secondary | ICD-10-CM | POA: Diagnosis not present

## 2019-05-07 DIAGNOSIS — R531 Weakness: Secondary | ICD-10-CM | POA: Diagnosis not present

## 2019-05-09 DIAGNOSIS — R262 Difficulty in walking, not elsewhere classified: Secondary | ICD-10-CM | POA: Diagnosis not present

## 2019-05-09 DIAGNOSIS — R531 Weakness: Secondary | ICD-10-CM | POA: Diagnosis not present

## 2019-05-09 DIAGNOSIS — R54 Age-related physical debility: Secondary | ICD-10-CM | POA: Diagnosis not present

## 2019-05-09 DIAGNOSIS — M545 Low back pain: Secondary | ICD-10-CM | POA: Diagnosis not present

## 2019-05-14 DIAGNOSIS — R54 Age-related physical debility: Secondary | ICD-10-CM | POA: Diagnosis not present

## 2019-05-14 DIAGNOSIS — R262 Difficulty in walking, not elsewhere classified: Secondary | ICD-10-CM | POA: Diagnosis not present

## 2019-05-14 DIAGNOSIS — R531 Weakness: Secondary | ICD-10-CM | POA: Diagnosis not present

## 2019-05-14 DIAGNOSIS — M545 Low back pain: Secondary | ICD-10-CM | POA: Diagnosis not present

## 2019-05-16 DIAGNOSIS — R54 Age-related physical debility: Secondary | ICD-10-CM | POA: Diagnosis not present

## 2019-05-16 DIAGNOSIS — R531 Weakness: Secondary | ICD-10-CM | POA: Diagnosis not present

## 2019-05-16 DIAGNOSIS — M545 Low back pain: Secondary | ICD-10-CM | POA: Diagnosis not present

## 2019-05-16 DIAGNOSIS — R262 Difficulty in walking, not elsewhere classified: Secondary | ICD-10-CM | POA: Diagnosis not present

## 2019-05-21 DIAGNOSIS — R54 Age-related physical debility: Secondary | ICD-10-CM | POA: Diagnosis not present

## 2019-05-21 DIAGNOSIS — M545 Low back pain: Secondary | ICD-10-CM | POA: Diagnosis not present

## 2019-05-21 DIAGNOSIS — R531 Weakness: Secondary | ICD-10-CM | POA: Diagnosis not present

## 2019-05-21 DIAGNOSIS — R262 Difficulty in walking, not elsewhere classified: Secondary | ICD-10-CM | POA: Diagnosis not present

## 2019-05-23 DIAGNOSIS — M545 Low back pain: Secondary | ICD-10-CM | POA: Diagnosis not present

## 2019-05-23 DIAGNOSIS — R531 Weakness: Secondary | ICD-10-CM | POA: Diagnosis not present

## 2019-05-23 DIAGNOSIS — R54 Age-related physical debility: Secondary | ICD-10-CM | POA: Diagnosis not present

## 2019-05-23 DIAGNOSIS — R262 Difficulty in walking, not elsewhere classified: Secondary | ICD-10-CM | POA: Diagnosis not present

## 2019-05-28 DIAGNOSIS — R531 Weakness: Secondary | ICD-10-CM | POA: Diagnosis not present

## 2019-05-28 DIAGNOSIS — M545 Low back pain: Secondary | ICD-10-CM | POA: Diagnosis not present

## 2019-05-28 DIAGNOSIS — R54 Age-related physical debility: Secondary | ICD-10-CM | POA: Diagnosis not present

## 2019-05-28 DIAGNOSIS — R262 Difficulty in walking, not elsewhere classified: Secondary | ICD-10-CM | POA: Diagnosis not present

## 2019-05-30 DIAGNOSIS — M545 Low back pain: Secondary | ICD-10-CM | POA: Diagnosis not present

## 2019-05-30 DIAGNOSIS — R262 Difficulty in walking, not elsewhere classified: Secondary | ICD-10-CM | POA: Diagnosis not present

## 2019-05-30 DIAGNOSIS — R54 Age-related physical debility: Secondary | ICD-10-CM | POA: Diagnosis not present

## 2019-05-30 DIAGNOSIS — R531 Weakness: Secondary | ICD-10-CM | POA: Diagnosis not present

## 2019-06-04 DIAGNOSIS — R262 Difficulty in walking, not elsewhere classified: Secondary | ICD-10-CM | POA: Diagnosis not present

## 2019-06-04 DIAGNOSIS — M545 Low back pain: Secondary | ICD-10-CM | POA: Diagnosis not present

## 2019-06-04 DIAGNOSIS — R54 Age-related physical debility: Secondary | ICD-10-CM | POA: Diagnosis not present

## 2019-06-04 DIAGNOSIS — R531 Weakness: Secondary | ICD-10-CM | POA: Diagnosis not present

## 2019-06-06 DIAGNOSIS — M545 Low back pain: Secondary | ICD-10-CM | POA: Diagnosis not present

## 2019-06-06 DIAGNOSIS — R262 Difficulty in walking, not elsewhere classified: Secondary | ICD-10-CM | POA: Diagnosis not present

## 2019-06-06 DIAGNOSIS — K56609 Unspecified intestinal obstruction, unspecified as to partial versus complete obstruction: Secondary | ICD-10-CM

## 2019-06-06 DIAGNOSIS — R54 Age-related physical debility: Secondary | ICD-10-CM | POA: Diagnosis not present

## 2019-06-06 DIAGNOSIS — R531 Weakness: Secondary | ICD-10-CM | POA: Diagnosis not present

## 2019-06-06 HISTORY — DX: Unspecified intestinal obstruction, unspecified as to partial versus complete obstruction: K56.609

## 2019-06-13 DIAGNOSIS — R54 Age-related physical debility: Secondary | ICD-10-CM | POA: Diagnosis not present

## 2019-06-13 DIAGNOSIS — R531 Weakness: Secondary | ICD-10-CM | POA: Diagnosis not present

## 2019-06-13 DIAGNOSIS — M545 Low back pain: Secondary | ICD-10-CM | POA: Diagnosis not present

## 2019-06-13 DIAGNOSIS — R262 Difficulty in walking, not elsewhere classified: Secondary | ICD-10-CM | POA: Diagnosis not present

## 2019-06-18 DIAGNOSIS — M159 Polyosteoarthritis, unspecified: Secondary | ICD-10-CM | POA: Diagnosis not present

## 2019-06-18 DIAGNOSIS — R262 Difficulty in walking, not elsewhere classified: Secondary | ICD-10-CM | POA: Diagnosis not present

## 2019-06-18 DIAGNOSIS — J4520 Mild intermittent asthma, uncomplicated: Secondary | ICD-10-CM | POA: Diagnosis not present

## 2019-06-18 DIAGNOSIS — M8949 Other hypertrophic osteoarthropathy, multiple sites: Secondary | ICD-10-CM | POA: Diagnosis not present

## 2019-06-18 DIAGNOSIS — M0579 Rheumatoid arthritis with rheumatoid factor of multiple sites without organ or systems involvement: Secondary | ICD-10-CM | POA: Diagnosis not present

## 2019-06-18 DIAGNOSIS — F413 Other mixed anxiety disorders: Secondary | ICD-10-CM | POA: Diagnosis not present

## 2019-06-18 DIAGNOSIS — F3340 Major depressive disorder, recurrent, in remission, unspecified: Secondary | ICD-10-CM | POA: Diagnosis not present

## 2019-06-18 DIAGNOSIS — Z6841 Body Mass Index (BMI) 40.0 and over, adult: Secondary | ICD-10-CM | POA: Diagnosis not present

## 2019-06-18 DIAGNOSIS — E785 Hyperlipidemia, unspecified: Secondary | ICD-10-CM | POA: Diagnosis not present

## 2019-06-18 DIAGNOSIS — D649 Anemia, unspecified: Secondary | ICD-10-CM | POA: Diagnosis not present

## 2019-06-25 DIAGNOSIS — K5900 Constipation, unspecified: Secondary | ICD-10-CM | POA: Diagnosis not present

## 2019-06-25 DIAGNOSIS — R140 Abdominal distension (gaseous): Secondary | ICD-10-CM | POA: Diagnosis not present

## 2019-06-25 DIAGNOSIS — Z0000 Encounter for general adult medical examination without abnormal findings: Secondary | ICD-10-CM | POA: Diagnosis not present

## 2019-06-25 DIAGNOSIS — R110 Nausea: Secondary | ICD-10-CM | POA: Diagnosis not present

## 2019-06-25 DIAGNOSIS — M159 Polyosteoarthritis, unspecified: Secondary | ICD-10-CM | POA: Diagnosis not present

## 2019-06-25 DIAGNOSIS — M0579 Rheumatoid arthritis with rheumatoid factor of multiple sites without organ or systems involvement: Secondary | ICD-10-CM | POA: Diagnosis not present

## 2019-06-25 DIAGNOSIS — M545 Low back pain: Secondary | ICD-10-CM | POA: Diagnosis not present

## 2019-06-25 DIAGNOSIS — J4520 Mild intermittent asthma, uncomplicated: Secondary | ICD-10-CM | POA: Diagnosis not present

## 2019-06-25 DIAGNOSIS — F3340 Major depressive disorder, recurrent, in remission, unspecified: Secondary | ICD-10-CM | POA: Diagnosis not present

## 2019-06-25 DIAGNOSIS — Z6841 Body Mass Index (BMI) 40.0 and over, adult: Secondary | ICD-10-CM | POA: Diagnosis not present

## 2019-06-25 DIAGNOSIS — G8929 Other chronic pain: Secondary | ICD-10-CM | POA: Diagnosis not present

## 2019-06-25 DIAGNOSIS — M8949 Other hypertrophic osteoarthropathy, multiple sites: Secondary | ICD-10-CM | POA: Diagnosis not present

## 2019-06-26 ENCOUNTER — Ambulatory Visit
Admission: RE | Admit: 2019-06-26 | Discharge: 2019-06-26 | Disposition: A | Discharge status: Home / Self Care | Payer: PPO | Source: Ambulatory Visit | Attending: Internal Medicine | Admitting: Internal Medicine

## 2019-06-26 ENCOUNTER — Other Ambulatory Visit: Payer: Self-pay

## 2019-06-26 ENCOUNTER — Other Ambulatory Visit: Payer: Self-pay | Admitting: Internal Medicine

## 2019-06-26 DIAGNOSIS — R140 Abdominal distension (gaseous): Secondary | ICD-10-CM | POA: Insufficient documentation

## 2019-06-26 DIAGNOSIS — I700 Atherosclerosis of aorta: Secondary | ICD-10-CM | POA: Diagnosis not present

## 2019-06-26 DIAGNOSIS — M545 Low back pain: Secondary | ICD-10-CM | POA: Diagnosis not present

## 2019-06-26 MED ORDER — IOHEXOL 350 MG/ML SOLN
100.0000 mL | Freq: Once | INTRAVENOUS | Status: AC | PRN
  Administered 2019-06-26: 100 mL via INTRAVENOUS

## 2019-06-27 ENCOUNTER — Other Ambulatory Visit: Payer: Self-pay

## 2019-06-27 ENCOUNTER — Inpatient Hospital Stay
Admission: EM | Admit: 2019-06-27 | Discharge: 2019-06-29 | DRG: 389 | Disposition: A | Discharge status: Home / Self Care | Payer: PPO | Attending: Internal Medicine | Admitting: Internal Medicine

## 2019-06-27 ENCOUNTER — Inpatient Hospital Stay: Payer: PPO

## 2019-06-27 DIAGNOSIS — K5900 Constipation, unspecified: Secondary | ICD-10-CM | POA: Diagnosis not present

## 2019-06-27 DIAGNOSIS — M159 Polyosteoarthritis, unspecified: Secondary | ICD-10-CM | POA: Diagnosis not present

## 2019-06-27 DIAGNOSIS — S22080A Wedge compression fracture of T11-T12 vertebra, initial encounter for closed fracture: Secondary | ICD-10-CM | POA: Diagnosis not present

## 2019-06-27 DIAGNOSIS — Z880 Allergy status to penicillin: Secondary | ICD-10-CM

## 2019-06-27 DIAGNOSIS — J45909 Unspecified asthma, uncomplicated: Secondary | ICD-10-CM | POA: Diagnosis present

## 2019-06-27 DIAGNOSIS — K567 Ileus, unspecified: Principal | ICD-10-CM | POA: Diagnosis present

## 2019-06-27 DIAGNOSIS — Z20822 Contact with and (suspected) exposure to covid-19: Secondary | ICD-10-CM | POA: Diagnosis not present

## 2019-06-27 DIAGNOSIS — Z885 Allergy status to narcotic agent status: Secondary | ICD-10-CM | POA: Diagnosis not present

## 2019-06-27 DIAGNOSIS — D509 Iron deficiency anemia, unspecified: Secondary | ICD-10-CM | POA: Diagnosis present

## 2019-06-27 DIAGNOSIS — Z881 Allergy status to other antibiotic agents status: Secondary | ICD-10-CM | POA: Diagnosis not present

## 2019-06-27 DIAGNOSIS — Z7982 Long term (current) use of aspirin: Secondary | ICD-10-CM | POA: Diagnosis not present

## 2019-06-27 DIAGNOSIS — N3010 Interstitial cystitis (chronic) without hematuria: Secondary | ICD-10-CM | POA: Diagnosis present

## 2019-06-27 DIAGNOSIS — Z7951 Long term (current) use of inhaled steroids: Secondary | ICD-10-CM

## 2019-06-27 DIAGNOSIS — E785 Hyperlipidemia, unspecified: Secondary | ICD-10-CM | POA: Diagnosis present

## 2019-06-27 DIAGNOSIS — Z823 Family history of stroke: Secondary | ICD-10-CM | POA: Diagnosis not present

## 2019-06-27 DIAGNOSIS — Z79899 Other long term (current) drug therapy: Secondary | ICD-10-CM

## 2019-06-27 DIAGNOSIS — R531 Weakness: Secondary | ICD-10-CM | POA: Diagnosis not present

## 2019-06-27 DIAGNOSIS — Z4682 Encounter for fitting and adjustment of non-vascular catheter: Secondary | ICD-10-CM | POA: Diagnosis not present

## 2019-06-27 DIAGNOSIS — M069 Rheumatoid arthritis, unspecified: Secondary | ICD-10-CM | POA: Diagnosis not present

## 2019-06-27 DIAGNOSIS — J4520 Mild intermittent asthma, uncomplicated: Secondary | ICD-10-CM | POA: Diagnosis not present

## 2019-06-27 DIAGNOSIS — E669 Obesity, unspecified: Secondary | ICD-10-CM | POA: Diagnosis not present

## 2019-06-27 DIAGNOSIS — Z6841 Body Mass Index (BMI) 40.0 and over, adult: Secondary | ICD-10-CM | POA: Diagnosis not present

## 2019-06-27 DIAGNOSIS — M4854XA Collapsed vertebra, not elsewhere classified, thoracic region, initial encounter for fracture: Secondary | ICD-10-CM | POA: Diagnosis not present

## 2019-06-27 DIAGNOSIS — K219 Gastro-esophageal reflux disease without esophagitis: Secondary | ICD-10-CM | POA: Diagnosis present

## 2019-06-27 DIAGNOSIS — Z03818 Encounter for observation for suspected exposure to other biological agents ruled out: Secondary | ICD-10-CM | POA: Diagnosis not present

## 2019-06-27 DIAGNOSIS — Z8249 Family history of ischemic heart disease and other diseases of the circulatory system: Secondary | ICD-10-CM | POA: Diagnosis not present

## 2019-06-27 DIAGNOSIS — Z882 Allergy status to sulfonamides status: Secondary | ICD-10-CM | POA: Diagnosis not present

## 2019-06-27 DIAGNOSIS — Z792 Long term (current) use of antibiotics: Secondary | ICD-10-CM | POA: Diagnosis not present

## 2019-06-27 DIAGNOSIS — K529 Noninfective gastroenteritis and colitis, unspecified: Secondary | ICD-10-CM | POA: Diagnosis not present

## 2019-06-27 DIAGNOSIS — R140 Abdominal distension (gaseous): Secondary | ICD-10-CM | POA: Diagnosis not present

## 2019-06-27 DIAGNOSIS — F3340 Major depressive disorder, recurrent, in remission, unspecified: Secondary | ICD-10-CM | POA: Diagnosis not present

## 2019-06-27 LAB — URINALYSIS, COMPLETE (UACMP) WITH MICROSCOPIC
Bacteria, UA: NONE SEEN
Bilirubin Urine: NEGATIVE
Glucose, UA: NEGATIVE mg/dL
Hgb urine dipstick: NEGATIVE
Ketones, ur: NEGATIVE mg/dL
Nitrite: NEGATIVE
Protein, ur: 30 mg/dL — AB
Specific Gravity, Urine: 1.032 — ABNORMAL HIGH (ref 1.005–1.030)
pH: 6 (ref 5.0–8.0)

## 2019-06-27 LAB — COMPREHENSIVE METABOLIC PANEL
ALT: 21 U/L (ref 0–44)
AST: 26 U/L (ref 15–41)
Albumin: 3.7 g/dL (ref 3.5–5.0)
Alkaline Phosphatase: 81 U/L (ref 38–126)
Anion gap: 12 (ref 5–15)
BUN: 30 mg/dL — ABNORMAL HIGH (ref 8–23)
CO2: 34 mmol/L — ABNORMAL HIGH (ref 22–32)
Calcium: 9.4 mg/dL (ref 8.9–10.3)
Chloride: 85 mmol/L — ABNORMAL LOW (ref 98–111)
Creatinine, Ser: 1.02 mg/dL — ABNORMAL HIGH (ref 0.44–1.00)
GFR calc Af Amer: 59 mL/min — ABNORMAL LOW (ref >60)
GFR calc non Af Amer: 51 mL/min — ABNORMAL LOW (ref >60)
Glucose, Bld: 137 mg/dL — ABNORMAL HIGH (ref 70–99)
Potassium: 4.2 mmol/L (ref 3.5–5.1)
Sodium: 131 mmol/L — ABNORMAL LOW (ref 135–145)
Total Bilirubin: 0.9 mg/dL (ref 0.3–1.2)
Total Protein: 7 g/dL (ref 6.5–8.1)

## 2019-06-27 LAB — LIPASE, BLOOD: Lipase: 14 U/L (ref 11–51)

## 2019-06-27 LAB — CBC
HCT: 40.8 % (ref 36.0–46.0)
Hemoglobin: 13.5 g/dL (ref 12.0–15.0)
MCH: 26.7 pg (ref 26.0–34.0)
MCHC: 33.1 g/dL (ref 30.0–36.0)
MCV: 80.6 fL (ref 80.0–100.0)
Platelets: 335 10*3/uL (ref 150–400)
RBC: 5.06 MIL/uL (ref 3.87–5.11)
RDW: 13.9 % (ref 11.5–15.5)
WBC: 15.2 10*3/uL — ABNORMAL HIGH (ref 4.0–10.5)
nRBC: 0 % (ref 0.0–0.2)

## 2019-06-27 LAB — RESPIRATORY PANEL BY RT PCR (FLU A&B, COVID)
Influenza A by PCR: NEGATIVE
Influenza B by PCR: NEGATIVE
SARS Coronavirus 2 by RT PCR: NEGATIVE

## 2019-06-27 MED ORDER — SODIUM CHLORIDE 0.9 % IV SOLN
2.0000 g | INTRAVENOUS | Status: DC
  Administered 2019-06-28: 15:00:00 2 g via INTRAVENOUS
  Filled 2019-06-27: qty 2

## 2019-06-27 MED ORDER — HYDROXYCHLOROQUINE SULFATE 200 MG PO TABS
200.0000 mg | ORAL_TABLET | Freq: Every day | ORAL | Status: DC
  Administered 2019-06-28 – 2019-06-29 (×2): 200 mg via ORAL
  Filled 2019-06-27 (×2): qty 1

## 2019-06-27 MED ORDER — ASPIRIN EC 81 MG PO TBEC
81.0000 mg | DELAYED_RELEASE_TABLET | Freq: Every day | ORAL | Status: DC
  Administered 2019-06-28 – 2019-06-29 (×2): 81 mg via ORAL
  Filled 2019-06-27 (×2): qty 1

## 2019-06-27 MED ORDER — PHENOL 1.4 % MT LIQD
1.0000 | OROMUCOSAL | Status: DC | PRN
  Administered 2019-06-27: 1 via OROMUCOSAL
  Filled 2019-06-27: qty 177

## 2019-06-27 MED ORDER — ONDANSETRON HCL 4 MG/2ML IJ SOLN
4.0000 mg | Freq: Once | INTRAMUSCULAR | Status: AC
  Administered 2019-06-27: 16:00:00 4 mg via INTRAVENOUS
  Filled 2019-06-27: qty 2

## 2019-06-27 MED ORDER — LACTATED RINGERS IV SOLN: INTRAVENOUS | Status: DC

## 2019-06-27 MED ORDER — PANTOPRAZOLE SODIUM 40 MG IV SOLR
40.0000 mg | Freq: Once | INTRAVENOUS | Status: AC
  Administered 2019-06-27: 16:00:00 40 mg via INTRAVENOUS
  Filled 2019-06-27: qty 40

## 2019-06-27 MED ORDER — HYDROXYCHLOROQUINE SULFATE 200 MG PO TABS: 200.0000 mg | ORAL_TABLET | Freq: Every day | ORAL | Status: DC

## 2019-06-27 MED ORDER — ACETAMINOPHEN 650 MG RE SUPP: 650.0000 mg | Freq: Four times a day (QID) | RECTAL | Status: DC | PRN

## 2019-06-27 MED ORDER — PENTOSAN POLYSULFATE SODIUM 100 MG PO CAPS: 100.0000 mg | ORAL_CAPSULE | Freq: Three times a day (TID) | ORAL | Status: DC

## 2019-06-27 MED ORDER — TRAZODONE HCL 50 MG PO TABS
25.0000 mg | ORAL_TABLET | Freq: Every evening | ORAL | Status: DC | PRN
  Administered 2019-06-27 – 2019-06-28 (×2): 25 mg via ORAL
  Filled 2019-06-27 (×2): qty 1

## 2019-06-27 MED ORDER — PENTOSAN POLYSULFATE SODIUM 100 MG PO CAPS
100.0000 mg | ORAL_CAPSULE | Freq: Three times a day (TID) | ORAL | Status: DC
  Administered 2019-06-29: 10:00:00 100 mg via ORAL
  Filled 2019-06-27 (×10): qty 1

## 2019-06-27 MED ORDER — POLYETHYLENE GLYCOL 3350 17 G PO PACK
17.0000 g | PACK | Freq: Every day | ORAL | Status: DC
  Administered 2019-06-28: 10:00:00 17 g via ORAL
  Filled 2019-06-27: qty 1

## 2019-06-27 MED ORDER — SODIUM CHLORIDE 0.9 % IV BOLUS
500.0000 mL | Freq: Once | INTRAVENOUS | Status: AC
  Administered 2019-06-27: 16:00:00 500 mL via INTRAVENOUS

## 2019-06-27 MED ORDER — HYDRALAZINE HCL 20 MG/ML IJ SOLN: 10.0000 mg | INTRAMUSCULAR | Status: DC | PRN

## 2019-06-27 MED ORDER — METRONIDAZOLE IN NACL 5-0.79 MG/ML-% IV SOLN: 500.0000 mg | Freq: Once | INTRAVENOUS | Status: DC

## 2019-06-27 MED ORDER — BISACODYL 10 MG RE SUPP
10.0000 mg | Freq: Every day | RECTAL | Status: DC | PRN
  Filled 2019-06-27: qty 1

## 2019-06-27 MED ORDER — METRONIDAZOLE IN NACL 5-0.79 MG/ML-% IV SOLN
500.0000 mg | Freq: Three times a day (TID) | INTRAVENOUS | Status: DC
  Administered 2019-06-28 – 2019-06-29 (×4): 500 mg via INTRAVENOUS
  Filled 2019-06-27 (×6): qty 100

## 2019-06-27 MED ORDER — PANTOPRAZOLE SODIUM 40 MG IV SOLR
40.0000 mg | INTRAVENOUS | Status: DC
  Administered 2019-06-27 – 2019-06-28 (×2): 40 mg via INTRAVENOUS
  Filled 2019-06-27 (×2): qty 40

## 2019-06-27 MED ORDER — MOMETASONE FURO-FORMOTEROL FUM 200-5 MCG/ACT IN AERO
2.0000 | INHALATION_SPRAY | Freq: Two times a day (BID) | RESPIRATORY_TRACT | Status: DC
  Administered 2019-06-27 – 2019-06-29 (×4): 2 via RESPIRATORY_TRACT
  Filled 2019-06-27 (×2): qty 8.8

## 2019-06-27 MED ORDER — SENNA 8.6 MG PO TABS
1.0000 | ORAL_TABLET | Freq: Two times a day (BID) | ORAL | Status: DC
  Administered 2019-06-27 – 2019-06-28 (×2): 8.6 mg via ORAL
  Filled 2019-06-27 (×2): qty 1

## 2019-06-27 MED ORDER — ONDANSETRON HCL 4 MG PO TABS: 4.0000 mg | ORAL_TABLET | Freq: Four times a day (QID) | ORAL | Status: DC | PRN

## 2019-06-27 MED ORDER — DOCUSATE SODIUM 100 MG PO CAPS
100.0000 mg | ORAL_CAPSULE | Freq: Two times a day (BID) | ORAL | Status: DC
  Administered 2019-06-27 – 2019-06-28 (×2): 100 mg via ORAL
  Filled 2019-06-27 (×2): qty 1

## 2019-06-27 MED ORDER — MONTELUKAST SODIUM 10 MG PO TABS
10.0000 mg | ORAL_TABLET | Freq: Every day | ORAL | Status: DC
  Administered 2019-06-27 – 2019-06-28 (×2): 10 mg via ORAL
  Filled 2019-06-27 (×2): qty 1

## 2019-06-27 MED ORDER — ACETAMINOPHEN 325 MG PO TABS
650.0000 mg | ORAL_TABLET | Freq: Four times a day (QID) | ORAL | Status: DC | PRN
  Administered 2019-06-29: 02:00:00 650 mg via ORAL
  Filled 2019-06-27: qty 2

## 2019-06-27 MED ORDER — ENOXAPARIN SODIUM 40 MG/0.4ML ~~LOC~~ SOLN
40.0000 mg | SUBCUTANEOUS | Status: DC
  Administered 2019-06-27: 40 mg via SUBCUTANEOUS
  Filled 2019-06-27: qty 0.4

## 2019-06-27 MED ORDER — ALBUTEROL SULFATE (2.5 MG/3ML) 0.083% IN NEBU: 2.5000 mg | INHALATION_SOLUTION | RESPIRATORY_TRACT | Status: DC | PRN

## 2019-06-27 MED ORDER — POLYETHYLENE GLYCOL 3350 17 G PO PACK: 17.0000 g | PACK | Freq: Every day | ORAL | Status: DC | PRN

## 2019-06-27 MED ORDER — SODIUM CHLORIDE 0.9% FLUSH: 3.0000 mL | Freq: Once | INTRAVENOUS | Status: DC

## 2019-06-27 MED ORDER — ONDANSETRON HCL 4 MG/2ML IJ SOLN: 4.0000 mg | Freq: Four times a day (QID) | INTRAMUSCULAR | Status: DC | PRN

## 2019-06-27 MED ORDER — SODIUM CHLORIDE 0.9 % IV SOLN
2.0000 g | Freq: Once | INTRAVENOUS | Status: AC
  Administered 2019-06-27: 16:00:00 2 g via INTRAVENOUS
  Filled 2019-06-27: qty 20

## 2019-06-27 NOTE — ED Provider Notes (Signed)
[facility] Emergency Department Provider Note  ____________________________________________  Time seen: Approximately 5:42 PM  I have reviewed the triage vital signs and the nursing notes.   HISTORY  Chief Complaint Abdominal Pain    HPI Amber Kirk is a 85 y.o. female  with a history of asthma who sent to the ED complaining of generalized abdominal pain and inability to eat or drink for the past week with constipation.  She tried to eat breakfast this morning and vomited immediately.  No fever.  Pain is constant, waxing and waning, no aggravating or alleviating factors.  She also reports she was told she has a T12 compression fracture. She denies any acute trauma or falls, but does note that she has back pain in that area.     Past Medical History:  Diagnosis Date  . Allergy   . Asthma      Patient Active Problem List   Diagnosis Date Noted  . Colitis 06/27/2019  . Chronic venous insufficiency 07/04/2016  . Lymphedema 07/04/2016  . Asthma 07/04/2016  . Hyperlipidemia 07/04/2016     Past Surgical History:  Procedure Laterality Date  . ABDOMINAL HYSTERECTOMY    . APPENDECTOMY    . BLADDER SURGERY    . CHOLECYSTECTOMY    . TONSILLECTOMY       Prior to Admission medications   Medication Sig Start Date End Date Taking? Authorizing Provider  albuterol (PROVENTIL) (2.5 MG/3ML) 0.083% nebulizer solution  03/01/14   [provider]  Aloe Vera 25 MG CAPS aloe vera 25 mg capsule    [provider]  aspirin EC 81 MG tablet Take 81 mg by mouth. 04/25/12   [provider]  azelastine (ASTELIN) 0.1 % nasal spray azelastine 137 mcg (0.1 %) nasal spray aerosol    [provider]  calcium carbonate (OS-CAL) 600 MG tablet Take by mouth.    [provider]  cetirizine (ZYRTEC) 10 MG tablet Take by mouth.    [provider]  Cholecalciferol (VITAMIN D-1000 MAX ST) 1000 units tablet Take by  mouth.    [provider]  diclofenac sodium (VOLTAREN) 1 % GEL  06/22/16   [provider]  dicyclomine (BENTYL) 10 MG capsule dicyclomine 10 mg capsule    [provider]  diphenhydrAMINE (BENADRYL) 25 mg capsule Take by mouth.    [provider]  docusate calcium (SURFAK) 240 MG capsule Take by mouth.    [provider]  doxepin (SINEQUAN) 25 MG capsule  04/22/16   [provider]  ELMIRON 100 MG capsule  05/19/16   [provider]  fluticasone Asencion Islam) 50 MCG/ACT nasal spray  05/28/19   [provider]  hydroxychloroquine (PLAQUENIL) 200 MG tablet  04/22/16   [provider]  LINZESS 145 MCG CAPS capsule Take 145 mcg by mouth daily. 06/25/19   [provider]  lovastatin (MEVACOR) 40 MG tablet  05/19/16   [provider]  mometasone (NASONEX) 50 MCG/ACT nasal spray Frequency:QD   Dosage:50   MCG  Instructions:  Note:Dose: 50MCG 04/18/12   [provider]  montelukast (SINGULAIR) 10 MG tablet  04/18/16   [provider]  Multiple Vitamin (MULTI-VITAMINS) TABS Take by mouth. 04/18/12   [provider]  nitrofurantoin, macrocrystal-monohydrate, (MACROBID) 100 MG capsule  03/24/18   [provider]  Olopatadine HCl 0.2 % SOLN  03/01/14   [provider]  omeprazole (PRILOSEC) 20 MG capsule  05/19/16   [provider]  ondansetron (ZOFRAN) 4 MG tablet ondansetron HCl 4 mg tablet    [provider]  pentosan polysulfate (ELMIRON) 100 MG capsule  05/25/09   [provider]  Red Yeast Rice Extract 600 MG CAPS red yeast rice 600 mg capsule    [provider]  SYMBICORT 160-4.5 MCG/ACT inhaler  06/16/16   [provider]  tiZANidine (ZANAFLEX) 4 MG tablet tizanidine 4 mg tablet    [provider]  trospium (SANCTURA) 20 MG tablet Take 20 mg by mouth at bedtime. 06/11/19   [provider]      Allergies Penicillins, Morphine and related, Chlorzoxazone, Ciprofloxacin, Clarithromycin, Clindamycin hcl, Codeine sulfate, Conjugated estrogens, Hydrocodone-homatropine, Hydromorphone, Lidocaine, Meperidine, Propoxyphene, Sulfa antibiotics, and Tioconazole   Family History  Problem Relation Age of Onset  . Heart disease Mother   . Stroke Father   . Breast cancer Neg Hx     Social History Social History   Tobacco Use  . Smoking status: Never Smoker  . Smokeless tobacco: Never Used  Substance Use Topics  . Alcohol use: No  . Drug use: No    Review of Systems  Constitutional:   No fever or chills.  ENT:   No sore throat. No rhinorrhea. Cardiovascular:   No chest pain or syncope. Respiratory:   No dyspnea or cough. Gastrointestinal: Positive abdominal pain, constipation, and vomiting Musculoskeletal: Positive subacute mid back pain All other systems reviewed and are negative except as documented above in ROS and HPI.  ____________________________________________   PHYSICAL EXAM:  VITAL SIGNS: ED Triage Vitals [06/27/19 1357]  Enc Vitals Group     BP (!) 115/55     Pulse Rate (!) 106     Resp 18     Temp 98.9 F (37.2 C)     Temp src      SpO2 94 %     Weight 200 lb (90.7 kg)     Height 4\' 11"  (1.499 m)     Head Circumference      Peak Flow      Pain Score 5     Pain Loc      Pain Edu?      Excl. in [place]?     Vital signs reviewed, nursing assessments reviewed.   Constitutional:   Alert and oriented. Non-toxic appearance. Eyes:   Conjunctivae are normal. EOMI. PERRL. ENT      Head:   Normocephalic and atraumatic.      Nose:   Wearing a mask.      Mouth/Throat:   Wearing a mask.      Neck:   No meningismus. Full ROM. Hematological/Lymphatic/Immunilogical:   No cervical lymphadenopathy. Cardiovascular:   RRR. Symmetric bilateral radial and DP pulses.  No murmurs. Cap refill less than 2 seconds. Respiratory:   Normal respiratory effort without  tachypnea/retractions. Breath sounds are clear and equal bilaterally. No wheezes/rales/rhonchi. Gastrointestinal:   Soft with mild generalized tenderness, nonfocal.  Moderately distended.  Not tympanic.   No rebound, rigidity, or guarding. Musculoskeletal:   Normal range of motion in all extremities. No joint effusions.  No lower extremity tenderness.  No edema.  There is some tenderness in the area of T12 without point tenderness, no soft tissue tenderness or inflammatory changes. Neurologic:   Normal speech and language.  Motor grossly intact. No acute focal neurologic deficits are appreciated.  Skin:    Skin is warm, dry and intact. No rash noted.  No petechiae, purpura, or bullae.  ____________________________________________  LABS (pertinent positives/negatives) (all labs ordered are listed, but only abnormal results are displayed) Labs Reviewed  COMPREHENSIVE METABOLIC PANEL - Abnormal; Notable for the following components:      Result Value   Sodium 131 (*)    Chloride 85 (*)    CO2 34 (*)    Glucose, Bld 137 (*)    BUN 30 (*)    Creatinine, Ser 1.02 (*)    GFR calc non Af Amer 51 (*)    GFR calc Af Amer 59 (*)    All other components within normal limits  CBC - Abnormal; Notable for the following components:   WBC 15.2 (*)    All other components within normal limits  URINALYSIS, COMPLETE (UACMP) WITH MICROSCOPIC - Abnormal; Notable for the following components:   Color, Urine YELLOW (*)    APPearance HAZY (*)    Specific Gravity, Urine 1.032 (*)    Protein, ur 30 (*)    Leukocytes,Ua SMALL (*)    All other components within normal limits  RESPIRATORY PANEL BY RT PCR (FLU A&B, COVID)  LIPASE, BLOOD  BASIC METABOLIC PANEL  CBC   ____________________________________________   EKG    ____________________________________________    RADIOLOGY  No results  found.  ____________________________________________   PROCEDURES Procedures  ____________________________________________    CLINICAL IMPRESSION / ASSESSMENT AND PLAN / ED COURSE  Medications ordered in the ED: Medications  sodium chloride flush (NS) 0.9 % injection 3 mL (3 mLs Intravenous Refused 06/27/19 1627)  aspirin EC tablet 81 mg (has no administration in time range)  montelukast (SINGULAIR) tablet 10 mg (has no administration in time range)  mometasone-formoterol (DULERA) 200-5 MCG/ACT inhaler 2 puff (has no administration in time range)  enoxaparin (LOVENOX) injection 40 mg (has no administration in time range)  lactated ringers infusion (has no administration in time range)  acetaminophen (TYLENOL) tablet 650 mg (has no administration in time range)    Or  acetaminophen (TYLENOL) suppository 650 mg (has no administration in time range)  traZODone (DESYREL) tablet 25 mg (has no administration in time range)  docusate sodium (COLACE) capsule 100 mg (has no administration in time range)  senna (SENOKOT) tablet 8.6 mg (has no administration in time range)  bisacodyl (DULCOLAX) suppository 10 mg (has no administration in time range)  ondansetron (ZOFRAN) tablet 4 mg (has no administration in time range)    Or  ondansetron (ZOFRAN) injection 4 mg (has no administration in time range)  albuterol (PROVENTIL) (2.5 MG/3ML) 0.083% nebulizer solution 2.5 mg (has no administration in time range)  hydrALAZINE (APRESOLINE) injection 10 mg (has no administration in time range)  pantoprazole (PROTONIX) injection 40 mg (has no administration in time range)  metroNIDAZOLE (FLAGYL) IVPB 500 mg (has no administration in time range)  cefTRIAXone (ROCEPHIN) 2 g in sodium chloride 0.9 % 100 mL IVPB (has no administration in time range)  polyethylene glycol (MIRALAX / GLYCOLAX) packet 17 g (has no administration in time range)  hydroxychloroquine (PLAQUENIL) tablet 200 mg (has no  administration in time range)  pentosan polysulfate (ELMIRON) capsule 100 mg (has no administration in time range)  ondansetron (ZOFRAN) injection 4 mg (4 mg Intravenous Given 06/27/19 1626)  sodium chloride 0.9 % bolus 500 mL (0 mLs Intravenous Stopped 06/27/19 1700)  cefTRIAXone (ROCEPHIN) 2 g in sodium chloride 0.9 % 100 mL IVPB (0 g Intravenous Stopped 06/27/19 1700)  pantoprazole (PROTONIX) injection 40 mg (40 mg Intravenous Given 06/27/19 1626)    Pertinent labs & imaging results that  were available during my care of the patient were reviewed by me and considered in my medical decision making (see chart for details).  Amber Kirk was evaluated in Emergency Department on 06/27/2019 for the symptoms described in the history of present illness. She was evaluated in the context of the global COVID-19 pandemic, which necessitated consideration that the patient might be at risk for infection with the SARS-CoV-2 virus that causes COVID-19. Institutional protocols and algorithms that pertain to the evaluation of patients at risk for COVID-19 are in a state of rapid change based on information released by regulatory bodies including the CDC and federal and state organizations. These policies and algorithms were followed during the patient's care in the ED.   Patient presents with abdominal pain constipation and vomiting, unable to eat or drink for the past week.  Outpatient CT obtained yesterday shows evidence of colitis and ileus.  With her symptoms and exam, the ileus appears to be causing obstructive pathology, so we will place an NG tube and admit for further management including hydration and bowel rest.  She does have exam tenderness to correlate with the finding of a T12 endplate fracture on CT.  No evidence of acute infectious process in this area.  Case discussed with the hospitalist who request surgical input.  Clinical Course as of Jun 27 1747  Thu Jun 27, 2019  1715 Case  reviewed with surgery who advises no alarming features, agree with current medical mgmt.     [PS]    Clinical Course User Index [PS] Carrie Mew, MD     ____________________________________________   FINAL CLINICAL IMPRESSION(S) / ED DIAGNOSES    Final diagnoses:  Ileus [facility])  Colitis     ED Discharge Orders    None      Portions of this note were generated with dragon dictation software. Dictation errors may occur despite best attempts at proofreading.   Carrie Mew, MD 06/27/19 [phone]

## 2019-06-27 NOTE — ED Triage Notes (Addendum)
Pt comes via POV from home with c/o abdominal pain that started last week. Pt states last BM was last Friday. Pt states some nausea and vomiting.  Pt states she went PCP Tuesday and has scan performed yesterday that revealed: Signs of colitis, associated with ileus.  Inferior endplate fracture of the T12 level with some surrounding stranding.   Pt states some trouble peeing as well.

## 2019-06-27 NOTE — ED Notes (Signed)
Patient assisted to bathroom with one assist. Urine sample sent to lab.

## 2019-06-27 NOTE — ED Notes (Signed)
This RN attempted to call report at this time.  

## 2019-06-27 NOTE — H&P (Signed)
History and Physical    Amber Kirk [id number] DOB: March 25, 1934 DOA: 06/27/2019  PCP: [name], MD  Patient coming from: home  I have personally briefly reviewed patient's old medical records in [place]  Chief Complaint: abdominal pain  HPI: Amber Kirk is a 85 y.o. female with medical history significant of chronic low back pain, rheumatoid arthritis on hydroxychloroquine, GERD, interstitial cystitis, hyperlipidemia, depression, chronic iron deficiency anemia who presents to the emergency department after having several day history of constipation and nausea and poor p.o. intake.  The patient had a outpatient CT scan demonstrating evidence of colitis with associated ileus.  No obstruction noted.  Patient was directed to the emergency department.  On initial presentation in the emergency department the patient's clinical presentation is relatively consistent with an obstructive process.  Patient is hemodynamically stable not in acute pain however she does endorse retching and vomiting prior to presentation.  Case was discussed with ED physician who will place NG tube in the emergency department and notify general surgery for any further recommendations.  Also notable on CAT scan was a compression fracture T12 vertebrae.  Discussed with patient.  Patient has had issues in with low back pain for years.  Primary care doctor follows.  Also sees rheumatology Dr. Lyndon Code for these issues.   ED Course: Imaging reviewed by ED doctor.  Started Rocephin and metronidazole for possible infectious colitis.  1 L IV fluids given.  IV Protonix given.  NG tube to be placed.  Hospitalist called for admission.  Review of Systems: As per HPI otherwise 10 point review of systems negative.    Past Medical History:  Diagnosis Date  . Allergy   . Asthma     Past Surgical History:  Procedure Laterality Date  . ABDOMINAL HYSTERECTOMY    . APPENDECTOMY    .  BLADDER SURGERY    . CHOLECYSTECTOMY    . TONSILLECTOMY       reports that she has never smoked. She has never used smokeless tobacco. She reports that she does not drink alcohol or use drugs.  Allergies  Allergen Reactions  . Penicillins Hives, Rash and Other (See Comments)    And itching And itching  . Morphine And Related Nausea And Vomiting and Nausea Only  . Chlorzoxazone Other (See Comments)  . Ciprofloxacin Other (See Comments)  . Clarithromycin Other (See Comments)  . Clindamycin Hcl Other (See Comments)  . Codeine Sulfate Other (See Comments)  . Conjugated Estrogens Other (See Comments)  . Hydrocodone-Homatropine Other (See Comments)  . Hydromorphone Other (See Comments)  . Lidocaine Other (See Comments)  . Meperidine Other (See Comments)  . Propoxyphene Other (See Comments)  . Sulfa Antibiotics Other (See Comments)  . Tioconazole Other (See Comments)    Family History  Problem Relation Age of Onset  . Heart disease Mother   . Stroke Father   . Breast cancer Neg Hx      Prior to Admission medications   Medication Sig Start Date End Date Taking? Authorizing Provider  albuterol (PROVENTIL) (2.5 MG/3ML) 0.083% nebulizer solution  03/01/14   [provider]  aspirin EC 81 MG tablet Take 81 mg by mouth. 04/25/12   [provider]  calcium carbonate (OS-CAL) 600 MG tablet Take by mouth.    [provider]  Cholecalciferol (VITAMIN D-1000 MAX ST) 1000 units tablet Take by mouth.    [provider]  diclofenac sodium (VOLTAREN) 1 % GEL  06/22/16  [provider]  diphenhydrAMINE (BENADRYL) 25 mg capsule Take by mouth.    [provider]  doxepin (SINEQUAN) 25 MG capsule  04/22/16   [provider]  ELMIRON 100 MG capsule  05/19/16   [provider]  hydroxychloroquine (PLAQUENIL) 200 MG tablet  04/22/16   [provider]  lovastatin (MEVACOR) 40 MG tablet  05/19/16   [provider]    mometasone (NASONEX) 50 MCG/ACT nasal spray Frequency:QD   Dosage:50   MCG  Instructions:  Note:Dose: 50MCG 04/18/12   [provider]  montelukast (SINGULAIR) 10 MG tablet  04/18/16   [provider]  Multiple Vitamin (MULTI-VITAMINS) TABS Take by mouth. 04/18/12   [provider]  nitrofurantoin, macrocrystal-monohydrate, (MACROBID) 100 MG capsule  03/24/18   [provider]  Olopatadine HCl 0.2 % SOLN  03/01/14   [provider]  omeprazole (PRILOSEC) 20 MG capsule  05/19/16   [provider]  pentosan polysulfate (ELMIRON) 100 MG capsule  05/25/09   [provider]  [facility] 160-4.5 MCG/ACT inhaler  06/16/16   [provider]    Physical Exam: Vitals:   06/27/19 1357  BP: (!) 115/55  Pulse: (!) 106  Resp: 18  Temp: 98.9 F (37.2 C)  SpO2: 94%  Weight: 90.7 kg  Height: 4\' 11"  (1.499 m)     Vitals:   06/27/19 1357  BP: (!) 115/55  Pulse: (!) 106  Resp: 18  Temp: 98.9 F (37.2 C)  SpO2: 94%  Weight: 90.7 kg  Height: 4\' 11"  (1.499 m)   Constitutional: NAD, calm, comfortable Eyes: PERRL, lids and conjunctivae normal ENMT: Mucous membranes are moist. Posterior pharynx clear of any exudate or lesions.Normal dentition.  Neck: normal, supple, no masses, no thyromegaly Respiratory: clear to auscultation bilaterally, no wheezing, no crackles. Normal respiratory effort. No accessory muscle use.  Cardiovascular: 2/6 systolic murmur.  Regular rate and rhythm.  No pedal edema Abdomen: Distended, mild tender to palpation, decreased bowel sounds  musculoskeletal: no clubbing / cyanosis. No joint deformity upper and lower extremities. Good ROM, no contractures. Normal muscle tone.  Skin: no rashes, lesions, ulcers. No induration Neurologic: CN 2-12 grossly intact. Sensation intact, DTR normal. Strength 5/5 in all 4.  Psychiatric: Normal judgment and insight. Alert and oriented x 3. Normal mood.   Labs on Admission: I  have personally reviewed following labs and imaging studies  CBC: Recent Labs  Lab 06/27/19 1402  WBC 15.2*  HGB 13.5  HCT 40.8  MCV 80.6  PLT 123456   Basic Metabolic Panel: Recent Labs  Lab 06/27/19 1402  NA 131*  K 4.2  CL 85*  CO2 34*  GLUCOSE 137*  BUN 30*  CREATININE 1.02*  CALCIUM 9.4   GFR: Estimated Creatinine Clearance: 41 mL/min (A) (by C-G formula based on SCr of 1.02 mg/dL (H)). Liver Function Tests: Recent Labs  Lab 06/27/19 1402  AST 26  ALT 21  ALKPHOS 81  BILITOT 0.9  PROT 7.0  ALBUMIN 3.7   Recent Labs  Lab 06/27/19 1402  LIPASE 14   No results for input(s): AMMONIA in the last 168 hours. Coagulation Profile: No results for input(s): INR, PROTIME in the last 168 hours. Cardiac Enzymes: No results for input(s): CKTOTAL, CKMB, CKMBINDEX, TROPONINI in the last 168 hours. BNP (last 3 results) No results for input(s): PROBNP in the last 8760 hours. HbA1C: No results for input(s): HGBA1C in the last 72 hours. CBG: No results for input(s): GLUCAP in the last  168 hours. Lipid Profile: No results for input(s): CHOL, HDL, LDLCALC, TRIG, CHOLHDL, LDLDIRECT in the last 72 hours. Thyroid Function Tests: No results for input(s): TSH, T4TOTAL, FREET4, T3FREE, THYROIDAB in the last 72 hours. Anemia Panel: No results for input(s): VITAMINB12, FOLATE, FERRITIN, TIBC, IRON, RETICCTPCT in the last 72 hours. Urine analysis:    Component Value Date/Time   COLORURINE YELLOW (A) 06/27/2019 1431   APPEARANCEUR HAZY (A) 06/27/2019 1431   LABSPEC 1.032 (H) 06/27/2019 1431   PHURINE 6.0 06/27/2019 1431   GLUCOSEU NEGATIVE 06/27/2019 1431   HGBUR NEGATIVE 06/27/2019 1431   BILIRUBINUR NEGATIVE 06/27/2019 1431   KETONESUR NEGATIVE 06/27/2019 1431   PROTEINUR 30 (A) 06/27/2019 1431   NITRITE NEGATIVE 06/27/2019 1431   LEUKOCYTESUR SMALL (A) 06/27/2019 1431    Radiological Exams on Admission: CT ABDOMEN PELVIS W CONTRAST  Addendum Date: 06/27/2019     ADDENDUM REPORT: 06/27/2019 12:17 ADDENDUM: Compression fracture with stranding related to acute features is favored. Correlate with any clinical signs of infection which is felt less likely given the appearance of the fracture. Follow-up MRI may be helpful. These results will be called to the ordering clinician or representative by the Radiologist Assistant, and communication documented in the PACS or [employer]. Electronically Signed   By: Zetta Bills M.D.   On: 06/27/2019 12:17   Addendum Date: 06/27/2019   ADDENDUM REPORT: 06/27/2019 10:16 ADDENDUM: These results were called by telephone at the time of interpretation on 06/27/2019 at 10:14 am to provider [facility] , who verbally acknowledged these results. Electronically Signed   By: Zetta Bills M.D.   On: 06/27/2019 10:16   Result Date: 06/27/2019 CLINICAL DATA:  Constipation. No recent bowel movement. EXAM: CT ABDOMEN AND PELVIS WITH CONTRAST TECHNIQUE: Multidetector CT imaging of the abdomen and pelvis was performed using the standard protocol following bolus administration of intravenous contrast. CONTRAST:  128mL OMNIPAQUE IOHEXOL 350 MG/ML SOLN COMPARISON:  01/28/2010, CT chest from 2015 FINDINGS: Lower chest: 4 mm right lower lobe nodule not changed since 2015. Basilar atelectasis. No evidence of effusion. Hepatobiliary: Liver is normal size. No focal, suspicious hepatic lesion. Post cholecystectomy with mild biliary ductal dilation similar to previous imaging, from 2011. Pancreas: Pancreas is normal without focal lesion or inflammation. Spleen: Spleen is normal size without focal lesion. Adrenals/Urinary Tract: Adrenal glands are normal. Symmetric bilateral renal enhancement. No focal, suspicious renal lesion. Stomach/Bowel: Partial filling of small bowel loops with positive contrast media. In the mid small bowel there is a decompressed appearance of small bowel loops. This may simply represent timing of contrast. Is the ileum is  moderately dilated passing into a wide-mouth ventral hernia. Liquid stool fills the colon from the descending colon back to the cecum with similar appearance of material and dilated distal ileum. Mild pericolonic stranding is noted with a small amount of pericolonic fluid particularly along the ascending colon. Vascular/Lymphatic: Calcified and noncalcified atheromatous plaque in the abdominal aorta. No adenopathy in the upper abdomen or retroperitoneum. No pelvic lymphadenopathy. Reproductive: Post hysterectomy. Other: Trace stranding and fluid about the colon. Musculoskeletal: Inferior endplate fracture of the T12 level with some surrounding stranding. Mild loss of height and mild widening of the anterior disc space. Stranding extends along the anterior margin of the T12 vertebral body. Sacral nerve stimulator in place.  Entering via left S2 level. IMPRESSION: 1. Signs of colitis, associated with ileus. Is correlate with recent antibiotic administration or other risk factors for infectious colitis. Transition point occurs at the level of  formed stool in the colon but there is only a moderate amount of stool in the descending colon and none in the rectum at this time. Lactate correlation may also be helpful. 2. Inferior endplate fracture of the T12 level with some surrounding stranding. Mild loss of height and mild widening of the anterior disc space. Acute or subacute fracture is considered. MRI may be helpful given the widening of the anterior disc space and surrounding stranding. Correlate with any history of trauma or symptoms of infection given the degree of stranding which seems out of proportion to the associated injury. Is. 3. Aortic atherosclerosis. A call is out to the referring provider to further discuss findings in the above case. Electronically Signed: By: Zetta Bills M.D. On: 06/27/2019 10:05    EKG: Not performed in ED  Assessment/Plan Active Problems:   Colitis  Acute  colitis Ileus Suspect acute colitis secondary to ileus Infectious and inflammatory lower on differential Suspect stercoral colitis Patient hemodynamically stable and not retching at time of my evaluation Plan: Admit inpatient, MedSurg status Aggressive IV fluids N.p.o. NGT to be placed in ED, set to low intermittent suction, DC when ileus resolves ED physician to notify general surgery consult Rocephin 1 g every 24 hours Metronidazole 500 mg every 8 hours IV acid suppression IV antiemetics Aggressive bowel regimen: Docusate 1 tab twice daily Senokot 1 tab twice daily MiraLAX 17 g daily Dulcolax suppository 10 mg daily Monitor for BM  T12 compression fracture Noted on CT Patient with history of back pain Pain control as needed Avoid narcotics Outpatient follow-up  Rheumatoid arthritis Continue home Plaquenil  Asthma Not currently exacerbated Continue home nebulizer and inhaler regimen Supplemental oxygen as needed  GERD IV antiemetics  Interstitial cystitis Continue home Elmiron  Hyperlipidemia Currently statin on hold  Chronic iron deficiency anemia Hemoglobin at baseline Holding home iron sulfate  Obesity stage III, BMI 40.4 Counseled patient Nutrition consult      DVT prophylaxis:  Lovenox Code Status: Full Family Communication: Daughter at bedside Disposition Plan: return back to previous home environment Consults called:  Admission status: inpatient, med surg   Sidney Ace MD [facility] Pager [phone]   If 7PM-7AM, please contact night-coverage  06/27/2019, 4:35 PM

## 2019-06-27 NOTE — ED Notes (Signed)
Flagyl medication not stocked in ED pixis. Message sent to pharmacy about missing medication.

## 2019-06-28 ENCOUNTER — Encounter: Payer: Self-pay | Admitting: Internal Medicine

## 2019-06-28 DIAGNOSIS — K567 Ileus, unspecified: Secondary | ICD-10-CM

## 2019-06-28 DIAGNOSIS — K5900 Constipation, unspecified: Secondary | ICD-10-CM

## 2019-06-28 DIAGNOSIS — K529 Noninfective gastroenteritis and colitis, unspecified: Secondary | ICD-10-CM

## 2019-06-28 DIAGNOSIS — S22080A Wedge compression fracture of T11-T12 vertebra, initial encounter for closed fracture: Secondary | ICD-10-CM

## 2019-06-28 LAB — CBC
HCT: 39.9 % (ref 36.0–46.0)
Hemoglobin: 12.8 g/dL (ref 12.0–15.0)
MCH: 26.9 pg (ref 26.0–34.0)
MCHC: 32.1 g/dL (ref 30.0–36.0)
MCV: 84 fL (ref 80.0–100.0)
Platelets: 302 10*3/uL (ref 150–400)
RBC: 4.75 MIL/uL (ref 3.87–5.11)
RDW: 13.7 % (ref 11.5–15.5)
WBC: 13.6 10*3/uL — ABNORMAL HIGH (ref 4.0–10.5)
nRBC: 0 % (ref 0.0–0.2)

## 2019-06-28 LAB — BASIC METABOLIC PANEL
Anion gap: 12 (ref 5–15)
BUN: 33 mg/dL — ABNORMAL HIGH (ref 8–23)
CO2: 35 mmol/L — ABNORMAL HIGH (ref 22–32)
Calcium: 8.9 mg/dL (ref 8.9–10.3)
Chloride: 88 mmol/L — ABNORMAL LOW (ref 98–111)
Creatinine, Ser: 1.08 mg/dL — ABNORMAL HIGH (ref 0.44–1.00)
GFR calc Af Amer: 55 mL/min — ABNORMAL LOW (ref >60)
GFR calc non Af Amer: 47 mL/min — ABNORMAL LOW (ref >60)
Glucose, Bld: 128 mg/dL — ABNORMAL HIGH (ref 70–99)
Potassium: 4.5 mmol/L (ref 3.5–5.1)
Sodium: 135 mmol/L (ref 135–145)

## 2019-06-28 LAB — GLUCOSE, CAPILLARY: Glucose-Capillary: 105 mg/dL — ABNORMAL HIGH (ref 70–99)

## 2019-06-28 MED ORDER — ENOXAPARIN SODIUM 40 MG/0.4ML ~~LOC~~ SOLN
40.0000 mg | Freq: Two times a day (BID) | SUBCUTANEOUS | Status: DC
  Administered 2019-06-28 – 2019-06-29 (×3): 40 mg via SUBCUTANEOUS
  Filled 2019-06-28 (×3): qty 0.4

## 2019-06-28 MED ORDER — BISACODYL 10 MG RE SUPP
10.0000 mg | Freq: Once | RECTAL | Status: AC
  Administered 2019-06-28: 15:00:00 10 mg via RECTAL
  Filled 2019-06-28: qty 1

## 2019-06-28 MED ORDER — FLEET ENEMA 7-19 GM/118ML RE ENEM
1.0000 | ENEMA | Freq: Once | RECTAL | Status: AC
  Administered 2019-06-28: 15:00:00 1 via RECTAL

## 2019-06-28 MED ORDER — POLYETHYLENE GLYCOL 3350 17 G PO PACK: 17.0000 g | PACK | Freq: Two times a day (BID) | ORAL | Status: DC

## 2019-06-28 NOTE — Progress Notes (Signed)
[facility] [place] NAME: Amber Kirk    MR#:  [id number]  DATE OF BIRTH:  1935-02-15  SUBJECTIVE:   Patient came in with abdominal pain. She has history of constipation for several days. Poor PO intake. Gurgling of her stomach. Says she" could be passing gas"  NG tube placed. Not much output--about 170cc since yday REVIEW OF SYSTEMS:   Review of Systems  Constitutional: Negative for chills, fever and weight loss.  HENT: Negative for ear discharge, ear pain and nosebleeds.   Eyes: Negative for blurred vision, pain and discharge.  Respiratory: Negative for sputum production, shortness of breath, wheezing and stridor.   Cardiovascular: Negative for chest pain, palpitations, orthopnea and PND.  Gastrointestinal: Positive for abdominal pain and constipation. Negative for diarrhea, nausea and vomiting.  Genitourinary: Negative for frequency and urgency.  Musculoskeletal: Negative for back pain and joint pain.  Neurological: Positive for weakness. Negative for sensory change, speech change and focal weakness.  Psychiatric/Behavioral: Negative for depression and hallucinations. The patient is not nervous/anxious.    Tolerating Diet:npo Tolerating PT: pending  DRUG ALLERGIES:   Allergies  Allergen Reactions  . Penicillins Hives, Rash and Other (See Comments)    And itching And itching  . Morphine And Related Nausea And Vomiting and Nausea Only  . Chlorzoxazone Other (See Comments)  . Ciprofloxacin Other (See Comments)  . Clarithromycin Other (See Comments)  . Clindamycin Hcl Other (See Comments)  . Codeine Sulfate Other (See Comments)  . Conjugated Estrogens Other (See Comments)  . Hydrocodone-Homatropine Other (See Comments)  . Hydromorphone Other (See Comments)  . Lidocaine Other (See Comments)  . Meperidine Other (See Comments)  . Propoxyphene Other (See Comments)  . Sulfa Antibiotics Other (See Comments)  . Tioconazole  Other (See Comments)    VITALS:  Blood pressure (!) 128/51, pulse 87, temperature 98.5 F (36.9 C), resp. rate 17, height 4\' 11"  (1.499 m), weight 90.7 kg, SpO2 91 %.  PHYSICAL EXAMINATION:   Physical Exam  GENERAL:  85 y.o.-year-old patient lying in the bed with no acute distress. Obese EYES: Pupils equal, round, reactive to light and accommodation. No scleral icterus.   HEENT: Head atraumatic, normocephalic. Oropharynx and nasopharynx clear. NG+ NECK:  Supple, no jugular venous distention. No thyroid enlargement, no tenderness.  LUNGS: Normal breath sounds bilaterally, no wheezing, rales, rhonchi. No use of accessory muscles of respiration.  CARDIOVASCULAR: S1, S2 normal. No murmurs, rubs, or gallops.  ABDOMEN: Soft, nontender, nondistended.Abdominal obesity+ cannot much appreciate Bowel sounds present. No organomegaly or mass.  EXTREMITIES: No cyanosis, clubbing or edema b/l.    NEUROLOGIC: Cranial nerves II through XII are intact. No focal Motor or sensory deficits b/l.   PSYCHIATRIC:  patient is alert and oriented x 3.  SKIN: No obvious rash, lesion, or ulcer.   LABORATORY PANEL:  CBC Recent Labs  Lab 06/28/19 0446  WBC 13.6*  HGB 12.8  HCT 39.9  PLT 302    Chemistries  Recent Labs  Lab 06/27/19 1402 06/27/19 1402 06/28/19 0446  NA 131*   < > 135  K 4.2   < > 4.5  CL 85*   < > 88*  CO2 34*   < > 35*  GLUCOSE 137*   < > 128*  BUN 30*   < > 33*  CREATININE 1.02*   < > 1.08*  CALCIUM 9.4   < > 8.9  AST 26  --   --   ALT  21  --   --   ALKPHOS 81  --   --   BILITOT 0.9  --   --    < > = values in this interval not displayed.   Cardiac Enzymes No results for input(s): TROPONINI in the last 168 hours. RADIOLOGY:  DG Abdomen 1 View  Result Date: 06/27/2019 CLINICAL DATA:  Nasogastric tube placement. EXAM: ABDOMEN - 1 VIEW COMPARISON:  CT, 06/26/2019 FINDINGS: Nasogastric tube passes well below the diaphragm, tip projecting in the distal stomach. IMPRESSION:  Well-positioned nasogastric tube. Electronically Signed   By: Lajean Manes M.D.   On: 06/27/2019 18:12   CT ABDOMEN PELVIS W CONTRAST  Addendum Date: 06/27/2019   ADDENDUM REPORT: 06/27/2019 12:17 ADDENDUM: Compression fracture with stranding related to acute features is favored. Correlate with any clinical signs of infection which is felt less likely given the appearance of the fracture. Follow-up MRI may be helpful. These results will be called to the ordering clinician or representative by the Radiologist Assistant, and communication documented in the PACS or [employer]. Electronically Signed   By: Zetta Bills M.D.   On: 06/27/2019 12:17   Addendum Date: 06/27/2019   ADDENDUM REPORT: 06/27/2019 10:16 ADDENDUM: These results were called by telephone at the time of interpretation on 06/27/2019 at 10:14 am to provider [facility] , who verbally acknowledged these results. Electronically Signed   By: Zetta Bills M.D.   On: 06/27/2019 10:16   Result Date: 06/27/2019 CLINICAL DATA:  Constipation. No recent bowel movement. EXAM: CT ABDOMEN AND PELVIS WITH CONTRAST TECHNIQUE: Multidetector CT imaging of the abdomen and pelvis was performed using the standard protocol following bolus administration of intravenous contrast. CONTRAST:  118mL OMNIPAQUE IOHEXOL 350 MG/ML SOLN COMPARISON:  01/28/2010, CT chest from 2015 FINDINGS: Lower chest: 4 mm right lower lobe nodule not changed since 2015. Basilar atelectasis. No evidence of effusion. Hepatobiliary: Liver is normal size. No focal, suspicious hepatic lesion. Post cholecystectomy with mild biliary ductal dilation similar to previous imaging, from 2011. Pancreas: Pancreas is normal without focal lesion or inflammation. Spleen: Spleen is normal size without focal lesion. Adrenals/Urinary Tract: Adrenal glands are normal. Symmetric bilateral renal enhancement. No focal, suspicious renal lesion. Stomach/Bowel: Partial filling of small bowel loops with  positive contrast media. In the mid small bowel there is a decompressed appearance of small bowel loops. This may simply represent timing of contrast. Is the ileum is moderately dilated passing into a wide-mouth ventral hernia. Liquid stool fills the colon from the descending colon back to the cecum with similar appearance of material and dilated distal ileum. Mild pericolonic stranding is noted with a small amount of pericolonic fluid particularly along the ascending colon. Vascular/Lymphatic: Calcified and noncalcified atheromatous plaque in the abdominal aorta. No adenopathy in the upper abdomen or retroperitoneum. No pelvic lymphadenopathy. Reproductive: Post hysterectomy. Other: Trace stranding and fluid about the colon. Musculoskeletal: Inferior endplate fracture of the T12 level with some surrounding stranding. Mild loss of height and mild widening of the anterior disc space. Stranding extends along the anterior margin of the T12 vertebral body. Sacral nerve stimulator in place.  Entering via left S2 level. IMPRESSION: 1. Signs of colitis, associated with ileus. Is correlate with recent antibiotic administration or other risk factors for infectious colitis. Transition point occurs at the level of formed stool in the colon but there is only a moderate amount of stool in the descending colon and none in the rectum at this time. Lactate correlation may also be  helpful. 2. Inferior endplate fracture of the T12 level with some surrounding stranding. Mild loss of height and mild widening of the anterior disc space. Acute or subacute fracture is considered. MRI may be helpful given the widening of the anterior disc space and surrounding stranding. Correlate with any history of trauma or symptoms of infection given the degree of stranding which seems out of proportion to the associated injury. Is. 3. Aortic atherosclerosis. A call is out to the referring provider to further discuss findings in the above case.  Electronically Signed: By: Zetta Bills M.D. On: 06/27/2019 10:05   ASSESSMENT AND PLAN:  Amber Kirk is a 85 y.o. female with medical history significant of chronic low back pain, rheumatoid arthritis on hydroxychloroquine, GERD, interstitial cystitis, hyperlipidemia, depression, chronic iron deficiency anemia who presents to the emergency department after having several day history of constipation and nausea and poor p.o. intake.  Acute colitis Ileus/constipation for 7 days Suspect mild acute colitis secondary to ileus from contipation N.p.o. NGT to be  Clamped since not much output. - will give enema, suppository and oral meds to help relieve constipation  nED - - Rocephin 1 g every 24 hours and Metronidazole 500 mg every 8 hours -IV acid suppression -IV antiemetics prn Aggressive bowel regimen: -G.I. and surgical consultation if no improvement.  T12 compression fracture Noted on CT Patient with history of back pain Pain control as needed Avoid narcotics have sent message to dr Rudene Christians orthopedic to see if he can see patient next week  Rheumatoid arthritis Continue home Plaquenil  Asthma Not currently exacerbated Continue home nebulizer and inhaler regimen Supplemental oxygen as needed  GERD IV antiemetics  Interstitial cystitis Continue home Elmiron  Hyperlipidemia Currently statin on hold  Chronic iron deficiency anemia Hemoglobin at baseline Holding home iron sulfate  Obesity stage III, BMI 40.4  Generalized weakness with back pain. Physical therapy to see patient    DVT prophylaxis:  Lovenox Code Status: Full Family Communication: pt  Disposition Plan: return back to previous home environment Consults called:  Admission status: inpatient, med surg  Status is: Inpatient  Remains inpatient appropriate because: patient has significant ileus/constipation. She has compression T12 fracture which needs to be evaluated by orthopedic  next week   Dispo: The patient is from: Home              Anticipated d/c is to: tbd              Anticipated d/c date is: > 3 days              Patient currently is not medically stable to d/c.   TOTAL TIME TAKING CARE OF THIS PATIENT: 35 minutes.  >50% time spent on counselling and coordination of care  Note: This dictation was prepared with Dragon dictation along with smaller phrase technology. Any transcriptional errors that result from this process are unintentional.  Fritzi Mandes M.D    [facility]   CC: Primary care physician; [name], MDPatient ID: Amber Kirk, female   DOB: 08/26/1934, 85 y.o.   MRN: [id number]

## 2019-06-28 NOTE — Plan of Care (Signed)
Patient is NPO except sips with meds. Alert and oriented, ambulates with one assist to [facility]. NG tube low-intermittent suction. Patient complains of back pain and prefers to sleep in a recliner. Will continue to monitor.  Amber Kirk 06/28/2019  1:58 AM

## 2019-06-28 NOTE — Plan of Care (Signed)
Continuing with plan of care. 

## 2019-06-28 NOTE — Consult Note (Signed)
PHARMACIST - PHYSICIAN COMMUNICATION  CONCERNING:  Enoxaparin (Lovenox) for DVT Prophylaxis    RECOMMENDATION: Patient was prescribed enoxaprin 40mg  q24 hours for VTE prophylaxis.   Filed Weights   06/27/19 1357  Weight: 90.7 kg (200 lb)    Body mass index is 40.4 kg/m.  Estimated Creatinine Clearance: 38.8 mL/min (A) (by C-G formula based on SCr of 1.08 mg/dL (H)).   Based on [place] patient is candidate for enoxaparin 40mg  every 12 hour dosing due to BMI being >40.  DESCRIPTION: Pharmacy has adjusted enoxaparin dose per [facility] policy.  Patient is now receiving enoxaparin 40mg  every 12 hours.   Oswald Hillock, PharmD, BCPS Clinical Pharmacist  06/28/2019 7:59 AM

## 2019-06-28 NOTE — Progress Notes (Signed)
Dr. Posey Pronto ordered for NG tube to be clamped and rechecked for drainage after clamp trial.  NG tube clamped and at end of trial no residual when placed back to intermittent suction.  Dr. Posey Pronto ordered to d/c the NG tube and ok to give ice chips.  Dr. Posey Pronto further stated to start a liquid diet tomorrow.

## 2019-06-28 NOTE — Evaluation (Signed)
Physical Therapy Evaluation Patient Details Name: Amber Kirk MRN: [id number] DOB: 1934-10-01 Today's Date: 06/28/2019   History of Present Illness  Amber Kirk is an 28yoF who comes [facility] 4/22 c ABD pain. Outpatient CT scan demonstrating evidence of colitis with associated ileus. Also notable on CT scan was a compression fracture T12 vertebrae. PMH: low back pain, RA, GERD, HLD, depression, chronic iron deficiency anemia.  Clinical Impression  Pt admitted with above diagnosis. Pt currently with functional limitations due to the deficits listed below (see "PT Problem List"). Spoke to Dr. Posey Pronto who reports plans for ortho to evaluate T12 fracture on Monday. Upon entry, pt in recliner, awake and agreeable to participate. Son at bedside. The pt is alert and oriented x4, pleasant, conversational, and generally a good historian. Pt able to STS and SPT c RW at supervision level. AMB deferred due to NGT suction still attached to wall, however attempts to take steps at bedside are deferred by urgency to void bowel. Pt able to sit on commode and maintain good balance without UE support. After several minutes, pt asks for additional time for BM development, session then ended and RN made aware of pt needs. Pt on BSC with Son nearby and call bell within reach. Functional mobility assessment demonstrates increased effort/time requirements, poor tolerance, but no need for physical assistance, whereas the patient performed these at a higher level of independence PTA. Two Sons and DIL provide IADL support, prefer to assist at DC as opposed to STR level care. Pt will benefit from skilled PT intervention to increase independence and safety with basic mobility in preparation for discharge to the venue listed below.       Follow Up Recommendations Home health PT;Supervision - Intermittent    Equipment Recommendations  None recommended by PT    Recommendations for Other Services        Precautions / Restrictions Precautions Precautions: Fall Restrictions Weight Bearing Restrictions: No      Mobility  Bed Mobility               General bed mobility comments: in recliner upon entry  Transfers Overall transfer level: Needs assistance Equipment used: Rolling walker (2 wheeled) Transfers: Sit to/from [employer] Transfers Sit to Stand: Supervision Stand pivot transfers: Supervision       General transfer comment: good safety awareness, moderate use of BUE, no gross imbalance  Ambulation/Gait Ambulation/Gait assistance: (deferred, pt needs time to make a BM in [facility]; encouraged to AMB with nursing if she feels able)              Stairs            Wheelchair Mobility    Modified Rankin (Stroke Patients Only)       Balance Overall balance assessment: Modified Independent;No apparent balance deficits (not formally assessed)                                           Pertinent Vitals/Pain Pain Assessment: 0-10 Pain Score: 5  Pain Location: central low back pain, T12 level across the entire back Pain Descriptors / Indicators: Aching;Sharp Pain Intervention(s): Limited activity within patient's tolerance;Premedicated before session    Home Living Family/patient expects to be discharged to:: Private residence Living Arrangements: Alone Available Help at Discharge: Family;Other (Comment)(one son in [place] the other in [place] help.) Type of Home: House Home Access: Stairs  to enter Entrance Stairs-Rails: Can reach both;Left;Right Entrance Stairs-Number of Steps: 2, likely couldnt reach both due to short arms Home Layout: One level Home Equipment: Cane - single point;Walker - 4 wheels Additional Comments: recliner sleeper due to back pain; bed has adjustable FOB and HOB;.    Prior Function Level of Independence: Needs assistance   Gait / Transfers Assistance Needed: rollator in house  ADL's / Homemaking Assistance  Needed: modI PTA, SPC in community forlimited community distances, rollator in house        Hand Dominance        Extremity/Trunk Assessment   Upper Extremity Assessment Upper Extremity Assessment: Generalized weakness;Overall [facility] for tasks assessed    Lower Extremity Assessment Lower Extremity Assessment: Generalized weakness;Overall [facility] for tasks assessed    Cervical / Trunk Assessment Cervical / Trunk Assessment: Kyphotic  Communication      Cognition Arousal/Alertness: Awake/alert Behavior During Therapy: WFL for tasks assessed/performed Overall Cognitive Status: Within Functional Limits for tasks assessed                                        General Comments      Exercises     Assessment/Plan    PT Assessment Patient needs continued PT services  PT Problem List Decreased strength;Decreased activity tolerance;Decreased mobility       PT Treatment Interventions DME instruction;Balance training;Gait training;Functional mobility training;Therapeutic activities;Therapeutic exercise;Patient/family education    PT Goals (Current goals can be found in the Care Plan section)  Acute Rehab PT Goals Patient Stated Goal: improve back pain regain strength PT Goal Formulation: With patient Time For Goal Achievement: 07/12/19 Potential to Achieve Goals: Good    Frequency 7X/week   Barriers to discharge        Co-evaluation               AM-PAC PT "6 Clicks" Mobility  Outcome Measure Help needed turning from your back to your side while in a flat bed without using bedrails?: A Little Help needed moving from lying on your back to sitting on the side of a flat bed without using bedrails?: A Little Help needed moving to and from a bed to a chair (including a wheelchair)?: A Little Help needed standing up from a chair using your arms (e.g., wheelchair or bedside chair)?: A Little Help needed to walk in hospital room?: A Little Help needed  climbing 3-5 steps with a railing? : A Lot 6 Click Score: 17    End of Session   Activity Tolerance: Patient tolerated treatment well;No increased pain Patient left: with family/visitor present;with call bell/phone within reach;Other (comment)(on BSC) Nurse Communication: Mobility status PT Visit Diagnosis: Difficulty in walking, not elsewhere classified (R26.2);Muscle weakness (generalized) (M62.81);Pain Pain - Right/Left: (central/bilat) Pain - part of body: (upper low back)    Time: [id number] PT Time Calculation (min) (ACUTE ONLY): 32 min   Charges:   PT Evaluation $PT Eval Moderate Complexity: 1 Mod PT Treatments $Therapeutic Exercise: 8-22 mins        3:52 PM, 06/28/19 Etta Grandchild, PT, DPT Physical Therapist - [facility]  [phone] ([place])    Kesean Serviss C 06/28/2019, 3:44 PM

## 2019-06-29 LAB — GLUCOSE, CAPILLARY: Glucose-Capillary: 73 mg/dL (ref 70–99)

## 2019-06-29 MED ORDER — ADULT MULTIVITAMIN W/MINERALS CH
1.0000 | ORAL_TABLET | Freq: Every day | ORAL | Status: DC
  Administered 2019-06-29: 1 via ORAL
  Filled 2019-06-29: qty 1

## 2019-06-29 MED ORDER — ONDANSETRON HCL 4 MG PO TABS: 4.0000 mg | ORAL_TABLET | Freq: Four times a day (QID) | ORAL | 0 refills | Status: DC | PRN

## 2019-06-29 MED ORDER — PRAVASTATIN SODIUM 20 MG PO TABS: 20.0000 mg | ORAL_TABLET | Freq: Every day | ORAL | Status: DC

## 2019-06-29 MED ORDER — MECLIZINE HCL 12.5 MG PO TABS
12.5000 mg | ORAL_TABLET | Freq: Once | ORAL | Status: AC
  Administered 2019-06-29: 15:00:00 12.5 mg via ORAL
  Filled 2019-06-29: qty 1

## 2019-06-29 MED ORDER — POLYETHYLENE GLYCOL 3350 17 G PO PACK: 17.0000 g | PACK | Freq: Two times a day (BID) | ORAL | 0 refills | Status: DC

## 2019-06-29 MED ORDER — DOXEPIN HCL 25 MG PO CAPS
25.0000 mg | ORAL_CAPSULE | Freq: Every day | ORAL | Status: DC
  Filled 2019-06-29: qty 1

## 2019-06-29 MED ORDER — PANTOPRAZOLE SODIUM 40 MG PO TBEC: 40.0000 mg | DELAYED_RELEASE_TABLET | Freq: Every day | ORAL | Status: DC

## 2019-06-29 MED ORDER — DOCUSATE SODIUM 100 MG PO CAPS: 100.0000 mg | ORAL_CAPSULE | Freq: Two times a day (BID) | ORAL | 0 refills | Status: DC

## 2019-06-29 NOTE — Discharge Summary (Addendum)
Amber Kirk NAME: Amber Kirk    MR#:  [id number]  DATE OF BIRTH:  12-Feb-1935  DATE OF ADMISSION:  06/27/2019 ADMITTING PHYSICIAN: Amber Ace, MD  DATE OF DISCHARGE: 06/29/2019  PRIMARY CARE PHYSICIAN: Amber Harrier, MD    ADMISSION DIAGNOSIS:  Colitis [K52.9] Ileus ([place]) [K56.7]  DISCHARGE DIAGNOSIS:  Colitis/Ileus suspected due to severe constipation-- now resolved T 12 inferior endplate vertebral compression fracture-- workup as outpatient with Amber Kirk SECONDARY DIAGNOSIS:   Past Medical History:  Diagnosis Date  . Allergy   . Asthma     HOSPITAL COURSE:   Amber Marcello Moores [place]is a 85 y.o.femalewith medical history significant ofchronic low back pain, rheumatoid arthritis on hydroxychloroquine, GERD, interstitial cystitis, hyperlipidemia, depression, chronic iron deficiency anemia who presents to the emergency department after having several day history of constipation and nausea and poor p.o. intake.  Acute colitis Ileus/constipation for 7 days Suspect mild acute colitis secondary to ileus from contipation--now resolved after aggressive Bowel regimen -no indications for antibiotic -patient tolerating soft diet -she is recommended take MiraLAX, Doc is a daily. Linzess as needed  T12 compression fracture Noted on CT Patient with history of back pain Pain control as needed Avoid narcotics discussed with Amber Kirk. Patient is able to ambulate with physical therapy. She will see Amber Kirk as outpatient. Son will make appointment on Monday  Rheumatoid arthritis Continue home Plaquenil  Asthma Not currently exacerbated Continue home nebulizer and inhaler regimen Supplemental oxygen as needed  GERD PPI  Interstitial cystitis Continue home [place]  Hyperlipidemia on statin  Chronic iron deficiency anemia Hemoglobin at baseline  Obesity stage III, BMI 40.4  Generalized  weakness with back pain. Physical therapy to see patient-- recommends home health PT  Rolling walker prescribed   DVT prophylaxis:Lovenox Code Status:Full Family Communication:pt  Disposition Plan:return back to previous home environment Consults called: Admission status:inpatient, med surg  Status is: Inpatient   Dispo: The patient is from: Home  Anticipated d/c is to: home  Anticipated d/c date is: 424 2021  Patient currently  stable for discharge  CONSULTS OBTAINED:  Treatment Team:  Amber Husbands, MD Amber Knows, MD  DRUG ALLERGIES:   Allergies  Allergen Reactions  . Penicillins Hives, Rash and Other (See Comments)    And itching And itching  . Morphine And Related Nausea And Vomiting and Nausea Only  . Chlorzoxazone Other (See Comments)  . Ciprofloxacin Other (See Comments)  . Clarithromycin Other (See Comments)  . Clindamycin Hcl Other (See Comments)  . Codeine Sulfate Other (See Comments)  . Conjugated Estrogens Other (See Comments)  . Hydrocodone-Homatropine Other (See Comments)  . Hydromorphone Other (See Comments)  . Lidocaine Other (See Comments)  . Meperidine Other (See Comments)  . Propoxyphene Other (See Comments)  . Sulfa Antibiotics Other (See Comments)  . Tioconazole Other (See Comments)    DISCHARGE MEDICATIONS:   Allergies as of 06/29/2019      Reactions   Penicillins Hives, Rash, Other (See Comments)   And itching And itching   Morphine And Related Nausea And Vomiting, Nausea Only   Chlorzoxazone Other (See Comments)   Ciprofloxacin Other (See Comments)   Clarithromycin Other (See Comments)   Clindamycin Hcl Other (See Comments)   Codeine Sulfate Other (See Comments)   Conjugated Estrogens Other (See Comments)   Hydrocodone-homatropine Other (See Comments)   Hydromorphone Other (See Comments)   Lidocaine Other (See Comments)   Meperidine Other (See Comments)  Propoxyphene Other  (See Comments)   Sulfa Antibiotics Other (See Comments)   Tioconazole Other (See Comments)      Medication List    TAKE these medications   aspirin EC 81 MG tablet Take 81 mg by mouth.   docusate sodium 100 MG capsule Commonly known as: COLACE Take 1 capsule (100 mg total) by mouth 2 (two) times daily.   doxepin 25 MG capsule Commonly known as: SINEQUAN Take 25 mg by mouth at bedtime.   fluticasone 50 MCG/ACT nasal spray Commonly known as: FLONASE Place 2 sprays into both nostrils daily.   hydroxychloroquine 200 MG tablet Commonly known as: PLAQUENIL Take 100-200 mg by mouth every other day.   Linzess 145 MCG Caps capsule Generic drug: linaclotide Take 145 mcg by mouth daily. Notes to patient: Take as needed if you don't have BM for >2 days   lovastatin 20 MG tablet Commonly known as: MEVACOR Take 20 mg by mouth daily with supper.   montelukast 10 MG tablet Commonly known as: SINGULAIR Take 10 mg by mouth daily.   Multi-Vitamins Tabs Take 1 tablet by mouth daily.   omeprazole 20 MG capsule Commonly known as: PRILOSEC Take 20 mg by mouth in the morning and at bedtime.   ondansetron 4 MG tablet Commonly known as: ZOFRAN Take 1 tablet (4 mg total) by mouth every 6 (six) hours as needed for nausea.   polyethylene glycol 17 g packet Commonly known as: MIRALAX / GLYCOLAX Take 17 g by mouth 2 (two) times daily.   Symbicort 160-4.5 MCG/ACT inhaler Generic drug: budesonide-formoterol Inhale 2 puffs into the lungs in the morning and at bedtime.   trospium 20 MG tablet Commonly known as: SANCTURA Take 20 mg by mouth at bedtime.            Durable Medical Equipment  (From admission, onward)         Start     Ordered   06/29/19 1338  For home use only DME Walker rolling  Once    Question Answer Comment  Walker: With 5 Inch Wheels   Patient needs a walker to treat with the following condition General weakness      06/29/19 1338          If you  experience worsening of your admission symptoms, develop shortness of breath, life threatening emergency, suicidal or homicidal thoughts you must seek medical attention immediately by calling 911 or calling your MD immediately  if symptoms less severe.  You Must read complete instructions/literature along with all the possible adverse reactions/side effects for all the Medicines you take and that have been prescribed to you. Take any new Medicines after you have completely understood and accept all the possible adverse reactions/side effects.   Please note  You were cared for by a hospitalist during your hospital stay. If you have any questions about your discharge medications or the care you received while you were in the hospital after you are discharged, you can call the unit and asked to speak with the hospitalist on call if the hospitalist that took care of you is not available. Once you are discharged, your primary care physician will handle any further medical issues. Please note that NO REFILLS for any discharge medications will be authorized once you are discharged, as it is imperative that you return to your primary care physician (or establish a relationship with a primary care physician if you do not have one) for your aftercare needs so that they  can reassess your need for medications and monitor your lab values. Today   SUBJECTIVE   Patient feels a lot better after a large bowel movement yesterday. She is tolerating PO soft diet. Son at bedside. Ambulated with physical therapy. Mild back pain.  VITAL SIGNS:  Blood pressure (!) 113/42, pulse 83, temperature 98.5 F (36.9 C), temperature source Oral, resp. rate 16, height 4\' 11"  (1.499 m), weight 90.7 kg, SpO2 97 %.  I/O:    Intake/Output Summary (Last 24 hours) at 06/29/2019 1459 Last data filed at 06/29/2019 1345 Gross per 24 hour  Intake 1300.4 ml  Output 40 ml  Net 1260.4 ml    PHYSICAL EXAMINATION:  GENERAL:  85  y.o.-year-old patient lying in the bed with no acute distress. Obesity EYES: Pupils equal, round, reactive to light and accommodation. No scleral icterus.  HEENT: Head atraumatic, normocephalic. Oropharynx and nasopharynx clear.  NECK:  Supple, no jugular venous distention. No thyroid enlargement, no tenderness.  LUNGS: Normal breath sounds bilaterally, no wheezing, rales,rhonchi or crepitation. No use of accessory muscles of respiration.  CARDIOVASCULAR: S1, S2 normal. No murmurs, rubs, or gallops.  ABDOMEN: Soft, non-tender, non-distended. Bowel sounds present. No organomegaly or mass.  EXTREMITIES: No pedal edema, cyanosis, or clubbing.  NEUROLOGIC: Cranial nerves II through XII are intact. Muscle strength 5/5 in all extremities. Sensation intact. Gait not checked.  PSYCHIATRIC: The patient is alert and oriented x 3.  SKIN: No obvious rash, lesion, or ulcer.   DATA REVIEW:   CBC  Recent Labs  Lab 06/28/19 0446  WBC 13.6*  HGB 12.8  HCT 39.9  PLT 302    Chemistries  Recent Labs  Lab 06/27/19 1402 06/27/19 1402 06/28/19 0446  NA 131*   < > 135  K 4.2   < > 4.5  CL 85*   < > 88*  CO2 34*   < > 35*  GLUCOSE 137*   < > 128*  BUN 30*   < > 33*  CREATININE 1.02*   < > 1.08*  CALCIUM 9.4   < > 8.9  AST 26  --   --   ALT 21  --   --   ALKPHOS 81  --   --   BILITOT 0.9  --   --    < > = values in this interval not displayed.    Microbiology Results   Recent Results (from the past 240 hour(s))  Respiratory Panel by RT PCR (Flu A&B, Covid) - Nasopharyngeal Swab     Status: None   Collection Time: 06/27/19  4:29 PM   Specimen: Nasopharyngeal Swab  Result Value Ref Range Status   SARS Coronavirus 2 by RT PCR NEGATIVE NEGATIVE Final    Comment: (NOTE) SARS-CoV-2 target nucleic acids are NOT DETECTED. The SARS-CoV-2 RNA is generally detectable in upper respiratoy specimens during the acute phase of infection. The lowest concentration of SARS-CoV-2 viral copies this assay  can detect is 131 copies/mL. A negative result does not preclude SARS-Cov-2 infection and should not be used as the sole basis for treatment or other patient management decisions. A negative result may occur with  improper specimen collection/handling, submission of specimen other than nasopharyngeal swab, presence of viral mutation(s) within the areas targeted by this assay, and inadequate number of viral copies (<131 copies/mL). A negative result must be combined with clinical observations, patient history, and epidemiological information. The expected result is Negative. Fact Sheet for Patients:  PinkCheek.be Fact Sheet for Healthcare Providers:  GravelBags.it This test is not yet ap proved or cleared by the [place] and  has been authorized for detection and/or diagnosis of SARS-CoV-2 by FDA under an Emergency Use Authorization (EUA). This EUA will remain  in effect (meaning this test can be used) for the duration of the COVID-19 declaration under Section 564(b)(1) of the Act, 21 U.S.C. section 360bbb-3(b)(1), unless the authorization is terminated or revoked sooner.    Influenza A by PCR NEGATIVE NEGATIVE Final   Influenza B by PCR NEGATIVE NEGATIVE Final    Comment: (NOTE) The Xpert Xpress SARS-CoV-2/FLU/RSV assay is intended as an aid in  the diagnosis of influenza from Nasopharyngeal swab specimens and  should not be used as a sole basis for treatment. Nasal washings and  aspirates are unacceptable for Xpert Xpress SARS-CoV-2/FLU/RSV  testing. Fact Sheet for Patients: PinkCheek.be Fact Sheet for Healthcare Providers: GravelBags.it This test is not yet approved or cleared by the [place] FDA and  has been authorized for detection and/or diagnosis of SARS-CoV-2 by  FDA under an Emergency Use Authorization (EUA). This EUA will remain  in effect  (meaning this test can be used) for the duration of the  Covid-19 declaration under Section 564(b)(1) of the Act, 21  U.S.C. section 360bbb-3(b)(1), unless the authorization is  terminated or revoked. Performed at [facility], [address] [place], [place] [address]     RADIOLOGY:  DG Abdomen 1 View  Result Date: 06/27/2019 CLINICAL DATA:  Nasogastric tube placement. EXAM: ABDOMEN - 1 VIEW COMPARISON:  CT, 06/26/2019 FINDINGS: Nasogastric tube passes well below the diaphragm, tip projecting in the distal stomach. IMPRESSION: Well-positioned nasogastric tube. Electronically Signed   By: Lajean Manes M.D.   On: 06/27/2019 18:12     CODE STATUS:     Code Status Orders  (From admission, onward)         Start     Ordered   06/27/19 1628  Full code  Continuous     06/27/19 1631        Code Status History    This patient has a current code status but no historical code status.   [facility] Care Planning Activity       TOTAL TIME TAKING CARE OF THIS PATIENT: *40* minutes.    Fritzi Mandes M.D  [facility]    CC: Primary care physician; Amber Harrier, MD

## 2019-06-29 NOTE — TOC Transition Note (Addendum)
Transition of Care [facility]) - CM/SW Discharge Note   Patient Details  Name: Amber Kirk MRN: [id number] Date of Birth: 1934/10/23  [facility]) CM/SW Contact:  Marshell Garfinkel, RN Phone Number: 06/29/2019, 2:07 PM   Clinical Narrative:     Unable to get home health agency to contract with insurance. [facility] unable to go to [place]. Patient has recently gone to outpatient with [facility]; Rx requested from MD and will provide to patient. Rolling walker to be delivered to home address. Patient has rollator. Son/patient all agree.  Rx for outpatient delivered to son per Dr. Posey Pronto. Home health agencies declined: [facility], [facility], [facility] (has not responded), [facility], [facility], [facility] (has not responded), [facility] (has not responded).        Patient Goals and CMS Choice   CMS Medicare.gov Compare Post Acute Care list provided to:: Patient Choice offered to / list presented to : Patient, Adult Children  Discharge Placement                       Discharge Plan and Services                DME Arranged: Walker rolling DME Agency: [facility] Date DME Agency Contacted: 06/29/19 Time DME Agency Contacted: 57 Representative spoke with at DME Agency: [place] ([place]) Interventions     Readmission Risk Interventions No flowsheet data found.

## 2019-06-29 NOTE — Progress Notes (Signed)
Physical Therapy Treatment Patient Details Name: Amber Kirk MRN: [id number] DOB: 07/22/1934 Today's Date: 06/29/2019    History of Present Illness Syrianna Zimmel is an 60yoF who comes to [facility] 4/[facility] c ABD pain. Outpatient CT scan demonstrating evidence of colitis with associated ileus. Also notable on CT scan was a compression fracture T12 vertebrae. PMH: low back pain, RA, GERD, HLD, depression, chronic iron deficiency anemia.    PT Comments    Pt presented in recliner with family member present. She stated that she is having some back pain but really wants to get up and moving. Back pain reported at 4/10 on faces scale. Pt was educated on spinal precautions prior to transferring. Pt performed STS modI to RW, cues to utilize chair armrests and not RW handles for stability during transfer. Pt ambulated 120 ft with RW and supervision (gait belt donned under arms), demonstrated less stance time and step length with left LE, though no LOB or increase in pain was appreciated. Mod VC required to maintain upright posture and stay within confines of RW during ambulation to abide by spinal precautions. Pt navigated 2 steps with BUE assist on railing at right side, forward on the way up and sideways on the way down, required min VC to avoid twisting and bending. Stated that she was confident in her ability to navigate her home with stairs and ambulation independently with a RW. PTA recommends pt receive RW with 5" wheels for stability during household and community mobility to reduce future falls risk. Pt progressing toward her goals, current plan of care remains appropriate, recommend HHPT follow to assess gait safety and independence with ADLs to return to PLOF.    Follow Up Recommendations  Home health PT;Supervision - Intermittent     Equipment Recommendations  Rolling walker with 5" wheels    Recommendations for Other Services       Precautions / Restrictions  Precautions Precautions: Fall;Back Precaution Comments: Educated on no bending, twisting or lifting >10 lbs.  Restrictions Weight Bearing Restrictions: No    Mobility  Bed Mobility               General bed mobility comments: in recliner upon entry  Transfers Overall transfer level: Modified independent(verbal cues to utilize chair armrests for transfer) Equipment used: Rolling walker (2 wheeled) Transfers: Sit to/from Stand Sit to Stand: Supervision Stand pivot transfers: Supervision       General transfer comment: Good safety awareness, cues to limit bending during STS  Ambulation/Gait Ambulation/Gait assistance: Supervision Gait Distance (Feet): 120 Feet Assistive device: Rolling walker (2 wheeled) Gait Pattern/deviations: Step-through pattern;Decreased stance time - left;Decreased step length - left     General Gait Details: Not limited by endurance, multiple cues to maintain pright posture, stay within confines of RW, and to turn without twisting upper body.    Stairs Stairs: Yes Stairs assistance: Min guard Stair Management: One rail Right;Forwards;Sideways Number of Stairs: 2 General stair comments: forwards going up, sideways going down, BUE assist on rail. Pt feels confident she could perform task independently at home.    Wheelchair Mobility    Modified Rankin (Stroke Patients Only)       Balance                                            Cognition Arousal/Alertness: Awake/alert Behavior During Therapy: WFL for tasks assessed/performed Overall  Cognitive Status: Within Functional Limits for tasks assessed                                 General Comments: Pt A&Ox4, motivated for therapy, stated she is independent with medication management,       Exercises      General Comments        Pertinent Vitals/Pain Pain Assessment: Faces Pain Score: 4  Faces Pain Scale: Hurts little more Pain Location: central  low back pain, T12 level across the entire back Pain Descriptors / Indicators: Aching;Sharp Pain Intervention(s): Limited activity within patient's tolerance;Monitored during session    Home Living                      Prior Function            PT Goals (current goals can now be found in the care plan section) Acute Rehab PT Goals Patient Stated Goal: improve back pain regain strength PT Goal Formulation: With patient Progress towards PT goals: Progressing toward goals    Frequency    7X/week      PT Plan Current plan remains appropriate;Equipment recommendations need to be updated    Co-evaluation              AM-PAC PT "6 Clicks" Mobility   Outcome Measure  Help needed turning from your back to your side while in a flat bed without using bedrails?: A Little Help needed moving from lying on your back to sitting on the side of a flat bed without using bedrails?: A Little Help needed moving to and from a bed to a chair (including a wheelchair)?: A Little Help needed standing up from a chair using your arms (e.g., wheelchair or bedside chair)?: A Little Help needed to walk in hospital room?: A Little Help needed climbing 3-5 steps with a railing? : A Little 6 Click Score: 18    End of Session Equipment Utilized During Treatment: Gait belt Activity Tolerance: Patient tolerated treatment well;No increased pain Patient left: in chair;with call bell/phone within reach;with family/visitor present Nurse Communication: Mobility status PT Visit Diagnosis: Difficulty in walking, not elsewhere classified (R26.2);Muscle weakness (generalized) (M62.81);Pain     Time: [id number] PT Time Calculation (min) (ACUTE ONLY): 29 min  Charges:  $Gait Training: 8-22 mins $Therapeutic Activity: 8-22 mins                     Julaine Fusi PTA 06/29/19, 12:36 PM

## 2019-06-29 NOTE — Plan of Care (Signed)
Discharge teaching completed with patient who verbalized understanding of teaching, medications, and follow-up appointments.  Discharged in stable condition with belongings.

## 2019-06-29 NOTE — Plan of Care (Signed)
Continuing with plan of care. 

## 2019-07-01 DIAGNOSIS — S22000A Wedge compression fracture of unspecified thoracic vertebra, initial encounter for closed fracture: Secondary | ICD-10-CM | POA: Diagnosis not present

## 2019-07-02 ENCOUNTER — Other Ambulatory Visit: Payer: Self-pay | Admitting: Orthopedic Surgery

## 2019-07-03 ENCOUNTER — Encounter
Admission: RE | Admit: 2019-07-03 | Discharge: 2019-07-03 | Disposition: A | Discharge status: Home / Self Care | Payer: PPO | Source: Ambulatory Visit | Attending: Orthopedic Surgery | Admitting: Orthopedic Surgery

## 2019-07-03 ENCOUNTER — Other Ambulatory Visit
Admission: RE | Admit: 2019-07-03 | Discharge: 2019-07-03 | Disposition: A | Discharge status: Home / Self Care | Payer: PPO | Source: Ambulatory Visit | Attending: Orthopedic Surgery | Admitting: Orthopedic Surgery

## 2019-07-03 ENCOUNTER — Other Ambulatory Visit: Payer: Self-pay

## 2019-07-03 DIAGNOSIS — Z01812 Encounter for preprocedural laboratory examination: Secondary | ICD-10-CM | POA: Diagnosis not present

## 2019-07-03 DIAGNOSIS — Z20822 Contact with and (suspected) exposure to covid-19: Secondary | ICD-10-CM | POA: Insufficient documentation

## 2019-07-03 HISTORY — DX: Nausea with vomiting, unspecified: R11.2

## 2019-07-03 HISTORY — DX: Gastro-esophageal reflux disease without esophagitis: K21.9

## 2019-07-03 HISTORY — DX: Sleep apnea, unspecified: G47.30

## 2019-07-03 HISTORY — DX: Cardiac murmur, unspecified: R01.1

## 2019-07-03 HISTORY — DX: Chronic obstructive pulmonary disease, unspecified: J44.9

## 2019-07-03 HISTORY — DX: Other complications of anesthesia, initial encounter: T88.59XA

## 2019-07-03 HISTORY — DX: Other specified postprocedural states: Z98.890

## 2019-07-03 LAB — SARS CORONAVIRUS 2 (TAT 6-24 HRS): SARS Coronavirus 2: NEGATIVE

## 2019-07-03 NOTE — Pre-Procedure Instructions (Signed)
Echo complete12/31/2020 [place] Component Name Value Ref Range  LV Ejection Fraction (%) 55   Aortic Valve Stenosis Grade mild   Aortic Valve Regurgitation Grade trivial   Aortic Valve Max Velocity (m/s) 2.8 m/sec  Aortic Valve Stenosis Mean Gradient (mmHg) 10.0 mmHg  Mitral Valve Stenosis Grade none   Mitral Valve Regurgitation Grade trivial   Tricuspid Valve Regurgitation Grade mild   Tricuspid Valve Regurgitation Max Velocity (m/s) 2.8 m/sec  Right Ventricle Systolic Pressure (mmHg) [id number] mmHg  LV End Diastolic Diameter (cm) 4.5 cm  LV End Systolic Diameter (cm) 3.2 cm  LV Septum Wall Thickness (cm) 1.1 cm  LV Posterior Wall Thickness (cm) 0.87 cm  Left Atrium Diameter (cm) 4.5 cm  Result Narrative         INTERNAL MEDICINE DEPARTMENT          Amber Kirk #: [id number]      [name], Amber Kirk, [place] [address]   Date: 03/06/2019 02: 71 PM                                Adult  Female  Age: 86 yrs      ECHOCARDIOGRAM REPORT               Outpatient                                [name]    STUDY:CHEST WALL        TAPE:0000: 00: 0: 00: 00 MD1: Amber Kirk                                HANDATTUR    ECHO:Yes  DOPPLER:Yes    FILE:0000-000-000    BP: 137/66 mmHg    COLOR:Yes  CONTRAST:No   MACHINE:Philips  RV BIOPSY:No     3D:No SOUND QLTY:Moderate      Height: 58 in   MEDIUM:None                       Weight: 202 lb                                BSA: 1.8 m2 _________________________________________________________________________________________        HISTORY: DOE          REASON: Assess, LV function       INDICATION: Aortic systolic murmur on examination [I35.8 (ICD-10-CM)] _________________________________________________________________________________________ ECHOCARDIOGRAPHIC MEASUREMENTS 2D DIMENSIONS AORTA         Values  Normal Range  MAIN PA     Values  Normal Range        Annulus: nm*     [2.1-2.5]     PA Main: nm*    [1.5-2.1]       Aorta Sin: nm*     [2.7-3.3]  RIGHT VENTRICLE      ST Junction: nm*     [2.3-2.9]     RV Base: 4.9 cm  [<  4.2]       Asc.Aorta: nm*     [2.3-3.1]     RV Mid: nm*    [<3.5] LEFT VENTRICLE                   RV Length: nm*    [<8.6]         LVIDd: 4.5 cm    [3.9-5.3]  INFERIOR VENA CAVA         LVIDs: 3.2 cm            Max. IVC: nm*    [<=2.1]           FS: 28.0 %    [>25]      Min. IVC: nm*          SWT: 1.1 cm    [0.5-0.9]  ------------------          PWT: 0.87 cm   [0.5-0.9]  nm* - not measured LEFT ATRIUM        LA Diam: 4.5 cm    [2.7-3.8]      LA A4C Area: nm*     [<20]       LA Volume: nm*     [22-52] _________________________________________________________________________________________ ECHOCARDIOGRAPHIC DESCRIPTIONS AORTIC ROOT          Size: Normal       Dissection: INDETERM FOR DISSECTION AORTIC VALVE        Leaflets: Tricuspid          Morphology: MILDLY THICKENED        Mobility: Fully mobile LEFT VENTRICLE          Size: Normal            Anterior: Normal      Contraction: Normal             Lateral: Normal       Closest EF: >55% (Estimated)        Septal: Normal       LV Masses: No Masses            Apical: Normal          LVH: None              Inferior: Normal                           Posterior: Normal      Dias.FxClass: N/A MITRAL VALVE        Leaflets: Normal            Mobility: Fully mobile       Morphology: THICKENED LEAFLET(S) LEFT ATRIUM          Size: MILDLY ENLARGED       LA Masses: No masses       IA Septum: Normal IAS MAIN PA          Size: Normal PULMONIC VALVE       Morphology: Normal            Mobility: Fully mobile RIGHT VENTRICLE       RV Masses: No Masses             Size: Normal       Free Wall: Normal           Contraction: Normal TRICUSPID VALVE        Leaflets: Normal            Mobility: Fully mobile  Morphology: Normal RIGHT ATRIUM          Size: Normal            RA Other: None        RA Mass: No masses PERICARDIUM         Fluid: TRIVIAL EFFUSION INFERIOR VENACAVA          Size: Normal Normal respiratory collapse _________________________________________________________________________________________  DOPPLER ECHO and OTHER SPECIAL PROCEDURES         Aortic: TRIVIAL AR         MILD AS             283.2 cm/sec peak vel   32.1 mmHg peak grad             10.0 mmHg mean grad    0.95 cm^2 by DOPPLER         Mitral: TRIVIAL MR         No MS             MV Inflow E Vel = 118.0 cm/sec   MV Annulus E'Vel = 6.0 cm/sec             E/E'Ratio = 19.7       Tricuspid: MILD TR          No TS             278.3 cm/sec peak TR vel  34.0 mmHg peak RV pressure       Pulmonary: MILD PR          No PS _________________________________________________________________________________________ INTERPRETATION NORMAL LEFT VENTRICULAR SYSTOLIC FUNCTION NORMAL RIGHT VENTRICULAR SYSTOLIC  FUNCTION MILD VALVULAR REGURGITATION (See above) MILD VALVULAR STENOSIS (See above) _________________________________________________________________________________________ Electronically signed by   Amber Aus, MD on 03/07/2019 02: 10 PM      Performed By: Amber Kirk, RCS   Ordering Physician: Amber Kirk _________________________________________________________________________________________  Other Result Information  Interface, Text Results In - 03/07/2019  2:11 PM EST               [name], [place]                                    [id number]           [place] #: [id number]           [address], [name],   [address]      Date: 03/06/2019 02: 13 PM                                                              Adult   Female   Age: 46 yrs           ECHOCARDIOGRAM REPORT  Outpatient                                                              [name]      STUDY:CHEST WALL               TAPE:0000: 00: 0: 00: 00 MD1: Amber Kirk                                                               HANDATTUR       ECHO:Yes    DOPPLER:Yes       FILE:0000-000-000        BP: 137/66 mmHg      COLOR:Yes   CONTRAST:No     MACHINE:Philips  RV BIOPSY:No          3D:No  SOUND QLTY:Moderate            Height: 58 in     MEDIUM:None                                              Weight: 202 lb                                                              BSA: 1.8 m2 _________________________________________________________________________________________               HISTORY: DOE                REASON: Assess, LV function            INDICATION: Aortic systolic murmur on examination [I35.8 (ICD-10-CM)] _________________________________________________________________________________________ ECHOCARDIOGRAPHIC  MEASUREMENTS 2D DIMENSIONS AORTA                  Values   Normal Range   MAIN PA         Values    Normal Range               Annulus: nm*          [2.1-2.5]         PA Main: nm*       [1.5-2.1]             Aorta Sin: nm*          [2.7-3.3]    RIGHT VENTRICLE           ST Junction: nm*          [2.3-2.9]         RV Base: 4.9 cm    [< 4.2]             Asc.Aorta: nm*          [2.3-3.1]          RV Mid: nm*       [<  3.5] LEFT VENTRICLE                                      RV Length: nm*       [<8.6]                 LVIDd: 4.5 cm       [3.9-5.3]    INFERIOR VENA CAVA                 LVIDs: 3.2 cm                        Max. IVC: nm*       [<=2.1]                    FS: 28.0 %       [>25]            Min. IVC: nm*                   SWT: 1.1 cm       [0.5-0.9]    ------------------                   PWT: 0.87 cm      [0.5-0.9]    nm* - not measured LEFT ATRIUM               LA Diam: 4.5 cm       [2.7-3.8]           LA A4C Area: nm*          [<20]             LA Volume: nm*          [22-52] _________________________________________________________________________________________ ECHOCARDIOGRAPHIC DESCRIPTIONS AORTIC ROOT                  Size: Normal            Dissection: INDETERM FOR DISSECTION AORTIC VALVE              Leaflets: Tricuspid                   Morphology: MILDLY THICKENED              Mobility: Fully mobile LEFT VENTRICLE                  Size: Normal                        Anterior: Normal           Contraction: Normal                         Lateral: Normal            Closest EF: >55% (Estimated)                Septal: Normal             LV Masses: No Masses                       Apical: Normal                   LVH: None  Inferior: Normal                                                     Posterior: Normal          Dias.FxClass: N/A MITRAL VALVE              Leaflets: Normal                        Mobility: Fully mobile            Morphology:  THICKENED LEAFLET(S) LEFT ATRIUM                  Size: MILDLY ENLARGED              LA Masses: No masses             IA Septum: Normal IAS MAIN PA                  Size: Normal PULMONIC VALVE            Morphology: Normal                        Mobility: Fully mobile RIGHT VENTRICLE             RV Masses: No Masses                         Size: Normal             Free Wall: Normal                     Contraction: Normal TRICUSPID VALVE              Leaflets: Normal                        Mobility: Fully mobile            Morphology: Normal RIGHT ATRIUM                  Size: Normal                        RA Other: None               RA Mass: No masses PERICARDIUM                 Fluid: TRIVIAL EFFUSION INFERIOR VENACAVA                  Size: Normal Normal respiratory collapse _________________________________________________________________________________________  DOPPLER ECHO and OTHER SPECIAL PROCEDURES                Aortic: TRIVIAL AR                 MILD AS                        283.2 cm/sec peak vel      32.1 mmHg peak grad                        10.0 mmHg mean grad  0.95 cm^2 by DOPPLER                Mitral: TRIVIAL MR                 No MS                        MV Inflow E Vel = 118.0 cm/sec      MV Annulus E'Vel = 6.0 cm/sec                        E/E'Ratio = 19.7             Tricuspid: MILD TR                    No TS                        278.3 cm/sec peak TR vel   34.0 mmHg peak RV pressure             Pulmonary: MILD PR                    No PS _________________________________________________________________________________________ INTERPRETATION NORMAL LEFT VENTRICULAR SYSTOLIC FUNCTION NORMAL RIGHT VENTRICULAR SYSTOLIC FUNCTION MILD VALVULAR REGURGITATION (See above) MILD VALVULAR STENOSIS (See above) _________________________________________________________________________________________ Electronically signed by      Amber Aus, MD on  03/07/2019 02: 10 PM          Performed By: Amber Kirk, RCS    Ordering Physician: Amber Kirk _________________________________________________________________________________________  Status Results Details   Encounter Summary

## 2019-07-03 NOTE — Patient Instructions (Addendum)
Your procedure is scheduled on: 07-05-19 FRIDAY Report to Same Day Surgery 2nd floor medical mall [facility] Entrance-take elevator on left to 2nd floor.  Check in with surgery information desk.) To find out your arrival time please call [phone] between 1PM - 3PM on 07-04-19 THURSDAY  Remember: Instructions that are not followed completely may result in serious medical risk, up to and including death, or upon the discretion of your surgeon and anesthesiologist your surgery may need to be rescheduled.    _x___ 1. Do not eat food after midnight the night before your procedure. NO GUM OR CANDY AFTER MIDNIGHT. You may drink clear liquids up to 2 hours before you are scheduled to arrive at the hospital for your procedure.  Do not drink clear liquids within 2 hours of your scheduled arrival to the hospital.  Clear liquids include  --Water or Apple juice without pulp  --Gatorade  --Black Coffee or Clear Tea (No milk, no creamers, do not add anything to the coffee or Tea-sugar ok to add)   ____Ensure clear carbohydrate drink on the way to the hospital for bariatric patients  ____Ensure clear carbohydrate drink 3 hours before surgery.    __x__ 2. No Alcohol for 24 hours before or after surgery.   __x__3. No Smoking or e-cigarettes for 24 prior to surgery.  Do not use any chewable tobacco products for at least 6 hour prior to surgery   ____  4. Bring all medications with you on the day of surgery if instructed.    __x__ 5. Notify your doctor if there is any change in your medical condition     (cold, fever, infections).    x___6. On the morning of surgery brush your teeth with toothpaste and water.  You may rinse your mouth with mouth wash if you wish.  Do not swallow any toothpaste or mouthwash.   Do not wear jewelry, make-up, hairpins, clips or nail polish.  Do not wear lotions, powders, or perfumes. You may wear deodorant.  Do not shave 48 hours prior to surgery. Men may shave face  and neck.  Do not bring valuables to the hospital.    [facility] is not responsible for any belongings or valuables.               Contacts, dentures or bridgework may not be worn into surgery.  Leave your suitcase in the car. After surgery it may be brought to your room.  For patients admitted to the hospital, discharge time is determined by your  treatment team.  _  Patients discharged the day of surgery will not be allowed to drive home.  You will need someone to drive you home and stay with you the night of your procedure.    Please read over the following fact sheets that you were given:   [facility] Preparing for Surgery   _x___ TAKE THE FOLLOWING MEDICATION THE MORNING OF SURGERY WITH A SMALL SIP OF WATER. These include:  1. ZYRTEC (CETIRIZINE)  2. PRILOSEC (OMEPRAZOLE)  3.  4.  5.  6.  ____Fleets enema or Magnesium Citrate as directed.   ____ Use CHG Soap or sage wipes as directed on instruction sheet   _X___ Use inhalers on the day of surgery and bring to hospital day of surgery-USE YOUR SYMBICORT AND ALBUTEROL INHALER AM OF SURGERY AND BRING ALBUTEROL INHALER TO HOSPITAL  ____ Stop Metformin and Janumet 2 days prior to surgery.    ____ Take 1/2 of usual  insulin dose the night before surgery and none on the morning surgery.   _x___ Follow recommendations from Cardiologist, Pulmonologist or PCP regarding stopping Aspirin, Coumadin, Plavix ,Eliquis, Effient, or Pradaxa, and Pletal-STOP ASPIRIN NOW  X____Stop Anti-inflammatories such as Advil, Aleve, Ibuprofen, Motrin, Naproxen, Naprosyn, Goodies powders or aspirin products NOW-OK to take Tylenol    ____ Stop supplements until after surgery.     ____ Bring C-Pap to the hospital.

## 2019-07-05 ENCOUNTER — Ambulatory Visit: Payer: PPO | Admitting: Family

## 2019-07-05 ENCOUNTER — Ambulatory Visit
Admission: RE | Admit: 2019-07-05 | Discharge: 2019-07-05 | Disposition: A | Discharge status: Home / Self Care | Payer: PPO | Attending: Orthopedic Surgery | Admitting: Orthopedic Surgery

## 2019-07-05 ENCOUNTER — Encounter
Admission: RE | Disposition: A | Discharge status: Home / Self Care | Payer: Self-pay | Source: Home / Self Care | Attending: Orthopedic Surgery

## 2019-07-05 ENCOUNTER — Other Ambulatory Visit: Payer: Self-pay | Admitting: Orthopedic Surgery

## 2019-07-05 ENCOUNTER — Other Ambulatory Visit: Payer: Self-pay

## 2019-07-05 ENCOUNTER — Encounter: Payer: Self-pay | Admitting: Orthopedic Surgery

## 2019-07-05 ENCOUNTER — Ambulatory Visit: Payer: PPO

## 2019-07-05 DIAGNOSIS — Z885 Allergy status to narcotic agent status: Secondary | ICD-10-CM | POA: Diagnosis not present

## 2019-07-05 DIAGNOSIS — Z6841 Body Mass Index (BMI) 40.0 and over, adult: Secondary | ICD-10-CM | POA: Insufficient documentation

## 2019-07-05 DIAGNOSIS — Z881 Allergy status to other antibiotic agents status: Secondary | ICD-10-CM | POA: Insufficient documentation

## 2019-07-05 DIAGNOSIS — J449 Chronic obstructive pulmonary disease, unspecified: Secondary | ICD-10-CM | POA: Insufficient documentation

## 2019-07-05 DIAGNOSIS — Z79899 Other long term (current) drug therapy: Secondary | ICD-10-CM | POA: Insufficient documentation

## 2019-07-05 DIAGNOSIS — K219 Gastro-esophageal reflux disease without esophagitis: Secondary | ICD-10-CM | POA: Insufficient documentation

## 2019-07-05 DIAGNOSIS — Z7951 Long term (current) use of inhaled steroids: Secondary | ICD-10-CM | POA: Diagnosis not present

## 2019-07-05 DIAGNOSIS — E669 Obesity, unspecified: Secondary | ICD-10-CM | POA: Diagnosis not present

## 2019-07-05 DIAGNOSIS — Z882 Allergy status to sulfonamides status: Secondary | ICD-10-CM | POA: Insufficient documentation

## 2019-07-05 DIAGNOSIS — Z7982 Long term (current) use of aspirin: Secondary | ICD-10-CM | POA: Insufficient documentation

## 2019-07-05 DIAGNOSIS — M4854XA Collapsed vertebra, not elsewhere classified, thoracic region, initial encounter for fracture: Secondary | ICD-10-CM | POA: Insufficient documentation

## 2019-07-05 DIAGNOSIS — G4730 Sleep apnea, unspecified: Secondary | ICD-10-CM | POA: Diagnosis not present

## 2019-07-05 DIAGNOSIS — M170 Bilateral primary osteoarthritis of knee: Secondary | ICD-10-CM | POA: Insufficient documentation

## 2019-07-05 DIAGNOSIS — Z888 Allergy status to other drugs, medicaments and biological substances status: Secondary | ICD-10-CM | POA: Diagnosis not present

## 2019-07-05 DIAGNOSIS — S22080A Wedge compression fracture of T11-T12 vertebra, initial encounter for closed fracture: Secondary | ICD-10-CM | POA: Diagnosis not present

## 2019-07-05 DIAGNOSIS — E785 Hyperlipidemia, unspecified: Secondary | ICD-10-CM | POA: Insufficient documentation

## 2019-07-05 DIAGNOSIS — Z791 Long term (current) use of non-steroidal anti-inflammatories (NSAID): Secondary | ICD-10-CM | POA: Insufficient documentation

## 2019-07-05 DIAGNOSIS — Z419 Encounter for procedure for purposes other than remedying health state, unspecified: Secondary | ICD-10-CM

## 2019-07-05 DIAGNOSIS — Z981 Arthrodesis status: Secondary | ICD-10-CM | POA: Diagnosis not present

## 2019-07-05 DIAGNOSIS — M47816 Spondylosis without myelopathy or radiculopathy, lumbar region: Secondary | ICD-10-CM | POA: Diagnosis not present

## 2019-07-05 DIAGNOSIS — M059 Rheumatoid arthritis with rheumatoid factor, unspecified: Secondary | ICD-10-CM | POA: Diagnosis not present

## 2019-07-05 DIAGNOSIS — Z880 Allergy status to penicillin: Secondary | ICD-10-CM | POA: Insufficient documentation

## 2019-07-05 HISTORY — DX: Interstitial cystitis (chronic) without hematuria: N30.10

## 2019-07-05 HISTORY — PX: KYPHOPLASTY: SHX5884

## 2019-07-05 SURGERY — KYPHOPLASTY
Anesthesia: General | Site: Back

## 2019-07-05 MED ORDER — METOCLOPRAMIDE HCL 10 MG PO TABS: 5.0000 mg | ORAL_TABLET | Freq: Three times a day (TID) | ORAL | Status: DC | PRN

## 2019-07-05 MED ORDER — ONDANSETRON HCL 4 MG PO TABS: 4.0000 mg | ORAL_TABLET | Freq: Four times a day (QID) | ORAL | Status: DC | PRN

## 2019-07-05 MED ORDER — ONDANSETRON HCL 4 MG/2ML IJ SOLN: 4.0000 mg | Freq: Four times a day (QID) | INTRAMUSCULAR | Status: DC | PRN

## 2019-07-05 MED ORDER — KETAMINE HCL 50 MG/ML IJ SOLN
INTRAMUSCULAR | Status: DC | PRN
  Administered 2019-07-05: 5 mg via INTRAVENOUS
  Administered 2019-07-05: 10 mg via INTRAVENOUS

## 2019-07-05 MED ORDER — LACTATED RINGERS IV SOLN: INTRAVENOUS | Status: DC

## 2019-07-05 MED ORDER — FENTANYL CITRATE (PF) 100 MCG/2ML IJ SOLN: 25.0000 ug | INTRAMUSCULAR | Status: DC | PRN

## 2019-07-05 MED ORDER — LIDOCAINE HCL (PF) 1 % IJ SOLN
INTRAMUSCULAR | Status: AC
  Filled 2019-07-05: qty 60

## 2019-07-05 MED ORDER — ONDANSETRON HCL 4 MG/2ML IJ SOLN
INTRAMUSCULAR | Status: DC | PRN
  Administered 2019-07-05: 4 mg via INTRAVENOUS

## 2019-07-05 MED ORDER — BUPIVACAINE-EPINEPHRINE (PF) 0.5% -1:200000 IJ SOLN
INTRAMUSCULAR | Status: AC
  Filled 2019-07-05: qty 30

## 2019-07-05 MED ORDER — ACETAMINOPHEN 500 MG PO TABS
1000.0000 mg | ORAL_TABLET | Freq: Once | ORAL | Status: AC
  Filled 2019-07-05: qty 2

## 2019-07-05 MED ORDER — KETAMINE HCL 50 MG/ML IJ SOLN
INTRAMUSCULAR | Status: AC
  Filled 2019-07-05: qty 10

## 2019-07-05 MED ORDER — LIDOCAINE HCL (PF) 2 % IJ SOLN
INTRAMUSCULAR | Status: AC
  Filled 2019-07-05: qty 10

## 2019-07-05 MED ORDER — PROPOFOL 10 MG/ML IV BOLUS
INTRAVENOUS | Status: DC | PRN
  Administered 2019-07-05: 40 ug/kg/min via INTRAVENOUS
  Administered 2019-07-05 (×2): 10 mg via INTRAVENOUS

## 2019-07-05 MED ORDER — ACETAMINOPHEN 500 MG PO TABS
ORAL_TABLET | ORAL | Status: AC
  Administered 2019-07-05: 1000 mg via ORAL
  Filled 2019-07-05: qty 2

## 2019-07-05 MED ORDER — BUPIVACAINE-EPINEPHRINE (PF) 0.5% -1:200000 IJ SOLN
INTRAMUSCULAR | Status: DC | PRN
  Administered 2019-07-05: 20 mL via PERINEURAL

## 2019-07-05 MED ORDER — METOCLOPRAMIDE HCL 5 MG/ML IJ SOLN: 5.0000 mg | Freq: Three times a day (TID) | INTRAMUSCULAR | Status: DC | PRN

## 2019-07-05 MED ORDER — LIDOCAINE HCL 1 % IJ SOLN
INTRAMUSCULAR | Status: DC | PRN
  Administered 2019-07-05: 30 mL

## 2019-07-05 MED ORDER — SODIUM CHLORIDE 0.9 % IV SOLN: INTRAVENOUS | Status: DC

## 2019-07-05 MED ORDER — ONDANSETRON HCL 4 MG/2ML IJ SOLN: 4.0000 mg | Freq: Once | INTRAMUSCULAR | Status: DC | PRN

## 2019-07-05 MED ORDER — PROPOFOL 10 MG/ML IV BOLUS
INTRAVENOUS | Status: AC
Start: 2019-07-05 — End: ?
  Filled 2019-07-05: qty 20

## 2019-07-05 SURGICAL SUPPLY — 23 items
CEMENT KYPHON CX01A KIT/MIXER (Cement) ×3 IMPLANT
COUNTER NEEDLE 20/40 LG (NEEDLE) ×2 IMPLANT
COVER WAND RF STERILE (DRAPES) ×3 IMPLANT
DERMABOND ADVANCED (GAUZE/BANDAGES/DRESSINGS) ×2
DERMABOND ADVANCED .7 DNX12 (GAUZE/BANDAGES/DRESSINGS) ×1 IMPLANT
DEVICE BIOPSY BONE KYPHX (INSTRUMENTS) ×3 IMPLANT
DRAPE C-ARM XRAY 36X54 (DRAPES) ×3 IMPLANT
DURAPREP 26ML APPLICATOR (WOUND CARE) ×3 IMPLANT
FEE RENTAL RFA GENERATOR (MISCELLANEOUS) IMPLANT
GLOVE SURG SYN 9.0  PF PI (GLOVE) ×2
GLOVE SURG SYN 9.0 PF PI (GLOVE) ×1 IMPLANT
GOWN SRG 2XL LVL 4 RGLN SLV (GOWNS) ×1 IMPLANT
GOWN STRL NON-REIN 2XL LVL4 (GOWNS) ×2
GOWN STRL REUS W/ TWL LRG LVL3 (GOWN DISPOSABLE) ×1 IMPLANT
GOWN STRL REUS W/TWL LRG LVL3 (GOWN DISPOSABLE) ×2
PACK KYPHOPLASTY (MISCELLANEOUS) ×3 IMPLANT
RENTAL RFA  GENERATOR (MISCELLANEOUS)
RENTAL RFA GENERATOR (MISCELLANEOUS) IMPLANT
STRAP SAFETY 5IN WIDE (MISCELLANEOUS) ×3 IMPLANT
SUT ETHILON 2 0 FS 18 (SUTURE) ×2 IMPLANT
SWABSTK COMLB BENZOIN TINCTURE (MISCELLANEOUS) ×3 IMPLANT
TRAY KYPHOPAK 15/3 EXPRESS 1ST (MISCELLANEOUS) ×3 IMPLANT
TRAY KYPHOPAK 20/3 EXPRESS 1ST (MISCELLANEOUS) ×1 IMPLANT

## 2019-07-05 NOTE — Anesthesia Preprocedure Evaluation (Signed)
Anesthesia Evaluation  Patient identified by MRN, date of birth, ID band Patient awake    Reviewed: Allergy & Precautions, H&P , NPO status , Patient's Chart, lab work & pertinent test results, reviewed documented beta blocker date and time   History of Anesthesia Complications (+) PONV and history of anesthetic complications  Airway Mallampati: IV  TM Distance: >3 FB Neck ROM: full    Dental  (+) Dental Advidsory Given, Caps, Teeth Intact   Pulmonary neg shortness of breath, asthma , sleep apnea , COPD, neg recent URI,    Pulmonary exam normal breath sounds clear to auscultation       Cardiovascular Exercise Tolerance: Good (-) hypertension(-) angina(-) Past MI and (-) Cardiac Stents Normal cardiovascular exam(-) dysrhythmias + Valvular Problems/Murmurs  Rhythm:regular Rate:Normal     Neuro/Psych negative neurological ROS  negative psych ROS   GI/Hepatic Neg liver ROS, GERD  ,  Endo/Other  neg diabetesMorbid obesity  Renal/GU negative Renal ROS  negative genitourinary   Musculoskeletal   Abdominal   Peds  Hematology negative hematology ROS (+)   Anesthesia Other Findings Past Medical History: No date: Allergy No date: Asthma 06/2019: Bowel obstruction (HCC) No date: Complication of anesthesia No date: COPD (chronic obstructive pulmonary disease) (HCC) No date: GERD (gastroesophageal reflux disease) No date: Heart murmur No date: Interstitial cystitis No date: PONV (postoperative nausea and vomiting)     Comment:  after 1st cataract surgery No date: Sleep apnea     Comment:  mild-no cpap    Reproductive/Obstetrics negative OB ROS                             Anesthesia Physical Anesthesia Plan  ASA: III  Anesthesia Plan: General   Post-op Pain Management:    Induction: Intravenous  PONV Risk Score and Plan: 4 or greater and Propofol infusion and TIVA  Airway Management  Planned: Natural Airway and Simple Face Mask  Additional Equipment:   Intra-op Plan:   Post-operative Plan:   Informed Consent: I have reviewed the patients History and Physical, chart, labs and discussed the procedure including the risks, benefits and alternatives for the proposed anesthesia with the patient or authorized representative who has indicated his/her understanding and acceptance.     Dental Advisory Given  Plan Discussed with: Anesthesiologist, CRNA and Surgeon  Anesthesia Plan Comments:         Anesthesia Quick Evaluation

## 2019-07-05 NOTE — H&P (Signed)
Chief Complaint  Patient presents with  . Establish Care  T12 Compression fracture   History of the Present Illness: Amber Kirk is a 85 y.o. female here for evaluation of a T12 compression fracture. She was admitted to the hospital and then discharged, and returns today to see if she needs outpatient kyphoplasty. She has has a bladder stimulator and cannot have an MRI, but her CT clearly shows a inferior endplate fracture of 624THL that is acute to subacute.  The patient states her pain is a 15 on a scale of 1-10. She is not able to get out of bed on her own.   The patient states she has degenerative disc disease from her waist down. She also has GI issues and her GI system had not been working properly since Friday. She went to the hospital and received MiraLAX with relief.   I have reviewed past medical, surgical, social and family history, and allergies as documented in the EMR.  Past Medical History: Past Medical History:  Diagnosis Date  . Asthma without status asthmaticus, unspecified  . COPD (chronic obstructive pulmonary disease) (CMS-HCC)  . Enthesopathy of hip region  . Interstitial cystitis  . Lumbago  . OA (osteoarthritis) ([place]) 12/16/2013  a. Knees. b. Lumbar spine.  . Obesity  . Osteoarthritis of both knees  . Osteoarthritis of lumbar spine  . Osteoarthrosis, unspecified whether generalized or localized, lower leg  . Other and unspecified hyperlipidemia  . Reactive airway disease  . Rheumatoid arthritis with rheumatoid factor ([place]) 12/16/2013  a. Positive rheumatoid factor. Low positive anti-CCP antibody at 20. b. Plaquenil.  . Stress headaches  . Venous stasis   Past Surgical History: Past Surgical History:  Procedure Laterality Date  . APPENDECTOMY  . CATARACT EXTRACTION  . CHOLECYSTECTOMY  . OTHER SURGERY  Hysterectomy ([facility])  . OTHER SURGERY 1959  Thoracic tumor  . OTHER SURGERY  Bladder procedures x 2  . OTHER SURGERY  Hernia  . OTHER SURGERY   Retinal surgery  . OTHER SURGERY  Trigger finger  . OTHER SURGERY June 2012  InterStim placement - Dr. Jacqlyn Larsen  . TONSILLECTOMY & ADENOIDECTOMY   Past Family History: Family History  Problem Relation Age of Onset  . Heart disease Mother  . Heart failure Mother  died at 68 of heart failure  . Stroke Father  died at 47 1/2 of stroke   Medications: Current Outpatient Medications Ordered in Epic  Medication Sig Dispense Refill  . albuterol (PROVENTIL) 2.5 mg /3 mL (0.083 %) nebulizer solution Take 3 mLs (2.5 mg total) by nebulization 4 (four) times daily 1080 mL 0  . aloe vera 25 mg Cap aloe vera 25 mg capsule  . aspirin 81 MG EC tablet Take 81 mg by mouth once daily.  Marland Kitchen azelastine (ASTELIN) 137 mcg nasal spray azelastine 137 mcg (0.1 %) nasal spray aerosol  . cetirizine (ZYRTEC) 10 MG tablet Take 10 mg by mouth once daily  . cholecalciferol (CHOLECALCIFEROL) 1,000 unit tablet Take 1,000 Units by mouth once daily.  Marland Kitchen DOCUSATE CALCIUM (STOOL SOFTENER ORAL) Take by mouth.  . doxepin (SINEQUAN) 25 MG capsule Take 1 capsule (25 mg total) by mouth nightly 90 capsule 0  . fluticasone propionate (FLONASE) 50 mcg/actuation nasal spray Place 2 sprays into both nostrils once daily 16 g 11  . hydrOXYchloroQUINE (PLAQUENIL) 200 mg tablet Take 2 tablets (400 mg total) by mouth once daily (Patient taking differently: Take 100 mg by mouth once daily ) 180 tablet 0  .  linaCLOtide (LINZESS) 145 mcg capsule Take 1 capsule (145 mcg total) by mouth once daily as needed 30 capsule 0  . lovastatin (MEVACOR) 20 MG tablet Take 1 tablet (20 mg total) by mouth daily with dinner 90 tablet 0  . meloxicam (MOBIC) 15 MG tablet  . montelukast (SINGULAIR) 10 mg tablet Take 1 tablet (10 mg total) by mouth once daily 90 tablet 1  . olopatadine (PATADAY) 0.2 % ophthalmic solution  . omeprazole (PRILOSEC) 20 MG DR capsule Take 1 capsule (20 mg total) by mouth 2 (two) times daily. 180 capsule 0  . PROAIR HFA 90  mcg/actuation inhaler Inhale 2 puff using inhaler every four to six hours as needed for whee zing 8.5 g 0  . red yeast rice 600 mg Cap red yeast rice 600 mg capsule  . SYMBICORT 160-4.5 mcg/actuation inhaler Inhale 2 inhalations into the lungs 2 (two) times daily. 30.6 g 3  . trospium (SANCTURA) 20 mg tablet Take 20 mg by mouth nightly  . ondansetron (ZOFRAN-ODT) 4 MG disintegrating tablet Take 1 tablet (4 mg total) by mouth every 8 (eight) hours as needed for Nausea for up to 7 days 20 tablet 0   No current Epic-ordered facility-administered medications on file.   Allergies: Allergies  Allergen Reactions  . Penicillins Hives, Rash and Unknown  And itching  . Morphine Nausea And Vomiting and Nausea  . Opioids - Morphine Analogues Nausea and Vomiting  . Biaxin [Clarithromycin] Unknown  . Ciprofloxacin Unknown  . Cleocin [Clindamycin Hcl] Unknown  . Codeine Sulfate Unknown  . Darvocet A500 [Propoxyphene N-Acetaminophen] Unknown  . Demerol [Meperidine] Unknown  . Dilaudid [Hydromorphone] Unknown  . Hycodan (With Homatropin) [Hydrocodone-Homatropine] Unknown  . Lidocaine Unknown  . Monistat 1 (Tioconazole) [Tioconazole] Unknown  . Parafon Forte Dsc [Chlorzoxazone] Unknown  . Premarin [Conjugated Estrogens] Unknown  . Sulfa (Sulfonamide Antibiotics) Unknown  . Tigan [Trimethobenzamide] Unknown    Body mass index is 41.8 kg/m.  Review of Systems: A comprehensive 14 point ROS was performed, reviewed, and the pertinent orthopaedic findings are documented in the HPI.  Vitals:  07/01/19 1024  BP: 148/86   General Physical Examination:  General/Constitutional: No apparent distress: well-nourished and well developed. Eyes: Pupils equal, round with synchronous movement. Lungs: Clear to auscultation HEENT: Normal Vascular: No edema, swelling or tenderness, except as noted in detailed exam. Cardiac: Heart rate and rhythm is regular. Integumentary: No impressive skin lesions  present, except as noted in detailed exam. Neuro/Psych: Normal mood and affect, oriented to person, place and time.  Musculoskeletal Examination: On exam, tenderness at T12. No clonus. Lungs are clear. Heart rate and rhythm is normal. HEENT is normal. Teeth: Nothing loose or removable.   Radiographs: No new imaging studies were obtained or reviewed today.  Assessment: Plan: The patient has clinical findings of T12 compression fracture with severe pain, not alleviated with pain medication.   We discussed the patient's treatment options. I explained kyphoplasty and the postoperative course in detail. The patient would like to proceed with surgery.   We will schedule the patient for T12 kyphoplasty, hopefully tomorrow.   Surgical Risks:  The nature of the condition and the proposed procedure has been reviewed in detail with the patient. Surgical versus non-surgical options and prognosis for recovery have been reviewed and the inherent risks and benefits of each have been discussed including the risks of infection, bleeding, injury to nerves/blood vessels/tendons, incomplete relief of symptoms, persisting pain and/or stiffness, loss of function, complex regional pain syndrome, failure  of the procedure, as appropriate.  Teeth: Nothing loose or removable.   Scribe Attestation: I, Dawn Royse, am acting as scribe for [employer], MD    Electronically signed by Lauris Poag, MD at 07/02/2019 7:35 PM EDT  ,mjmu

## 2019-07-05 NOTE — Op Note (Signed)
Date July 05, 2019  time 12:40 PM   PATIENT:  Amber Kirk   PRE-OPERATIVE DIAGNOSIS:  closed wedge compression fracture of T12   POST-OPERATIVE DIAGNOSIS:  closed wedge compression fracture of T12   PROCEDURE:  Procedure(s): KYPHOPLASTY T12  SURGEON: Laurene Footman, MD   ASSISTANTS: None   ANESTHESIA:   local and MAC   EBL:  No intake/output data recorded.   BLOOD ADMINISTERED:none   DRAINS: none    LOCAL MEDICATIONS USED:  MARCAINE    and XYLOCAINE    SPECIMEN:   None   DISPOSITION OF SPECIMEN:  Not applicable   COUNTS:  YES   TOURNIQUET:  * No tourniquets in log *   IMPLANTS: Bone cement   DICTATION: .Dragon Dictation  patient was brought to the operating room and after adequate anesthesia was obtained the patient was placed prone.  C arm was brought in in good visualization of the affected level obtained on both AP and lateral projections.  There was significant compression compared to the prior CT scan After patient identification and timeout procedures were completed, local anesthetic was infiltrated with 10 cc 1% Xylocaine infiltrated subcutaneously.  This is done the area on the each side of the planned approach.  The back was then prepped and draped in the usual sterile manner and repeat timeout procedure carried out.  A spinal needle was brought down to the pedicle on the each side of  T12 and a 50-50 mix of 1% Xylocaine half percent Sensorcaine with epinephrine total of 20 cc injected.  After allowing this to set a small incision was made and the trocar was advanced into the vertebral body in an extrapedicular fashion on the left side.  Biopsy was attempted but not successful Drilling was carried out balloon inserted with inflation to  5 cc and across the midline and gave partial correction of the deformity.  When the cement was appropriate consistency 7 cc were injected into the vertebral body without extravasation, good fill superior to inferior endplates and  from right to left sides along the inferior endplate.  After the cement had set the trochar was removed and permanent C-arm views obtained.  The wound was closed with Dermabond followed by Band-Aid   PLAN OF CARE: Discharge to home after PACU   PATIENT DISPOSITION:  PACU - hemodynamically stable.

## 2019-07-05 NOTE — Transfer of Care (Signed)
Immediate Anesthesia Transfer of Care Note  Patient: Amber Kirk  Procedure(s) Performed: T12 KYPHOPLASTY (N/A Back)  Patient Location: PACU  Anesthesia Type:General  Level of Consciousness: awake, alert , oriented and patient cooperative  Airway & Oxygen Therapy: Patient Spontanous Breathing  Post-op Assessment: Report given to RN and Post -op Vital signs reviewed and stable  Post vital signs: Reviewed and stable  Last Vitals:  Vitals Value Taken Time  BP 147/51 07/05/19 1243  Temp    Pulse 95 07/05/19 1246  Resp 20 07/05/19 1246  SpO2 97 % 07/05/19 1246  Vitals shown include unvalidated device data.  Last Pain:  Vitals:   07/05/19 1242  TempSrc:   PainSc: (P) 0-No pain         Complications: No apparent anesthesia complications

## 2019-07-05 NOTE — Anesthesia Postprocedure Evaluation (Signed)
Anesthesia Post Note  Patient: Amber Kirk  Procedure(s) Performed: T12 KYPHOPLASTY (N/A Back)  Patient location during evaluation: PACU Anesthesia Type: General Level of consciousness: awake and alert Pain management: pain level controlled Vital Signs Assessment: post-procedure vital signs reviewed and stable Respiratory status: spontaneous breathing, nonlabored ventilation, respiratory function stable and patient connected to nasal cannula oxygen Cardiovascular status: blood pressure returned to baseline and stable Postop Assessment: no apparent nausea or vomiting Anesthetic complications: no     Last Vitals:  Vitals:   07/05/19 1315 07/05/19 1330  BP: 139/64   Pulse: 94   Resp: (!) 25   Temp:  36.6 C  SpO2: 93%     Last Pain:  Vitals:   07/05/19 1330  TempSrc:   PainSc: 4                  Martha Clan

## 2019-07-05 NOTE — Discharge Instructions (Signed)
Take it easy today and tomorrow. Avoid bending at the waist or lifting over 5 pounds for 2 weeks. Remove Band-Aid on Sunday then okay to shower. Pain medicine as previously directed. Call office if there is a change in your condition.

## 2019-07-08 ENCOUNTER — Encounter: Payer: Self-pay | Admitting: *Deleted

## 2019-07-14 DIAGNOSIS — N390 Urinary tract infection, site not specified: Secondary | ICD-10-CM | POA: Diagnosis not present

## 2019-07-14 DIAGNOSIS — A499 Bacterial infection, unspecified: Secondary | ICD-10-CM | POA: Diagnosis not present

## 2019-07-14 DIAGNOSIS — R300 Dysuria: Secondary | ICD-10-CM | POA: Diagnosis not present

## 2019-07-15 ENCOUNTER — Emergency Department
Admission: EM | Admit: 2019-07-15 | Discharge: 2019-07-15 | Disposition: A | Discharge status: Home / Self Care | Payer: PPO | Attending: Emergency Medicine | Admitting: Emergency Medicine

## 2019-07-15 ENCOUNTER — Other Ambulatory Visit: Payer: Self-pay

## 2019-07-15 ENCOUNTER — Emergency Department: Payer: PPO

## 2019-07-15 ENCOUNTER — Encounter: Payer: Self-pay | Admitting: *Deleted

## 2019-07-15 DIAGNOSIS — Y33XXXA Other specified events, undetermined intent, initial encounter: Secondary | ICD-10-CM | POA: Diagnosis not present

## 2019-07-15 DIAGNOSIS — M5489 Other dorsalgia: Secondary | ICD-10-CM | POA: Diagnosis not present

## 2019-07-15 DIAGNOSIS — R110 Nausea: Secondary | ICD-10-CM | POA: Diagnosis not present

## 2019-07-15 DIAGNOSIS — Y999 Unspecified external cause status: Secondary | ICD-10-CM | POA: Diagnosis not present

## 2019-07-15 DIAGNOSIS — Z79899 Other long term (current) drug therapy: Secondary | ICD-10-CM | POA: Insufficient documentation

## 2019-07-15 DIAGNOSIS — W19XXXA Unspecified fall, initial encounter: Secondary | ICD-10-CM | POA: Diagnosis not present

## 2019-07-15 DIAGNOSIS — J449 Chronic obstructive pulmonary disease, unspecified: Secondary | ICD-10-CM | POA: Diagnosis not present

## 2019-07-15 DIAGNOSIS — S299XXA Unspecified injury of thorax, initial encounter: Secondary | ICD-10-CM | POA: Diagnosis not present

## 2019-07-15 DIAGNOSIS — S3992XA Unspecified injury of lower back, initial encounter: Secondary | ICD-10-CM | POA: Diagnosis present

## 2019-07-15 DIAGNOSIS — R531 Weakness: Secondary | ICD-10-CM | POA: Diagnosis not present

## 2019-07-15 DIAGNOSIS — Y939 Activity, unspecified: Secondary | ICD-10-CM | POA: Diagnosis not present

## 2019-07-15 DIAGNOSIS — S32019A Unspecified fracture of first lumbar vertebra, initial encounter for closed fracture: Secondary | ICD-10-CM | POA: Insufficient documentation

## 2019-07-15 DIAGNOSIS — Y929 Unspecified place or not applicable: Secondary | ICD-10-CM | POA: Diagnosis not present

## 2019-07-15 DIAGNOSIS — Z7982 Long term (current) use of aspirin: Secondary | ICD-10-CM | POA: Diagnosis not present

## 2019-07-15 DIAGNOSIS — S32010A Wedge compression fracture of first lumbar vertebra, initial encounter for closed fracture: Secondary | ICD-10-CM | POA: Diagnosis not present

## 2019-07-15 DIAGNOSIS — R52 Pain, unspecified: Secondary | ICD-10-CM | POA: Diagnosis not present

## 2019-07-15 DIAGNOSIS — J9811 Atelectasis: Secondary | ICD-10-CM | POA: Diagnosis not present

## 2019-07-15 DIAGNOSIS — R000 Tachycardia, unspecified: Secondary | ICD-10-CM | POA: Diagnosis not present

## 2019-07-15 LAB — CBC
HCT: 39.4 % (ref 36.0–46.0)
Hemoglobin: 12.8 g/dL (ref 12.0–15.0)
MCH: 26.8 pg (ref 26.0–34.0)
MCHC: 32.5 g/dL (ref 30.0–36.0)
MCV: 82.4 fL (ref 80.0–100.0)
Platelets: 323 10*3/uL (ref 150–400)
RBC: 4.78 MIL/uL (ref 3.87–5.11)
RDW: 14.4 % (ref 11.5–15.5)
WBC: 9.8 10*3/uL (ref 4.0–10.5)
nRBC: 0 % (ref 0.0–0.2)

## 2019-07-15 LAB — BASIC METABOLIC PANEL
Anion gap: 10 (ref 5–15)
BUN: 16 mg/dL (ref 8–23)
CO2: 28 mmol/L (ref 22–32)
Calcium: 9.6 mg/dL (ref 8.9–10.3)
Chloride: 95 mmol/L — ABNORMAL LOW (ref 98–111)
Creatinine, Ser: 0.58 mg/dL (ref 0.44–1.00)
GFR calc Af Amer: >60 mL/min (ref >60)
GFR calc non Af Amer: >60 mL/min (ref >60)
Glucose, Bld: 115 mg/dL — ABNORMAL HIGH (ref 70–99)
Potassium: 4.6 mmol/L (ref 3.5–5.1)
Sodium: 133 mmol/L — ABNORMAL LOW (ref 135–145)

## 2019-07-15 LAB — TROPONIN I (HIGH SENSITIVITY): Troponin I (High Sensitivity): 11 ng/L (ref <18)

## 2019-07-15 LAB — URINALYSIS, COMPLETE (UACMP) WITH MICROSCOPIC
Bilirubin Urine: NEGATIVE
Glucose, UA: NEGATIVE mg/dL
Hgb urine dipstick: NEGATIVE
Ketones, ur: NEGATIVE mg/dL
Nitrite: NEGATIVE
Protein, ur: NEGATIVE mg/dL
Specific Gravity, Urine: 1.014 (ref 1.005–1.030)
pH: 7 (ref 5.0–8.0)

## 2019-07-15 MED ORDER — SODIUM CHLORIDE 0.9% FLUSH: 3.0000 mL | Freq: Once | INTRAVENOUS | Status: DC

## 2019-07-15 NOTE — ED Provider Notes (Signed)
[facility] Emergency Department Provider Note  Time seen: 6:40 PM  I have reviewed the triage vital signs and the nursing notes.   HISTORY  Chief Complaint Weakness   HPI Sami Laquita Lecount is a 85 y.o. female with a past medical history of asthma, COPD, gastric reflux, chronic back pain with recent kyphoplasty presents to the emergency department for fall/weakness and worsening back pain.  According to the patient she had a kyphoplasty performed little over 1 week ago.  States the pain in that area of the back has improved but she is now experiencing worsening pain in the mid to lower back.  Patient states today she was walking with her walker and experienced increased pain and almost fell, states she slumped over her walker and was able to lower herself down onto her knees but did not fall to the ground.  Patient denies any no new trauma or significant trauma at least.  Patient was diagnosed with urinary tract infection yesterday per patient has been on antibiotics since last night including today.  Denies any dysuria.  Denies any known fever.  No nausea vomiting.   Past Medical History:  Diagnosis Date  . Allergy   . Asthma   . Bowel obstruction ([place]) 06/2019  . Complication of anesthesia   . COPD (chronic obstructive pulmonary disease) ([place])   . GERD (gastroesophageal reflux disease)   . Heart murmur   . Interstitial cystitis   . PONV (postoperative nausea and vomiting)    after 1st cataract surgery  . Sleep apnea    mild-no cpap     Patient Active Problem List   Diagnosis Date Noted  . Ileus ([place])   . Constipation   . Compression fracture of T12 vertebra (HCC)   . Colitis 06/27/2019  . Chronic venous insufficiency 07/04/2016  . Lymphedema 07/04/2016  . Asthma 07/04/2016  . Hyperlipidemia 07/04/2016    Past Surgical History:  Procedure Laterality Date  . ABDOMINAL HYSTERECTOMY    . APPENDECTOMY    . BLADDER SURGERY    .  CHOLECYSTECTOMY    . EYE SURGERY     bil cataracts  . KYPHOPLASTY N/A 07/05/2019   Procedure: T12 KYPHOPLASTY;  Surgeon: Hessie Knows, MD;  Location: [facility] ORS;  Service: Orthopedics;  Laterality: N/A;  . TONSILLECTOMY      Prior to Admission medications   Medication Sig Start Date End Date Taking? Authorizing Provider  acetaminophen (TYLENOL) 500 MG tablet Take 1,000 mg by mouth every 6 (six) hours as needed.    [provider]  albuterol (VENTOLIN HFA) 108 (90 Base) MCG/ACT inhaler Inhale 1-2 puffs into the lungs every 6 (six) hours as needed for wheezing or shortness of breath.    [provider]  Aloe Vera Freeze Dried POWD Take 3 capsules by mouth in the morning and at bedtime.    [provider]  aspirin EC 81 MG tablet Take 81 mg by mouth daily.  04/25/12   [provider]  azelastine (ASTELIN) 0.1 % nasal spray Place 1 spray into both nostrils 2 (two) times daily. Use in each nostril as directed    [provider]  Calcium Carbonate (CALCIUM 500 PO) Take 500 mg by mouth daily.    [provider]  cetirizine (ZYRTEC) 10 MG tablet Take 10 mg by mouth every morning.     [provider]  cholecalciferol (VITAMIN D) 25 MCG (1000 UNIT) tablet Take 1,000 Units by mouth daily.    [provider]  docusate sodium (COLACE) 100 MG capsule Take 1 capsule (100 mg total) by mouth 2 (two) times daily. 06/29/19   Fritzi Mandes, MD  doxepin (SINEQUAN) 25 MG capsule Take 25 mg by mouth at bedtime.     [provider]  fluticasone (FLONASE) 50 MCG/ACT nasal spray Place 1 spray into both nostrils in the morning and at bedtime.     [provider]  hydroxychloroquine (PLAQUENIL) 200 MG tablet Take 100 mg by mouth daily.     [provider]  LINZESS 145 MCG CAPS capsule Take 145 mcg by mouth daily as needed (constipation.).  06/25/19   [provider]  lovastatin (MEVACOR) 20 MG tablet Take 20 mg by mouth  daily with supper.     [provider]  montelukast (SINGULAIR) 10 MG tablet Take 10 mg by mouth every evening.     [provider]  Multiple Vitamins-Minerals (ADULT GUMMY PO) Take 2 tablets by mouth daily. Vitafusion Women's    [provider]  olopatadine (PATADAY) 0.1 % ophthalmic solution Place 1 drop into both eyes 2 (two) times daily as needed for allergies.     [provider]  omeprazole (PRILOSEC) 20 MG capsule Take 20 mg by mouth in the morning and at bedtime.     [provider]  ondansetron (ZOFRAN-ODT) 4 MG disintegrating tablet Take 4 mg by mouth every 8 (eight) hours as needed for nausea/vomiting. 07/01/19   [provider]  polyethylene glycol (MIRALAX / GLYCOLAX) 17 g packet Take 17 g by mouth 2 (two) times daily. 06/29/19   Fritzi Mandes, MD  SYMBICORT 160-4.5 MCG/ACT inhaler Inhale 2 puffs into the lungs in the morning and at bedtime.     [provider]  trospium (SANCTURA) 20 MG tablet Take 20 mg by mouth at bedtime.    [provider]    Allergies  Allergen Reactions  . Penicillins Hives, Rash and Other (See Comments)    And itching  . Morphine And Related Nausea And Vomiting and Nausea Only  . Chlorzoxazone Nausea And Vomiting  . Ciprofloxacin Nausea And Vomiting  . Clarithromycin Nausea And Vomiting  . Clindamycin Hcl Nausea And Vomiting  . Codeine Sulfate Nausea And Vomiting  . Conjugated Estrogens Nausea And Vomiting  . Hydrocodone-Homatropine Nausea And Vomiting  . Hydromorphone Nausea And Vomiting  . Lidocaine Nausea And Vomiting  . Meperidine Nausea And Vomiting  . Propoxyphene Nausea And Vomiting  . Sulfa Antibiotics Nausea And Vomiting  . Tioconazole Nausea Only    Family History  Problem Relation Age of Onset  . Heart disease Mother   . Stroke Father   . Breast cancer Neg Hx     Social History Social History   Tobacco Use  . Smoking status: Never Smoker  . Smokeless tobacco:  Never Used  Substance Use Topics  . Alcohol use: No  . Drug use: No    Review of Systems Constitutional: Negative for fever. Cardiovascular: Negative for chest pain. Respiratory: Negative for shortness of breath. Gastrointestinal: Negative for abdominal pain Genitourinary: Negative for urinary compaints Musculoskeletal: Moderate aching lower back pain. Skin: Negative for skin complaints  Neurological: Negative for headache All other ROS negative  ____________________________________________   PHYSICAL EXAM:  VITAL SIGNS: ED Triage Vitals  Enc Vitals Group     BP 07/15/19 1529 (!) 172/72     Pulse Rate 07/15/19 1529 99     Resp 07/15/19 1529 20     Temp 07/15/19  1529 98.5 F (36.9 C)     Temp Source 07/15/19 1529 Oral     SpO2 07/15/19 1529 96 %     Weight 07/15/19 1530 187 lb (84.8 kg)     Height 07/15/19 1530 4\' 11"  (1.499 m)     Head Circumference --      Peak Flow --      Pain Score 07/15/19 1530 10     Pain Loc --      Pain Edu? --      Excl. in [place]? --    Constitutional: Alert and oriented. Well appearing and in no distress. Eyes: Normal exam ENT      Head: Normocephalic and atraumatic.      Mouth/Throat: Mucous membranes are moist. Cardiovascular: Normal rate, regular rhythm. No murmur Respiratory: Normal respiratory effort without tachypnea nor retractions. Breath sounds are clear  Gastrointestinal: Soft and nontender. No distention.  Musculoskeletal: Nontender with normal range of motion in all extremities.  Neurologic:  Normal speech and language. No gross focal neurologic deficits  Skin:  Skin is warm, dry and intact.  Psychiatric: Mood and affect are normal.   ____________________________________________    EKG  EKG viewed and interpreted by myself shows sinus tachycardia 102 bpm with a widened QRS, left axis deviation.  Nonspecific ST changes  ____________________________________________    RADIOLOGY  Chest x-ray shows scattered  atelectasis.  T12 compression fracture status post kyphoplasty.  Compression fracture of endplate of L1 with mild kyphosis  ____________________________________________   INITIAL IMPRESSION / ASSESSMENT AND PLAN / ED COURSE  Pertinent labs & imaging results that were available during my care of the patient were reviewed by me and considered in my medical decision making (see chart for details).   Patient presents to the emergency department for lower back pain and also a fall/weakness.  Patient's lab work is largely [place].  X-ray shows possible new endplate fracture we will obtain CT images of the T and L-spine to further evaluate.  We will also obtain a urinalysis given the patient's recent urinary tract infection diagnosis.  Patient CT scan shows a T12 kyphoplasty with some retropulsion.  There is a L1 endplate compression fractures could be new.  I offered to discharge with pain medication.  Patient states she cannot tolerate pain medication.  Patient states she is having a hard time getting around at home.  I offered to keep the patient in the emergency department have social work see in the morning for rehab placement.  Patient states she does not want to go to rehab and wishes to go home.  States she has home health care already in place.  Son is here with the patient as well.  We will discharge the patient home she has an orthopedic appointment tomorrow morning  Ruthene Kalanie Yoss was evaluated in Emergency Department on 07/15/2019 for the symptoms described in the history of present illness. She was evaluated in the context of the global COVID-19 pandemic, which necessitated consideration that the patient might be at risk for infection with the SARS-CoV-2 virus that causes COVID-19. Institutional protocols and algorithms that pertain to the evaluation of patients at risk for COVID-19 are in a state of rapid change based on information released by regulatory bodies including the CDC  and federal and state organizations. These policies and algorithms were followed during the patient's care in the ED.  ____________________________________________   FINAL CLINICAL IMPRESSION(S) / ED DIAGNOSES  Back pain Fall L1 endplate fracture   Harvest Dark,  MD 07/15/19 2100

## 2019-07-15 NOTE — ED Notes (Signed)
Pt reports "sliding to the ground" around 1400 this PM. Pt reports using a walker at home, and her legs becoming too weak to hold her up as she transferred to her lift chair. Pt reports she lowered herself to her knees.  Pt reports caregiver called EMS who collected her from the ground.   Pt reports recent surgery on lower back, with increasing pain for past 2-3 days. Pt reports taking extra strength tylenol, last dose approx 1300.  Pt reports taking a muscle relaxer approx 1430 after the fall  Pt is taking antibiotics for UTI as of yesterday

## 2019-07-15 NOTE — ED Triage Notes (Signed)
Pt in via EMS from home with c/o fall and weakness  Pt had back surgery a week ago and today her legs went out and she dropped to her knees. Pt reports back pain Pt also recently diagnosed with UTI.  BP 141/79

## 2019-07-15 NOTE — ED Triage Notes (Signed)
Pt brought in via ems from home with weakness.  Pt reports back pain dx with uti yesterday and is taking abx.  No n/v/d.  Pt alert  Speech clear.

## 2019-07-15 NOTE — ED Notes (Signed)
Unable to void at this time.

## 2019-07-15 NOTE — ED Notes (Signed)
Pt reports pain "in the middle" of her lumbar. No unilateral hip/leg pain/weakness reported

## 2019-07-16 DIAGNOSIS — S32010A Wedge compression fracture of first lumbar vertebra, initial encounter for closed fracture: Secondary | ICD-10-CM | POA: Diagnosis not present

## 2019-07-16 DIAGNOSIS — Z9889 Other specified postprocedural states: Secondary | ICD-10-CM | POA: Diagnosis not present

## 2019-07-17 ENCOUNTER — Other Ambulatory Visit: Payer: Self-pay | Admitting: Orthopedic Surgery

## 2019-07-17 DIAGNOSIS — R32 Unspecified urinary incontinence: Secondary | ICD-10-CM | POA: Diagnosis not present

## 2019-07-17 DIAGNOSIS — Z7951 Long term (current) use of inhaled steroids: Secondary | ICD-10-CM | POA: Diagnosis not present

## 2019-07-17 DIAGNOSIS — M5410 Radiculopathy, site unspecified: Secondary | ICD-10-CM | POA: Diagnosis not present

## 2019-07-17 DIAGNOSIS — G8929 Other chronic pain: Secondary | ICD-10-CM | POA: Diagnosis not present

## 2019-07-17 DIAGNOSIS — J4520 Mild intermittent asthma, uncomplicated: Secondary | ICD-10-CM | POA: Diagnosis not present

## 2019-07-17 DIAGNOSIS — M170 Bilateral primary osteoarthritis of knee: Secondary | ICD-10-CM | POA: Diagnosis not present

## 2019-07-17 DIAGNOSIS — I872 Venous insufficiency (chronic) (peripheral): Secondary | ICD-10-CM | POA: Diagnosis not present

## 2019-07-17 DIAGNOSIS — Z9181 History of falling: Secondary | ICD-10-CM | POA: Diagnosis not present

## 2019-07-17 DIAGNOSIS — M069 Rheumatoid arthritis, unspecified: Secondary | ICD-10-CM | POA: Diagnosis not present

## 2019-07-17 DIAGNOSIS — F329 Major depressive disorder, single episode, unspecified: Secondary | ICD-10-CM | POA: Diagnosis not present

## 2019-07-17 DIAGNOSIS — S22088D Other fracture of T11-T12 vertebra, subsequent encounter for fracture with routine healing: Secondary | ICD-10-CM | POA: Diagnosis not present

## 2019-07-17 DIAGNOSIS — M47816 Spondylosis without myelopathy or radiculopathy, lumbar region: Secondary | ICD-10-CM | POA: Diagnosis not present

## 2019-07-17 DIAGNOSIS — E785 Hyperlipidemia, unspecified: Secondary | ICD-10-CM | POA: Diagnosis not present

## 2019-07-17 DIAGNOSIS — J449 Chronic obstructive pulmonary disease, unspecified: Secondary | ICD-10-CM | POA: Diagnosis not present

## 2019-07-17 DIAGNOSIS — Z6841 Body Mass Index (BMI) 40.0 and over, adult: Secondary | ICD-10-CM | POA: Diagnosis not present

## 2019-07-17 DIAGNOSIS — Z7982 Long term (current) use of aspirin: Secondary | ICD-10-CM | POA: Diagnosis not present

## 2019-07-17 DIAGNOSIS — K219 Gastro-esophageal reflux disease without esophagitis: Secondary | ICD-10-CM | POA: Diagnosis not present

## 2019-07-17 DIAGNOSIS — F419 Anxiety disorder, unspecified: Secondary | ICD-10-CM | POA: Diagnosis not present

## 2019-07-19 ENCOUNTER — Other Ambulatory Visit: Payer: Self-pay

## 2019-07-19 ENCOUNTER — Other Ambulatory Visit
Admission: RE | Admit: 2019-07-19 | Discharge: 2019-07-19 | Disposition: A | Discharge status: Home / Self Care | Payer: PPO | Source: Ambulatory Visit | Attending: Orthopedic Surgery | Admitting: Orthopedic Surgery

## 2019-07-19 DIAGNOSIS — Z20822 Contact with and (suspected) exposure to covid-19: Secondary | ICD-10-CM | POA: Insufficient documentation

## 2019-07-19 DIAGNOSIS — M170 Bilateral primary osteoarthritis of knee: Secondary | ICD-10-CM | POA: Diagnosis not present

## 2019-07-19 DIAGNOSIS — Z7982 Long term (current) use of aspirin: Secondary | ICD-10-CM | POA: Diagnosis not present

## 2019-07-19 DIAGNOSIS — Z9181 History of falling: Secondary | ICD-10-CM | POA: Diagnosis not present

## 2019-07-19 DIAGNOSIS — J4520 Mild intermittent asthma, uncomplicated: Secondary | ICD-10-CM | POA: Diagnosis not present

## 2019-07-19 DIAGNOSIS — I872 Venous insufficiency (chronic) (peripheral): Secondary | ICD-10-CM | POA: Diagnosis not present

## 2019-07-19 DIAGNOSIS — M069 Rheumatoid arthritis, unspecified: Secondary | ICD-10-CM | POA: Diagnosis not present

## 2019-07-19 DIAGNOSIS — Z01812 Encounter for preprocedural laboratory examination: Secondary | ICD-10-CM | POA: Diagnosis not present

## 2019-07-19 DIAGNOSIS — F329 Major depressive disorder, single episode, unspecified: Secondary | ICD-10-CM | POA: Diagnosis not present

## 2019-07-19 DIAGNOSIS — G8929 Other chronic pain: Secondary | ICD-10-CM | POA: Diagnosis not present

## 2019-07-19 DIAGNOSIS — M5410 Radiculopathy, site unspecified: Secondary | ICD-10-CM | POA: Diagnosis not present

## 2019-07-19 DIAGNOSIS — E785 Hyperlipidemia, unspecified: Secondary | ICD-10-CM | POA: Diagnosis not present

## 2019-07-19 DIAGNOSIS — R32 Unspecified urinary incontinence: Secondary | ICD-10-CM | POA: Diagnosis not present

## 2019-07-19 DIAGNOSIS — J449 Chronic obstructive pulmonary disease, unspecified: Secondary | ICD-10-CM | POA: Diagnosis not present

## 2019-07-19 DIAGNOSIS — Z7951 Long term (current) use of inhaled steroids: Secondary | ICD-10-CM | POA: Diagnosis not present

## 2019-07-19 DIAGNOSIS — F419 Anxiety disorder, unspecified: Secondary | ICD-10-CM | POA: Diagnosis not present

## 2019-07-19 DIAGNOSIS — Z6841 Body Mass Index (BMI) 40.0 and over, adult: Secondary | ICD-10-CM | POA: Diagnosis not present

## 2019-07-19 DIAGNOSIS — K219 Gastro-esophageal reflux disease without esophagitis: Secondary | ICD-10-CM | POA: Diagnosis not present

## 2019-07-19 DIAGNOSIS — M47816 Spondylosis without myelopathy or radiculopathy, lumbar region: Secondary | ICD-10-CM | POA: Diagnosis not present

## 2019-07-19 DIAGNOSIS — S22088D Other fracture of T11-T12 vertebra, subsequent encounter for fracture with routine healing: Secondary | ICD-10-CM | POA: Diagnosis not present

## 2019-07-19 LAB — SARS CORONAVIRUS 2 (TAT 6-24 HRS): SARS Coronavirus 2: NEGATIVE

## 2019-07-22 NOTE — H&P (Signed)
Chief Complaint  Patient presents with  . Lower Back - Pain   Amber Kirk is a 85 y.o. female who presents today for evaluation of severe midline low back pain. She is status post T12 kyphoplasty by Dr. Hessie Knows on 07/05/2019. For several days following this surgery she was making good progress each day. She denies any trauma or injury but over the last few days has had increasing back pain lower than where she was having her previous back pain from T12 compression fracture. She describes the same severe debilitating pain that is worse with standing and walking. She is currently able to ambulate with a walker but pain is severe, 10 out of 10. She has some pain with sitting and gets most relief with lying down. She is taken Tylenol. She does have tramadol but this causes nausea, has taken nausea medication in the past which is alleviated the nausea from tramadol. She denies any numbness tingling radicular symptoms. She was seen in the emergency department yesterday 07/15/2019, had CT scan of thoracic and lumbar spine showing what appears to be acute L1 compression fracture with no other evidence of new compression fractures throughout the lumbar and thoracic spine.  Last bone density scan showed osteopenia in 2017.  Past Medical History: Past Medical History:  Diagnosis Date  . Asthma without status asthmaticus, unspecified  . COPD (chronic obstructive pulmonary disease) (CMS-HCC)  . Enthesopathy of hip region  . Interstitial cystitis  . Lumbago  . OA (osteoarthritis) ([place]) 12/16/2013  a. Knees. b. Lumbar spine.  . Obesity  . Osteoarthritis of both knees  . Osteoarthritis of lumbar spine  . Osteoarthrosis, unspecified whether generalized or localized, lower leg  . Other and unspecified hyperlipidemia  . Reactive airway disease  . Rheumatoid arthritis with rheumatoid factor ([place]) 12/16/2013  a. Positive rheumatoid factor. Low positive anti-CCP antibody at 20. b. Plaquenil.  .  Stress headaches  . Venous stasis   Past Surgical History: Past Surgical History:  Procedure Laterality Date  . APPENDECTOMY  . CATARACT EXTRACTION  . CHOLECYSTECTOMY  . OTHER SURGERY  Hysterectomy ([facility])  . OTHER SURGERY 1959  Thoracic tumor  . OTHER SURGERY  Bladder procedures x 2  . OTHER SURGERY  Hernia  . OTHER SURGERY  Retinal surgery  . OTHER SURGERY  Trigger finger  . OTHER SURGERY June 2012  InterStim placement - Dr. Jacqlyn Larsen  . TONSILLECTOMY & ADENOIDECTOMY   Past Family History: Family History  Problem Relation Age of Onset  . Heart disease Mother  . Heart failure Mother  died at 55 of heart failure  . Stroke Father  died at 8 1/2 of stroke   Medications: Current Outpatient Medications Ordered in Epic  Medication Sig Dispense Refill  . albuterol (PROVENTIL) 2.5 mg /3 mL (0.083 %) nebulizer solution Take 3 mLs (2.5 mg total) by nebulization 4 (four) times daily 1080 mL 0  . aloe vera 25 mg Cap aloe vera 25 mg capsule  . aspirin 81 MG EC tablet Take 81 mg by mouth once daily.  Marland Kitchen azelastine (ASTELIN) 137 mcg nasal spray azelastine 137 mcg (0.1 %) nasal spray aerosol  . cefdinir (OMNICEF) 300 mg capsule Take 1 capsule (300 mg total) by mouth 2 (two) times daily for 7 days 14 capsule 0  . cetirizine (ZYRTEC) 10 MG tablet Take 10 mg by mouth once daily  . cholecalciferol (CHOLECALCIFEROL) 1,000 unit tablet Take 1,000 Units by mouth once daily.  Marland Kitchen DOCUSATE CALCIUM (STOOL SOFTENER ORAL) Take  by mouth.  . doxepin (SINEQUAN) 25 MG capsule Take 1 capsule (25 mg total) by mouth nightly 90 capsule 0  . fluticasone propionate (FLONASE) 50 mcg/actuation nasal spray Place 2 sprays into both nostrils once daily 16 g 11  . hydrOXYchloroQUINE (PLAQUENIL) 200 mg tablet Take 2 tablets (400 mg total) by mouth once daily (Patient taking differently: Take 100 mg by mouth once daily ) 180 tablet 0  . lidocaine (LIDODERM) 5 % patch Place 1 patch onto the skin daily Apply patch to the  most painful area for up to 12 hours in a 24 hour period. 10 patch 1  . linaCLOtide (LINZESS) 145 mcg capsule Take 1 capsule (145 mcg total) by mouth once daily as needed 30 capsule 0  . lovastatin (MEVACOR) 20 MG tablet Take 1 tablet (20 mg total) by mouth daily with dinner 90 tablet 0  . meloxicam (MOBIC) 15 MG tablet  . metaxalone (SKELAXIN) 800 mg tablet Take 1 tablet (800 mg total) by mouth 3 (three) times daily as needed for Pain (for pain or spasm) for up to 20 doses 20 tablet 0  . methylPREDNISolone (MEDROL DOSEPACK) 4 mg tablet Follow package directions. 21 tablet 0  . montelukast (SINGULAIR) 10 mg tablet Take 1 tablet (10 mg total) by mouth once daily 90 tablet 1  . olopatadine (PATADAY) 0.2 % ophthalmic solution  . omeprazole (PRILOSEC) 20 MG DR capsule Take 1 capsule (20 mg total) by mouth 2 (two) times daily. 180 capsule 0  . ondansetron (ZOFRAN-ODT) 4 MG disintegrating tablet Take 1 tablet (4 mg total) by mouth every 6 (six) hours as needed for Nausea for up to 7 days 20 tablet 0  . PROAIR HFA 90 mcg/actuation inhaler Inhale 2 puff using inhaler every four to six hours as needed for whee zing 8.5 g 0  . red yeast rice 600 mg Cap red yeast rice 600 mg capsule  . SYMBICORT 160-4.5 mcg/actuation inhaler Inhale 2 inhalations into the lungs 2 (two) times daily. 30.6 g 3  . traMADoL (ULTRAM) 50 mg tablet Take 0.5-1 tablets (25-50 mg total) by mouth every 6 (six) hours as needed for Pain for up to 5 days 20 tablet 0  . trospium (SANCTURA) 20 mg tablet Take 20 mg by mouth nightly   No current Epic-ordered facility-administered medications on file.   Allergies: Allergies  Allergen Reactions  . Penicillins Hives, Rash and Unknown  And itching  . Morphine Nausea And Vomiting and Nausea  . Opioids - Morphine Analogues Nausea and Vomiting  . Biaxin [Clarithromycin] Unknown  . Ciprofloxacin Unknown  . Cleocin [Clindamycin Hcl] Unknown  . Codeine Sulfate Unknown  . Darvocet A500  [Propoxyphene N-Acetaminophen] Unknown  . Demerol [Meperidine] Unknown  . Dilaudid [Hydromorphone] Unknown  . Hycodan (With Homatropin) [Hydrocodone-Homatropine] Unknown  . Lidocaine Unknown  . Monistat 1 (Tioconazole) [Tioconazole] Unknown  . Parafon Forte Dsc [Chlorzoxazone] Unknown  . Premarin [Conjugated Estrogens] Unknown  . Propoxyphene Nausea And Vomiting  . Sulfa (Sulfonamide Antibiotics) Unknown and Nausea And Vomiting  . Tigan [Trimethobenzamide] Unknown    Review of Systems:  A comprehensive 14 point ROS was performed, reviewed by me today, and the pertinent orthopaedic findings are documented in the HPI.  Exam: BP 136/82  Wt 90.7 kg (199 lb 15.3 oz)  LMP (LMP Unknown)  BMI 40.39 kg/m  General:  Well developed, well nourished, no apparent distress, normal affect, presents in a wheelchair.  HEENT: Head normocephalic, atraumatic, PERRL.   Abdomen: Soft, non tender,  non distended, Bowel sounds present.  Heart: Examination of the heart reveals regular, rate, and rhythm. There is no murmur noted on ascultation. There is a normal apical pulse.  Lungs: Lungs are clear to auscultation. There is no wheeze, rhonchi, or crackles. There is normal expansion of bilateral chest walls.   Thoracolumbar spine: Patient with moderate to severe midline back pain along the upper lumbar spine with percussion. No paravertebral muscle tenderness. No skin breakdown noted. Previous incision sites completely healed with no signs of any infection. No paravertebral muscle tenderness. Neurovascular tact in bilateral lower extremities. Good hip internal X rotation bilaterally with no discomfort.  EXAM:  CT THORACIC AND LUMBAR SPINE WITHOUT CONTRAST   TECHNIQUE:  Multidetector CT imaging of the thoracic and lumbar spine was  performed without contrast. Multiplanar CT image reconstructions  were also generated.   COMPARISON: 06/07/2016. CT abdomen 06/26/2019   FINDINGS:  CT THORACIC  SPINE FINDINGS   Alignment: No thoracic malalignment.   Vertebrae: No vertebral body abnormality at T11 or above. Previous  compression fracture at T12 with vertebral augmentation. Large  amount of methylmethacrylate within the vertebral body extending  from superior to inferior endplate. There is collapse of T12 with  loss of height of 40%. There is retropulsion of the posterior aspect  of the vertebral body by distance of 5-6 mm, narrowing the spinal  canal. The vertebra has collapsed further compared to the CT scan  06/26/2019.   Paraspinal and other soft tissues: No acute finding   Disc levels: No significant disc space pathology in the thoracic  region.   CT LUMBAR SPINE FINDINGS   Segmentation: 5 lumbar type vertebral bodies.   Alignment: Thoracolumbar curvature convex to the right and lower  lumbar curvature convex to the left.   Vertebrae: Minor fracture of the superior endplate of L1 not present  on the CT scan of 06/26/2019. No retropulsed bone at that level. No  fracture below that in the lumbar region or the upper sacrum.   Paraspinal and other soft tissues: Sacral neurostimulator in place  at the left S3 foramen. Aortic atherosclerosis.   Disc levels: L1-2: Disc degeneration with endplate osteophytes and  mild bulging of the disc but no stenosis.   L2-3: Disc degeneration with endplate osteophytes and bulging of the  disc but no stenosis.   L3-4: Endplate osteophytes and bulging of the disc. Facet  hypertrophy. Mild narrowing of the lateral recesses.   L4-5: Endplate osteophytes and bulging of the disc. Facet  hypertrophy. Stenosis of the lateral recesses.   L5-S1: Canal and foramina widely patent. Chronic disc space  narrowing.   IMPRESSION:  CT THORACIC SPINE IMPRESSION   Previously augmented fracture at T12. Loss of height of 40% at that  level. Retropulsion of 5-6 mm, encroaching upon the spinal canal. No  new fracture otherwise in the thoracic  region.   CT LUMBAR SPINE IMPRESSION   Newly seen superior endplate fracture at L1 without measurable loss  of height or retropulsed bone. This could contribute to regional  pain.   Chronic appearing degenerative disc disease and degenerative facet  disease in the lower lumbar spine without severe findings.   Electronically Signed   By: Nelson Chimes M.D.   On: 07/15/2019 19:28  Date: 07/15/19  Received From: [facility]   Impression: Closed compression fracture of body of L1 vertebra (CMS-HCC) [S32.010A] Closed compression fracture of body of L1 vertebra (CMS-HCC) (primary encounter diagnosis) Status post kyphoplasty T12 07/05/19  Plan:  1. Risks,  benefits, complications of an L1 kyphoplasty procedure have been discussed with the patient. Patient has agreed and consented the procedure with Dr. Hessie Knows. She is given prescription for tramadol and Zofran to take as needed.  This note was generated in part with voice recognition software and I apologize for any typographical errors that were not detected and corrected.  Feliberto Gottron MPA-C    Electronically signed by Feliberto Gottron, PA at 07/16/2019 11:15 AM EDT  Reviewed paper H+P, will be scanned into chart. No changes noted.

## 2019-07-22 NOTE — Discharge Instructions (Addendum)
Remove Band-Aids on Thursday then okay to shower. Take it easy today and tomorrow and Thursday try to resume more normal activities. Pain medicine as directed. Resume all normal medications today. Walk as much as you can for exercise over the next week. Call office if you have any sudden increase in pain.   AMBULATORY SURGERY  DISCHARGE INSTRUCTIONS   1) The drugs that you were given will stay in your system until tomorrow so for the next 24 hours you should not:  A) Drive an automobile B) Make any legal decisions C) Drink any alcoholic beverage   2) You may resume regular meals tomorrow.  Today it is better to start with liquids and gradually work up to solid foods.  You may eat anything you prefer, but it is better to start with liquids, then soup and crackers, and gradually work up to solid foods.   3) Please notify your doctor immediately if you have any unusual bleeding, trouble breathing, redness and pain at the surgery site, drainage, fever, or pain not relieved by medication.    4) Additional Instructions:        Please contact your physician with any problems or Same Day Surgery at [phone], Monday through Friday 6 am to 4 pm, or [place] at [facility] number at [phone].

## 2019-07-23 ENCOUNTER — Ambulatory Visit: Payer: PPO | Admitting: Anesthesiology

## 2019-07-23 ENCOUNTER — Ambulatory Visit
Admission: RE | Admit: 2019-07-23 | Discharge: 2019-07-23 | Disposition: A | Discharge status: Home / Self Care | Payer: PPO | Attending: Orthopedic Surgery | Admitting: Orthopedic Surgery

## 2019-07-23 ENCOUNTER — Ambulatory Visit: Payer: PPO

## 2019-07-23 ENCOUNTER — Other Ambulatory Visit: Payer: Self-pay

## 2019-07-23 ENCOUNTER — Encounter
Admission: RE | Disposition: A | Discharge status: Home / Self Care | Payer: Self-pay | Source: Home / Self Care | Attending: Orthopedic Surgery

## 2019-07-23 ENCOUNTER — Encounter: Payer: Self-pay | Admitting: Orthopedic Surgery

## 2019-07-23 DIAGNOSIS — Z79899 Other long term (current) drug therapy: Secondary | ICD-10-CM | POA: Diagnosis not present

## 2019-07-23 DIAGNOSIS — Z6837 Body mass index (BMI) 37.0-37.9, adult: Secondary | ICD-10-CM | POA: Insufficient documentation

## 2019-07-23 DIAGNOSIS — Z9889 Other specified postprocedural states: Secondary | ICD-10-CM | POA: Diagnosis not present

## 2019-07-23 DIAGNOSIS — M170 Bilateral primary osteoarthritis of knee: Secondary | ICD-10-CM | POA: Diagnosis not present

## 2019-07-23 DIAGNOSIS — G4730 Sleep apnea, unspecified: Secondary | ICD-10-CM | POA: Diagnosis not present

## 2019-07-23 DIAGNOSIS — J449 Chronic obstructive pulmonary disease, unspecified: Secondary | ICD-10-CM | POA: Diagnosis not present

## 2019-07-23 DIAGNOSIS — E785 Hyperlipidemia, unspecified: Secondary | ICD-10-CM | POA: Diagnosis not present

## 2019-07-23 DIAGNOSIS — S22080A Wedge compression fracture of T11-T12 vertebra, initial encounter for closed fracture: Secondary | ICD-10-CM | POA: Diagnosis not present

## 2019-07-23 DIAGNOSIS — Z7982 Long term (current) use of aspirin: Secondary | ICD-10-CM | POA: Diagnosis not present

## 2019-07-23 DIAGNOSIS — I872 Venous insufficiency (chronic) (peripheral): Secondary | ICD-10-CM | POA: Diagnosis not present

## 2019-07-23 DIAGNOSIS — M47816 Spondylosis without myelopathy or radiculopathy, lumbar region: Secondary | ICD-10-CM | POA: Diagnosis not present

## 2019-07-23 DIAGNOSIS — K219 Gastro-esophageal reflux disease without esophagitis: Secondary | ICD-10-CM | POA: Diagnosis not present

## 2019-07-23 DIAGNOSIS — Z7951 Long term (current) use of inhaled steroids: Secondary | ICD-10-CM | POA: Diagnosis not present

## 2019-07-23 DIAGNOSIS — M069 Rheumatoid arthritis, unspecified: Secondary | ICD-10-CM | POA: Diagnosis not present

## 2019-07-23 DIAGNOSIS — J4520 Mild intermittent asthma, uncomplicated: Secondary | ICD-10-CM | POA: Diagnosis not present

## 2019-07-23 DIAGNOSIS — X58XXXA Exposure to other specified factors, initial encounter: Secondary | ICD-10-CM | POA: Diagnosis not present

## 2019-07-23 DIAGNOSIS — Z981 Arthrodesis status: Secondary | ICD-10-CM | POA: Diagnosis not present

## 2019-07-23 DIAGNOSIS — S32010A Wedge compression fracture of first lumbar vertebra, initial encounter for closed fracture: Secondary | ICD-10-CM | POA: Diagnosis not present

## 2019-07-23 DIAGNOSIS — S22088D Other fracture of T11-T12 vertebra, subsequent encounter for fracture with routine healing: Secondary | ICD-10-CM | POA: Diagnosis not present

## 2019-07-23 DIAGNOSIS — Z419 Encounter for procedure for purposes other than remedying health state, unspecified: Secondary | ICD-10-CM

## 2019-07-23 HISTORY — PX: KYPHOPLASTY: SHX5884

## 2019-07-23 SURGERY — KYPHOPLASTY
Anesthesia: General | Site: Back

## 2019-07-23 MED ORDER — METOCLOPRAMIDE HCL 10 MG PO TABS
5.0000 mg | ORAL_TABLET | Freq: Three times a day (TID) | ORAL | Status: DC | PRN
Start: 2019-07-23 — End: 2019-07-24

## 2019-07-23 MED ORDER — METOCLOPRAMIDE HCL 5 MG/ML IJ SOLN: 5.0000 mg | Freq: Three times a day (TID) | INTRAMUSCULAR | Status: DC | PRN

## 2019-07-23 MED ORDER — LACTATED RINGERS IV SOLN: INTRAVENOUS | Status: DC

## 2019-07-23 MED ORDER — FENTANYL CITRATE (PF) 100 MCG/2ML IJ SOLN
INTRAMUSCULAR | Status: AC
  Filled 2019-07-23: qty 2

## 2019-07-23 MED ORDER — FENTANYL CITRATE (PF) 100 MCG/2ML IJ SOLN
INTRAMUSCULAR | Status: DC | PRN
  Administered 2019-07-23 (×2): 50 ug via INTRAVENOUS

## 2019-07-23 MED ORDER — BUPIVACAINE-EPINEPHRINE (PF) 0.5% -1:200000 IJ SOLN
INTRAMUSCULAR | Status: DC | PRN
  Administered 2019-07-23: 10 mL

## 2019-07-23 MED ORDER — DEXAMETHASONE SODIUM PHOSPHATE 10 MG/ML IJ SOLN
INTRAMUSCULAR | Status: AC
  Filled 2019-07-23: qty 1

## 2019-07-23 MED ORDER — SODIUM CHLORIDE 0.9 % IV SOLN: INTRAVENOUS | Status: DC

## 2019-07-23 MED ORDER — SCOPOLAMINE 1 MG/3DAYS TD PT72
MEDICATED_PATCH | TRANSDERMAL | Status: AC
  Filled 2019-07-23: qty 1

## 2019-07-23 MED ORDER — ONDANSETRON HCL 4 MG/2ML IJ SOLN
INTRAMUSCULAR | Status: DC | PRN
  Administered 2019-07-23 (×2): 4 mg via INTRAVENOUS

## 2019-07-23 MED ORDER — FENTANYL CITRATE (PF) 100 MCG/2ML IJ SOLN
25.0000 ug | INTRAMUSCULAR | Status: DC | PRN
  Administered 2019-07-23: 25 ug via INTRAVENOUS

## 2019-07-23 MED ORDER — SCOPOLAMINE 1 MG/3DAYS TD PT72
MEDICATED_PATCH | TRANSDERMAL | Status: DC | PRN
  Administered 2019-07-23: 1 via TRANSDERMAL

## 2019-07-23 MED ORDER — DEXAMETHASONE SODIUM PHOSPHATE 10 MG/ML IJ SOLN
INTRAMUSCULAR | Status: DC | PRN
  Administered 2019-07-23: 4 mg via INTRAVENOUS

## 2019-07-23 MED ORDER — ONDANSETRON HCL 4 MG/2ML IJ SOLN
INTRAMUSCULAR | Status: AC
  Filled 2019-07-23: qty 2

## 2019-07-23 MED ORDER — ONDANSETRON HCL 4 MG PO TABS: 4.0000 mg | ORAL_TABLET | Freq: Four times a day (QID) | ORAL | Status: DC | PRN

## 2019-07-23 MED ORDER — ONDANSETRON HCL 4 MG/2ML IJ SOLN: 4.0000 mg | Freq: Four times a day (QID) | INTRAMUSCULAR | Status: DC | PRN

## 2019-07-23 MED ORDER — LIDOCAINE HCL 1 % IJ SOLN
INTRAMUSCULAR | Status: DC | PRN
  Administered 2019-07-23: 20 mL

## 2019-07-23 MED ORDER — PROPOFOL 500 MG/50ML IV EMUL
INTRAVENOUS | Status: DC | PRN
  Administered 2019-07-23: 50 ug/kg/min via INTRAVENOUS

## 2019-07-23 SURGICAL SUPPLY — 20 items
CEMENT KYPHON CX01A KIT/MIXER (Cement) ×3 IMPLANT
COVER WAND RF STERILE (DRAPES) ×3 IMPLANT
DERMABOND ADVANCED (GAUZE/BANDAGES/DRESSINGS) ×2
DERMABOND ADVANCED .7 DNX12 (GAUZE/BANDAGES/DRESSINGS) ×1 IMPLANT
DEVICE BIOPSY BONE KYPHX (INSTRUMENTS) ×3 IMPLANT
DRAPE C-ARM XRAY 36X54 (DRAPES) ×3 IMPLANT
DURAPREP 26ML APPLICATOR (WOUND CARE) ×3 IMPLANT
GLOVE SURG SYN 9.0  PF PI (GLOVE) ×2
GLOVE SURG SYN 9.0 PF PI (GLOVE) ×1 IMPLANT
GOWN SRG 2XL LVL 4 RGLN SLV (GOWNS) ×1 IMPLANT
GOWN STRL NON-REIN 2XL LVL4 (GOWNS) ×2
GOWN STRL REUS W/ TWL LRG LVL3 (GOWN DISPOSABLE) ×1 IMPLANT
GOWN STRL REUS W/TWL LRG LVL3 (GOWN DISPOSABLE) ×2
PACK KYPHOPLASTY (MISCELLANEOUS) ×3 IMPLANT
RENTAL RFA  GENERATOR (MISCELLANEOUS)
RENTAL RFA GENERATOR (MISCELLANEOUS) IMPLANT
STRAP SAFETY 5IN WIDE (MISCELLANEOUS) ×3 IMPLANT
SWABSTK COMLB BENZOIN TINCTURE (MISCELLANEOUS) ×3 IMPLANT
TRAY KYPHOPAK 15/3 EXPRESS 1ST (MISCELLANEOUS) ×3 IMPLANT
TRAY KYPHOPAK 20/3 EXPRESS 1ST (MISCELLANEOUS) IMPLANT

## 2019-07-23 NOTE — Anesthesia Postprocedure Evaluation (Signed)
Anesthesia Post Note  Patient: Amber Kirk  Procedure(s) Performed: L1 KYPHOPLASTY (N/A Back)  Patient location during evaluation: PACU Anesthesia Type: General Level of consciousness: awake and alert Pain management: pain level controlled Vital Signs Assessment: post-procedure vital signs reviewed and stable Respiratory status: spontaneous breathing, nonlabored ventilation, respiratory function stable and patient connected to nasal cannula oxygen Cardiovascular status: blood pressure returned to baseline and stable Postop Assessment: no apparent nausea or vomiting Anesthetic complications: no     Last Vitals:  Vitals:   07/23/19 1827 07/23/19 1838  BP: (!) 132/50   Pulse: 98 96  Resp: 20 20  Temp:  36.4 C  SpO2: 97% 99%    Last Pain:  Vitals:   07/23/19 1838  TempSrc: Temporal  PainSc: 3                  Precious Haws [place]it[place]

## 2019-07-23 NOTE — Transfer of Care (Signed)
Immediate Anesthesia Transfer of Care Note  Patient: Amber Kirk  Procedure(s) Performed: L1 KYPHOPLASTY (N/A Back)  Patient Location: PACU  Anesthesia Type:General  Level of Consciousness: awake, alert  and oriented  Airway & Oxygen Therapy: Patient Spontanous Breathing and Patient connected to nasal cannula oxygen  Post-op Assessment: Report given to RN  Post vital signs: stable  Last Vitals:  Vitals Value Taken Time  BP 140/53 07/23/19 1727  Temp    Pulse 96 07/23/19 1728  Resp 21 07/23/19 1728  SpO2 100 % 07/23/19 1728  Vitals shown include unvalidated device data.  Last Pain:  Vitals:   07/23/19 1616  TempSrc: Tympanic  PainSc: 0-No pain      Patients Stated Pain Goal: 0 ([id number])  Complications: No apparent anesthesia complications

## 2019-07-23 NOTE — Op Note (Signed)
Date 07/23/19  Time 5:30 PM   PATIENT:  Amber Kirk   PRE-OPERATIVE DIAGNOSIS:  closed wedge compression fracture of L1   POST-OPERATIVE DIAGNOSIS:  closed wedge compression fracture of L1   PROCEDURE:  Procedure(s): KYPHOPLASTY L1  SURGEON: Laurene Footman, MD   ASSISTANTS: None   ANESTHESIA:   local and MAC   EBL:  No intake/output data recorded.   BLOOD ADMINISTERED:none   DRAINS: none    LOCAL MEDICATIONS USED:  MARCAINE    and XYLOCAINE    SPECIMEN:   L1 vertebral body biopsy   DISPOSITION OF SPECIMEN:  Pathology   COUNTS:  YES   TOURNIQUET:  * No tourniquets in log *   IMPLANTS: Bone cement   DICTATION: .Dragon Dictation  patient was brought to the operating room and after adequate anesthesia was obtained the patient was placed prone.  C arm was brought in in good visualization of the affected level obtained on both AP and lateral projections.  After patient identification and timeout procedures were completed, local anesthetic was infiltrated with 10 cc 1% Xylocaine infiltrated subcutaneously.  This is done the area on the right side of the planned approach.  The back was then prepped and draped in the usual sterile manner and repeat timeout procedure carried out.  A spinal needle was brought down to the pedicle on the right side of  L1 and a 50-50 mix of 1% Xylocaine half percent Sensorcaine with epinephrine total of 20 cc injected.  After allowing this to set a small incision was made and the trocar was advanced into the vertebral body in an extrapedicular fashion.  Biopsy was obtained Drilling was carried out balloon inserted with inflation to  3-1/2 cc.  When the cement was appropriate consistency 5-1/2 cc were injected into the vertebral body with a small amount of extravasation to the right without going beyond the vein, good fill superior to inferior endplates and from right to left sides along the inferior endplate.  After the cement had set the trochar was  removed and permanent C-arm views obtained.  The wound was closed with Dermabond followed by Band-Aid   PLAN OF CARE: Discharge to home after PACU   PATIENT DISPOSITION:  PACU - hemodynamically stable.

## 2019-07-23 NOTE — Anesthesia Preprocedure Evaluation (Signed)
Anesthesia Evaluation  Patient identified by MRN, date of birth, ID band Patient awake    Reviewed: Allergy & Precautions, H&P , NPO status , Patient's Chart, lab work & pertinent test results, reviewed documented beta blocker date and time   History of Anesthesia Complications (+) PONV and history of anesthetic complications  Airway Mallampati: IV  TM Distance: <3 FB Neck ROM: limited    Dental  (+) Dental Advidsory Given, Caps, Poor Dentition, Chipped   Pulmonary neg shortness of breath, asthma , sleep apnea , COPD, neg recent URI,    Pulmonary exam normal        Cardiovascular Exercise Tolerance: Good (-) hypertension(-) angina(-) Past MI and (-) Cardiac Stents Normal cardiovascular exam(-) dysrhythmias + Valvular Problems/Murmurs      Neuro/Psych negative neurological ROS  negative psych ROS   GI/Hepatic Neg liver ROS, GERD  Controlled and Medicated,  Endo/Other  neg diabetesMorbid obesity  Renal/GU negative Renal ROS  negative genitourinary   Musculoskeletal   Abdominal   Peds  Hematology negative hematology ROS (+)   Anesthesia Other Findings Past Medical History: No date: Allergy No date: Asthma No date: Complication of anesthesia No date: COPD (chronic obstructive pulmonary disease) (HCC) No date: GERD (gastroesophageal reflux disease) No date: Heart murmur No date: Interstitial cystitis No date: PONV (postoperative nausea and vomiting)     Comment:  after 1st cataract surgery No date: Sleep apnea     Comment:  mild-no cpap    Reproductive/Obstetrics negative OB ROS                             Anesthesia Physical  Anesthesia Plan  ASA: III  Anesthesia Plan: General   Post-op Pain Management:    Induction: Intravenous  PONV Risk Score and Plan: 4 or greater and Propofol infusion and TIVA  Airway Management Planned: Natural Airway and Simple Face Mask  Additional  Equipment:   Intra-op Plan:   Post-operative Plan:   Informed Consent: I have reviewed the patients History and Physical, chart, labs and discussed the procedure including the risks, benefits and alternatives for the proposed anesthesia with the patient or authorized representative who has indicated his/her understanding and acceptance.     Dental Advisory Given  Plan Discussed with: Anesthesiologist, CRNA and Surgeon  Anesthesia Plan Comments: (Patient consented for risks of anesthesia including but not limited to:  - adverse reactions to medications - risk of intubation if required - damage to eyes, teeth, lips or other oral mucosa - nerve damage due to positioning  - sore throat or hoarseness - Damage to heart, brain, nerves, lungs or loss of life  Patient voiced understanding.)        Anesthesia Quick Evaluation

## 2019-07-25 LAB — SURGICAL PATHOLOGY

## 2019-07-26 DIAGNOSIS — K219 Gastro-esophageal reflux disease without esophagitis: Secondary | ICD-10-CM | POA: Diagnosis not present

## 2019-07-26 DIAGNOSIS — Z9889 Other specified postprocedural states: Secondary | ICD-10-CM | POA: Diagnosis not present

## 2019-07-26 DIAGNOSIS — N3010 Interstitial cystitis (chronic) without hematuria: Secondary | ICD-10-CM | POA: Diagnosis not present

## 2019-07-26 DIAGNOSIS — J4520 Mild intermittent asthma, uncomplicated: Secondary | ICD-10-CM | POA: Diagnosis not present

## 2019-07-26 DIAGNOSIS — R8290 Unspecified abnormal findings in urine: Secondary | ICD-10-CM | POA: Diagnosis not present

## 2019-07-26 DIAGNOSIS — M159 Polyosteoarthritis, unspecified: Secondary | ICD-10-CM | POA: Diagnosis not present

## 2019-07-26 DIAGNOSIS — R399 Unspecified symptoms and signs involving the genitourinary system: Secondary | ICD-10-CM | POA: Diagnosis not present

## 2019-07-26 DIAGNOSIS — Z6837 Body mass index (BMI) 37.0-37.9, adult: Secondary | ICD-10-CM | POA: Diagnosis not present

## 2019-07-26 DIAGNOSIS — F3340 Major depressive disorder, recurrent, in remission, unspecified: Secondary | ICD-10-CM | POA: Diagnosis not present

## 2019-07-26 DIAGNOSIS — N343 Urethral syndrome, unspecified: Secondary | ICD-10-CM | POA: Diagnosis not present

## 2019-07-29 DIAGNOSIS — M069 Rheumatoid arthritis, unspecified: Secondary | ICD-10-CM | POA: Diagnosis not present

## 2019-07-29 DIAGNOSIS — G8929 Other chronic pain: Secondary | ICD-10-CM | POA: Diagnosis not present

## 2019-07-29 DIAGNOSIS — J449 Chronic obstructive pulmonary disease, unspecified: Secondary | ICD-10-CM | POA: Diagnosis not present

## 2019-07-29 DIAGNOSIS — F329 Major depressive disorder, single episode, unspecified: Secondary | ICD-10-CM | POA: Diagnosis not present

## 2019-07-29 DIAGNOSIS — Z9181 History of falling: Secondary | ICD-10-CM | POA: Diagnosis not present

## 2019-07-29 DIAGNOSIS — J4520 Mild intermittent asthma, uncomplicated: Secondary | ICD-10-CM | POA: Diagnosis not present

## 2019-07-29 DIAGNOSIS — M5410 Radiculopathy, site unspecified: Secondary | ICD-10-CM | POA: Diagnosis not present

## 2019-07-29 DIAGNOSIS — R32 Unspecified urinary incontinence: Secondary | ICD-10-CM | POA: Diagnosis not present

## 2019-07-29 DIAGNOSIS — M170 Bilateral primary osteoarthritis of knee: Secondary | ICD-10-CM | POA: Diagnosis not present

## 2019-07-29 DIAGNOSIS — E785 Hyperlipidemia, unspecified: Secondary | ICD-10-CM | POA: Diagnosis not present

## 2019-07-29 DIAGNOSIS — Z7951 Long term (current) use of inhaled steroids: Secondary | ICD-10-CM | POA: Diagnosis not present

## 2019-07-29 DIAGNOSIS — S22088D Other fracture of T11-T12 vertebra, subsequent encounter for fracture with routine healing: Secondary | ICD-10-CM | POA: Diagnosis not present

## 2019-07-29 DIAGNOSIS — I872 Venous insufficiency (chronic) (peripheral): Secondary | ICD-10-CM | POA: Diagnosis not present

## 2019-07-29 DIAGNOSIS — K219 Gastro-esophageal reflux disease without esophagitis: Secondary | ICD-10-CM | POA: Diagnosis not present

## 2019-07-29 DIAGNOSIS — M47816 Spondylosis without myelopathy or radiculopathy, lumbar region: Secondary | ICD-10-CM | POA: Diagnosis not present

## 2019-07-29 DIAGNOSIS — F419 Anxiety disorder, unspecified: Secondary | ICD-10-CM | POA: Diagnosis not present

## 2019-07-29 DIAGNOSIS — Z6841 Body Mass Index (BMI) 40.0 and over, adult: Secondary | ICD-10-CM | POA: Diagnosis not present

## 2019-07-29 DIAGNOSIS — Z7982 Long term (current) use of aspirin: Secondary | ICD-10-CM | POA: Diagnosis not present

## 2019-07-30 DIAGNOSIS — S22088D Other fracture of T11-T12 vertebra, subsequent encounter for fracture with routine healing: Secondary | ICD-10-CM | POA: Diagnosis not present

## 2019-07-30 DIAGNOSIS — K219 Gastro-esophageal reflux disease without esophagitis: Secondary | ICD-10-CM | POA: Diagnosis not present

## 2019-07-30 DIAGNOSIS — M47816 Spondylosis without myelopathy or radiculopathy, lumbar region: Secondary | ICD-10-CM | POA: Diagnosis not present

## 2019-07-30 DIAGNOSIS — Z7982 Long term (current) use of aspirin: Secondary | ICD-10-CM | POA: Diagnosis not present

## 2019-07-30 DIAGNOSIS — M5410 Radiculopathy, site unspecified: Secondary | ICD-10-CM | POA: Diagnosis not present

## 2019-07-30 DIAGNOSIS — G8929 Other chronic pain: Secondary | ICD-10-CM | POA: Diagnosis not present

## 2019-07-30 DIAGNOSIS — R32 Unspecified urinary incontinence: Secondary | ICD-10-CM | POA: Diagnosis not present

## 2019-07-30 DIAGNOSIS — Z7951 Long term (current) use of inhaled steroids: Secondary | ICD-10-CM | POA: Diagnosis not present

## 2019-07-30 DIAGNOSIS — Z9889 Other specified postprocedural states: Secondary | ICD-10-CM | POA: Diagnosis not present

## 2019-07-30 DIAGNOSIS — I872 Venous insufficiency (chronic) (peripheral): Secondary | ICD-10-CM | POA: Diagnosis not present

## 2019-07-30 DIAGNOSIS — S32030A Wedge compression fracture of third lumbar vertebra, initial encounter for closed fracture: Secondary | ICD-10-CM | POA: Diagnosis not present

## 2019-07-30 DIAGNOSIS — Z9181 History of falling: Secondary | ICD-10-CM | POA: Diagnosis not present

## 2019-07-30 DIAGNOSIS — J4520 Mild intermittent asthma, uncomplicated: Secondary | ICD-10-CM | POA: Diagnosis not present

## 2019-07-30 DIAGNOSIS — J449 Chronic obstructive pulmonary disease, unspecified: Secondary | ICD-10-CM | POA: Diagnosis not present

## 2019-07-30 DIAGNOSIS — E785 Hyperlipidemia, unspecified: Secondary | ICD-10-CM | POA: Diagnosis not present

## 2019-07-30 DIAGNOSIS — M069 Rheumatoid arthritis, unspecified: Secondary | ICD-10-CM | POA: Diagnosis not present

## 2019-07-30 DIAGNOSIS — F419 Anxiety disorder, unspecified: Secondary | ICD-10-CM | POA: Diagnosis not present

## 2019-07-30 DIAGNOSIS — M170 Bilateral primary osteoarthritis of knee: Secondary | ICD-10-CM | POA: Diagnosis not present

## 2019-07-30 DIAGNOSIS — Z6841 Body Mass Index (BMI) 40.0 and over, adult: Secondary | ICD-10-CM | POA: Diagnosis not present

## 2019-07-30 DIAGNOSIS — F329 Major depressive disorder, single episode, unspecified: Secondary | ICD-10-CM | POA: Diagnosis not present

## 2019-07-31 ENCOUNTER — Other Ambulatory Visit: Payer: Self-pay | Admitting: Orthopedic Surgery

## 2019-07-31 DIAGNOSIS — S32030A Wedge compression fracture of third lumbar vertebra, initial encounter for closed fracture: Secondary | ICD-10-CM

## 2019-08-01 ENCOUNTER — Encounter
Admission: RE | Admit: 2019-08-01 | Discharge: 2019-08-01 | Disposition: A | Discharge status: Home / Self Care | Payer: PPO | Source: Ambulatory Visit | Attending: Orthopedic Surgery | Admitting: Orthopedic Surgery

## 2019-08-01 ENCOUNTER — Other Ambulatory Visit: Payer: Self-pay

## 2019-08-01 DIAGNOSIS — S32030A Wedge compression fracture of third lumbar vertebra, initial encounter for closed fracture: Secondary | ICD-10-CM | POA: Diagnosis not present

## 2019-08-01 DIAGNOSIS — S32010A Wedge compression fracture of first lumbar vertebra, initial encounter for closed fracture: Secondary | ICD-10-CM | POA: Diagnosis not present

## 2019-08-01 MED ORDER — TECHNETIUM TC 99M MEDRONATE IV KIT
20.0000 | PACK | Freq: Once | INTRAVENOUS | Status: AC | PRN
  Administered 2019-08-01: 20.48 via INTRAVENOUS

## 2019-08-05 DIAGNOSIS — M069 Rheumatoid arthritis, unspecified: Secondary | ICD-10-CM | POA: Diagnosis not present

## 2019-08-05 DIAGNOSIS — K219 Gastro-esophageal reflux disease without esophagitis: Secondary | ICD-10-CM | POA: Diagnosis not present

## 2019-08-05 DIAGNOSIS — R32 Unspecified urinary incontinence: Secondary | ICD-10-CM | POA: Diagnosis not present

## 2019-08-05 DIAGNOSIS — S22088D Other fracture of T11-T12 vertebra, subsequent encounter for fracture with routine healing: Secondary | ICD-10-CM | POA: Diagnosis not present

## 2019-08-05 DIAGNOSIS — M5410 Radiculopathy, site unspecified: Secondary | ICD-10-CM | POA: Diagnosis not present

## 2019-08-05 DIAGNOSIS — G8929 Other chronic pain: Secondary | ICD-10-CM | POA: Diagnosis not present

## 2019-08-05 DIAGNOSIS — Z7982 Long term (current) use of aspirin: Secondary | ICD-10-CM | POA: Diagnosis not present

## 2019-08-05 DIAGNOSIS — E785 Hyperlipidemia, unspecified: Secondary | ICD-10-CM | POA: Diagnosis not present

## 2019-08-05 DIAGNOSIS — Z7951 Long term (current) use of inhaled steroids: Secondary | ICD-10-CM | POA: Diagnosis not present

## 2019-08-05 DIAGNOSIS — Z9181 History of falling: Secondary | ICD-10-CM | POA: Diagnosis not present

## 2019-08-05 DIAGNOSIS — J449 Chronic obstructive pulmonary disease, unspecified: Secondary | ICD-10-CM | POA: Diagnosis not present

## 2019-08-05 DIAGNOSIS — Z6841 Body Mass Index (BMI) 40.0 and over, adult: Secondary | ICD-10-CM | POA: Diagnosis not present

## 2019-08-05 DIAGNOSIS — M170 Bilateral primary osteoarthritis of knee: Secondary | ICD-10-CM | POA: Diagnosis not present

## 2019-08-05 DIAGNOSIS — M47816 Spondylosis without myelopathy or radiculopathy, lumbar region: Secondary | ICD-10-CM | POA: Diagnosis not present

## 2019-08-05 DIAGNOSIS — J4520 Mild intermittent asthma, uncomplicated: Secondary | ICD-10-CM | POA: Diagnosis not present

## 2019-08-05 DIAGNOSIS — I872 Venous insufficiency (chronic) (peripheral): Secondary | ICD-10-CM | POA: Diagnosis not present

## 2019-08-05 DIAGNOSIS — F419 Anxiety disorder, unspecified: Secondary | ICD-10-CM | POA: Diagnosis not present

## 2019-08-05 DIAGNOSIS — F329 Major depressive disorder, single episode, unspecified: Secondary | ICD-10-CM | POA: Diagnosis not present

## 2019-08-07 DIAGNOSIS — K219 Gastro-esophageal reflux disease without esophagitis: Secondary | ICD-10-CM | POA: Diagnosis not present

## 2019-08-07 DIAGNOSIS — Z7982 Long term (current) use of aspirin: Secondary | ICD-10-CM | POA: Diagnosis not present

## 2019-08-07 DIAGNOSIS — M5410 Radiculopathy, site unspecified: Secondary | ICD-10-CM | POA: Diagnosis not present

## 2019-08-07 DIAGNOSIS — Z9181 History of falling: Secondary | ICD-10-CM | POA: Diagnosis not present

## 2019-08-07 DIAGNOSIS — E785 Hyperlipidemia, unspecified: Secondary | ICD-10-CM | POA: Diagnosis not present

## 2019-08-07 DIAGNOSIS — F419 Anxiety disorder, unspecified: Secondary | ICD-10-CM | POA: Diagnosis not present

## 2019-08-07 DIAGNOSIS — M170 Bilateral primary osteoarthritis of knee: Secondary | ICD-10-CM | POA: Diagnosis not present

## 2019-08-07 DIAGNOSIS — R32 Unspecified urinary incontinence: Secondary | ICD-10-CM | POA: Diagnosis not present

## 2019-08-07 DIAGNOSIS — S22088D Other fracture of T11-T12 vertebra, subsequent encounter for fracture with routine healing: Secondary | ICD-10-CM | POA: Diagnosis not present

## 2019-08-07 DIAGNOSIS — J4520 Mild intermittent asthma, uncomplicated: Secondary | ICD-10-CM | POA: Diagnosis not present

## 2019-08-07 DIAGNOSIS — Z780 Asymptomatic menopausal state: Secondary | ICD-10-CM | POA: Diagnosis not present

## 2019-08-07 DIAGNOSIS — Z6841 Body Mass Index (BMI) 40.0 and over, adult: Secondary | ICD-10-CM | POA: Diagnosis not present

## 2019-08-07 DIAGNOSIS — G8929 Other chronic pain: Secondary | ICD-10-CM | POA: Diagnosis not present

## 2019-08-07 DIAGNOSIS — M47816 Spondylosis without myelopathy or radiculopathy, lumbar region: Secondary | ICD-10-CM | POA: Diagnosis not present

## 2019-08-07 DIAGNOSIS — J449 Chronic obstructive pulmonary disease, unspecified: Secondary | ICD-10-CM | POA: Diagnosis not present

## 2019-08-07 DIAGNOSIS — I872 Venous insufficiency (chronic) (peripheral): Secondary | ICD-10-CM | POA: Diagnosis not present

## 2019-08-07 DIAGNOSIS — F329 Major depressive disorder, single episode, unspecified: Secondary | ICD-10-CM | POA: Diagnosis not present

## 2019-08-07 DIAGNOSIS — M069 Rheumatoid arthritis, unspecified: Secondary | ICD-10-CM | POA: Diagnosis not present

## 2019-08-07 DIAGNOSIS — Z7951 Long term (current) use of inhaled steroids: Secondary | ICD-10-CM | POA: Diagnosis not present

## 2019-08-08 ENCOUNTER — Encounter: Payer: Self-pay | Admitting: Emergency Medicine

## 2019-08-08 ENCOUNTER — Ambulatory Visit
Admission: EM | Admit: 2019-08-08 | Discharge: 2019-08-08 | Disposition: A | Discharge status: Home / Self Care | Payer: PPO | Attending: Urgent Care | Admitting: Urgent Care

## 2019-08-08 ENCOUNTER — Other Ambulatory Visit: Payer: Self-pay

## 2019-08-08 DIAGNOSIS — N390 Urinary tract infection, site not specified: Secondary | ICD-10-CM

## 2019-08-08 LAB — URINALYSIS, COMPLETE (UACMP) WITH MICROSCOPIC
Bacteria, UA: NONE SEEN
Bilirubin Urine: NEGATIVE
Glucose, UA: NEGATIVE mg/dL
Hgb urine dipstick: NEGATIVE
Ketones, ur: NEGATIVE mg/dL
Nitrite: NEGATIVE
Protein, ur: NEGATIVE mg/dL
RBC / HPF: NONE SEEN RBC/hpf (ref 0–5)
Specific Gravity, Urine: 1.015 (ref 1.005–1.030)
pH: 5.5 (ref 5.0–8.0)

## 2019-08-08 MED ORDER — CEPHALEXIN 500 MG PO CAPS
500.0000 mg | ORAL_CAPSULE | Freq: Two times a day (BID) | ORAL | 0 refills | Status: AC
Start: 2019-08-08 — End: 2019-08-15

## 2019-08-08 NOTE — ED Triage Notes (Signed)
Patient in today c/o urinary frequency and pain since last night. Patient took an OTC urine test and states it was positive for UTI.

## 2019-08-08 NOTE — Discharge Instructions (Signed)
It was very nice seeing you today in clinic. Thank you for entrusting me with your care.   As discussed, your urine is POSITIVE for an early infection. Given your history for recurrent infections, will approach treatment as follows:  Prescription has been sent to your pharmacy for antibiotics.  Please pick up and take as directed. FINISH the entire course of medication even if you are feeling better.  A culture will be sent on your provided sample. If it comes back resistant to what I have prescribed you, someone will call you and let you know that we will need to change antibiotics. Increase fluid intake as much as possible to flush your urinary tract.  Water is always the best.  Avoid caffeine until your infection clears up, as it can contribute to painful bladder spasms.  May use Tylenol and/or Ibuprofen as needed for pain/fever.  Make arrangements to follow up with your regular doctor in 1 week for re-evaluation. If your symptoms/condition worsens, please seek follow up care either here or in the ER. Please remember, our [place] providers are "right here with you" when you need [name].   Again, it was my pleasure to take care of you today. Thank you for choosing our clinic. I hope that you start to feel better quickly.   Honor Loh, MSN, APRN, FNP-C, CEN Advanced Practice Provider [facility]

## 2019-08-09 DIAGNOSIS — M47816 Spondylosis without myelopathy or radiculopathy, lumbar region: Secondary | ICD-10-CM | POA: Diagnosis not present

## 2019-08-09 DIAGNOSIS — Z9181 History of falling: Secondary | ICD-10-CM | POA: Diagnosis not present

## 2019-08-09 DIAGNOSIS — S22088D Other fracture of T11-T12 vertebra, subsequent encounter for fracture with routine healing: Secondary | ICD-10-CM | POA: Diagnosis not present

## 2019-08-09 DIAGNOSIS — M069 Rheumatoid arthritis, unspecified: Secondary | ICD-10-CM | POA: Diagnosis not present

## 2019-08-09 DIAGNOSIS — Z7982 Long term (current) use of aspirin: Secondary | ICD-10-CM | POA: Diagnosis not present

## 2019-08-09 DIAGNOSIS — E785 Hyperlipidemia, unspecified: Secondary | ICD-10-CM | POA: Diagnosis not present

## 2019-08-09 DIAGNOSIS — Z6841 Body Mass Index (BMI) 40.0 and over, adult: Secondary | ICD-10-CM | POA: Diagnosis not present

## 2019-08-09 DIAGNOSIS — M170 Bilateral primary osteoarthritis of knee: Secondary | ICD-10-CM | POA: Diagnosis not present

## 2019-08-09 DIAGNOSIS — J449 Chronic obstructive pulmonary disease, unspecified: Secondary | ICD-10-CM | POA: Diagnosis not present

## 2019-08-09 DIAGNOSIS — R32 Unspecified urinary incontinence: Secondary | ICD-10-CM | POA: Diagnosis not present

## 2019-08-09 DIAGNOSIS — M5410 Radiculopathy, site unspecified: Secondary | ICD-10-CM | POA: Diagnosis not present

## 2019-08-09 DIAGNOSIS — G8929 Other chronic pain: Secondary | ICD-10-CM | POA: Diagnosis not present

## 2019-08-09 DIAGNOSIS — Z7951 Long term (current) use of inhaled steroids: Secondary | ICD-10-CM | POA: Diagnosis not present

## 2019-08-09 DIAGNOSIS — K219 Gastro-esophageal reflux disease without esophagitis: Secondary | ICD-10-CM | POA: Diagnosis not present

## 2019-08-09 DIAGNOSIS — F419 Anxiety disorder, unspecified: Secondary | ICD-10-CM | POA: Diagnosis not present

## 2019-08-09 DIAGNOSIS — J4520 Mild intermittent asthma, uncomplicated: Secondary | ICD-10-CM | POA: Diagnosis not present

## 2019-08-09 DIAGNOSIS — F329 Major depressive disorder, single episode, unspecified: Secondary | ICD-10-CM | POA: Diagnosis not present

## 2019-08-09 DIAGNOSIS — I872 Venous insufficiency (chronic) (peripheral): Secondary | ICD-10-CM | POA: Diagnosis not present

## 2019-08-09 LAB — URINE CULTURE: Culture: <10000 — AB

## 2019-08-09 NOTE — ED Provider Notes (Signed)
[place], [place]   Name: Amber Kirk DOB: 01-31-1935 MRN: [id number] CSN: [id number] PCP: [name], MD  Arrival date and time:  08/08/19 1117  Chief Complaint:  Urinary Frequency  NOTE: Prior to seeing the patient today, I have reviewed the triage nursing documentation and vital signs. Clinical staff has updated patient's PMH/PSHx, current medication list, and drug allergies/intolerances to ensure comprehensive history available to assist in medical decision making.   History:   HPI: Amber Kirk is a 85 y.o. female who presents today with complaints of urinary symptoms that began with acute onset last night. She complains of dysuria, frequency, urgency, and urinary incontinence. PMH (+) for interstitial cystitis, however she notes that her current symptom constellation is different. She has not appreciated any gross hematuria, nor has she noticed her urine being malodorous. Patient denies any associated nausea, vomiting, fever, or chills. She has not experienced any pain in her lower back (above baseline chronic pain), flank area, or abdomen. Patient advises that she has a past medical history that is significant for recurrent urinary tract infections. Patient reporting that she took a home urine test that showed (+) for infection, thus she presents today for treatment. She denies any vaginal pain, bleeding, or discharge. Despite her symptoms, patient has not taken any over the counter interventions to help improve/relieve her reported symptoms at home.   Past Medical History:  Diagnosis Date  . Allergy   . Asthma   . Bowel obstruction ([place]) 06/2019  . Complication of anesthesia   . COPD (chronic obstructive pulmonary disease) ([place])   . GERD (gastroesophageal reflux disease)   . Heart murmur   . Interstitial cystitis   . PONV (postoperative nausea and vomiting)    after 1st cataract surgery  . Sleep apnea    mild-no cpap     Past Surgical History:    Procedure Laterality Date  . ABDOMINAL HYSTERECTOMY    . APPENDECTOMY    . BLADDER SURGERY    . CHOLECYSTECTOMY    . EYE SURGERY     bil cataracts  . KYPHOPLASTY N/A 07/05/2019   Procedure: T12 KYPHOPLASTY;  Surgeon: Hessie Knows, MD;  Location: [facility] ORS;  Service: Orthopedics;  Laterality: N/A;  . KYPHOPLASTY N/A 07/23/2019   Procedure: L1 KYPHOPLASTY;  Surgeon: Hessie Knows, MD;  Location: [facility] ORS;  Service: Orthopedics;  Laterality: N/A;  . TONSILLECTOMY      Family History  Problem Relation Age of Onset  . Heart disease Mother   . Stroke Father   . Breast cancer Neg Hx     Social History   Tobacco Use  . Smoking status: Never Smoker  . Smokeless tobacco: Never Used  Substance Use Topics  . Alcohol use: No  . Drug use: No    Patient Active Problem List   Diagnosis Date Noted  . Ileus ([place])   . Constipation   . Compression fracture of T12 vertebra ([facility])   . Colitis 06/27/2019  . Chronic venous insufficiency 07/04/2016  . Lymphedema 07/04/2016  . Asthma 07/04/2016  . Hyperlipidemia 07/04/2016    Home Medications:    Current Meds  Medication Sig  . acetaminophen (TYLENOL) 500 MG tablet Take 1,000 mg by mouth every 6 (six) hours as needed.  Marland Kitchen albuterol (VENTOLIN HFA) 108 (90 Base) MCG/ACT inhaler Inhale 1-2 puffs into the lungs every 6 (six) hours as needed for wheezing or shortness of breath.  . Aloe Vera Freeze Dried POWD Take 3 capsules by mouth in the  morning and at bedtime.  Marland Kitchen aspirin EC 81 MG tablet Take 81 mg by mouth daily.   Marland Kitchen azelastine (ASTELIN) 0.1 % nasal spray Place 1 spray into both nostrils 2 (two) times daily. Use in each nostril as directed  . Calcium Carbonate (CALCIUM 500 PO) Take 500 mg by mouth daily.  . cetirizine (ZYRTEC) 10 MG tablet Take 10 mg by mouth every morning.   . cholecalciferol (VITAMIN D) 25 MCG (1000 UNIT) tablet Take 1,000 Units by mouth daily.  Marland Kitchen docusate sodium (COLACE) 100 MG capsule Take 1 capsule (100 mg total) by  mouth 2 (two) times daily.  Marland Kitchen doxepin (SINEQUAN) 25 MG capsule Take 25 mg by mouth at bedtime.   . fluticasone (FLONASE) 50 MCG/ACT nasal spray Place 1 spray into both nostrils in the morning and at bedtime.   . hydroxychloroquine (PLAQUENIL) 200 MG tablet Take 100 mg by mouth daily.   Marland Kitchen LINZESS 145 MCG CAPS capsule Take 145 mcg by mouth daily as needed (constipation.).   Marland Kitchen lovastatin (MEVACOR) 20 MG tablet Take 20 mg by mouth daily with supper.   . montelukast (SINGULAIR) 10 MG tablet Take 10 mg by mouth every evening.   . Multiple Vitamins-Minerals (ADULT GUMMY PO) Take 2 tablets by mouth daily. Vitafusion Women's  . olopatadine (PATADAY) 0.1 % ophthalmic solution Place 1 drop into both eyes 2 (two) times daily as needed for allergies.   Marland Kitchen omeprazole (PRILOSEC) 20 MG capsule Take 20 mg by mouth in the morning and at bedtime.   . ondansetron (ZOFRAN-ODT) 4 MG disintegrating tablet Take 4 mg by mouth every 8 (eight) hours as needed for nausea/vomiting.  . polyethylene glycol (MIRALAX / GLYCOLAX) 17 g packet Take 17 g by mouth 2 (two) times daily.  . predniSONE (DELTASONE) 10 MG tablet Take by mouth.  . SYMBICORT 160-4.5 MCG/ACT inhaler Inhale 2 puffs into the lungs in the morning and at bedtime.   . trospium (SANCTURA) 20 MG tablet Take 20 mg by mouth at bedtime.    Allergies:   Penicillins, Morphine and related, Chlorzoxazone, Ciprofloxacin, Clarithromycin, Clindamycin hcl, Codeine sulfate, Conjugated estrogens, Hydrocodone-homatropine, Hydromorphone, Lidocaine, Meperidine, Propoxyphene, Sulfa antibiotics, and Tioconazole  Review of Systems (ROS):  Review of systems NEGATIVE unless otherwise noted in narrative H&P section.   Vital Signs: Today's Vitals   08/08/19 1154 08/08/19 1155 08/08/19 1156 08/08/19 1233  BP: 140/62     Pulse: 100     Resp: 18     Temp: 98.2 F (36.8 C)     TempSrc: Oral     SpO2: 97%     Weight:   186 lb (84.4 kg)   Height:   4\' 11"  (1.499 m)   PainSc:  5    5     Physical Exam: Physical Exam  Constitutional: She is oriented to person, place, and time and well-developed, well-nourished, and in no distress.  HENT:  Head: Normocephalic and atraumatic.  Eyes: Pupils are equal, round, and reactive to light.  Cardiovascular: Normal rate, regular rhythm, normal heart sounds and intact distal pulses.  Pulmonary/Chest: Effort normal and breath sounds normal.  Abdominal: Soft. Normal appearance and bowel sounds are normal. She exhibits no distension. There is abdominal tenderness in the suprapubic area. There is no CVA tenderness.  Musculoskeletal:     Lumbar back: Pain (chronic; at baseline) present.  Neurological: She is alert and oriented to person, place, and time. Gait normal.  Skin: Skin is warm and dry. No rash noted. She is  not diaphoretic.  Psychiatric: Mood, memory, affect and judgment normal.  Nursing note and vitals reviewed.   Urgent Care Treatments / Results:   Orders Placed This Encounter  Procedures  . Urine culture  . Urinalysis, Complete w Microscopic    LABS: PLEASE NOTE: all labs that were ordered this encounter are listed, however only abnormal results are displayed. -UA -Urine culture  EKG: -None  RADIOLOGY: No results found.  PROCEDURES: Procedures  MEDICATIONS RECEIVED THIS VISIT: Medications - No data to display  PERTINENT CLINICAL COURSE NOTES/UPDATES:   Initial Impression / Assessment and Plan / Urgent Care Course:  Pertinent labs & imaging results that were available during my care of the patient were personally reviewed by me and considered in my medical decision making (see lab/imaging section of note for values and interpretations).  Tashanda Adalena Abdulla is a 85 y.o. female who presents to [facility] today with complaints of Urinary Frequency  Patient is well appearing overall in clinic today. She does not appear to be in any acute distress. Presenting symptoms (see HPI) and exam as  documented above.  Marland Kitchen UA was (+) for trace LE, which given patient's history of recurrent UTIs, represents an early infection; reflex culture sent.  o Will treat with a 7 day course of cephalexin. Patient encouraged to complete the entire course of antibiotics even if she begins to feel better. She was advised that if culture demonstrates resistance to the prescribed antibiotic, she will be contacted and advised of the need to change the antibiotic being used to treat her infection.   o Patient encouraged to increase her fluid intake as much as possible. Discussed that water is always best to flush the urinary tract. She was advised to avoid caffeine containing fluids until her infections clears, as caffeine can cause her to experience painful bladder spasms.   o May use Tylenol and/or Ibuprofen as needed for pain/fever.    Discussed follow up with primary care physician in 1 week for re-evaluation. I have reviewed the follow up and strict return precautions for any new or worsening symptoms. Patient is aware of symptoms that would be deemed urgent/emergent, and would thus require further evaluation either here or in the emergency department. At the time of discharge, she verbalized understanding and consent with the discharge plan as it was reviewed with her. All questions were fielded by provider and/or clinic staff prior to patient discharge.    Final Clinical Impressions / Urgent Care Diagnoses:   Final diagnoses:  Urinary tract infection without hematuria, site unspecified    New Prescriptions:  [place] Controlled Substance Registry consulted? Not Applicable  Meds ordered this encounter  Medications  . cephALEXin (KEFLEX) 500 MG capsule    Sig: Take 1 capsule (500 mg total) by mouth 2 (two) times daily for 7 days.    Dispense:  14 capsule    Refill:  0    Recommended Follow up Care:  Patient encouraged to follow up with the following provider within the specified time frame, or sooner as  dictated by the severity of her symptoms. As always, she was instructed that for any urgent/emergent care needs, she should seek care either here or in the emergency department for more immediate evaluation.  Follow-up Information    [name], MD In 1 week.   Specialty: Internal Medicine Why: General reassessment of symptoms if not improving Contact information: [address] [place] [address] [phone]  NOTE: This note was prepared using Lobby[employer] along with smaller Company secretary. Despite my best ability to proofread, there is the potential that transcriptional errors may still occur from this process, and are completely unintentional.    Karen Kitchens, NP 08/09/19 [phone]

## 2019-08-12 DIAGNOSIS — M170 Bilateral primary osteoarthritis of knee: Secondary | ICD-10-CM | POA: Diagnosis not present

## 2019-08-12 DIAGNOSIS — M47816 Spondylosis without myelopathy or radiculopathy, lumbar region: Secondary | ICD-10-CM | POA: Diagnosis not present

## 2019-08-12 DIAGNOSIS — Z9181 History of falling: Secondary | ICD-10-CM | POA: Diagnosis not present

## 2019-08-12 DIAGNOSIS — M069 Rheumatoid arthritis, unspecified: Secondary | ICD-10-CM | POA: Diagnosis not present

## 2019-08-12 DIAGNOSIS — Z6841 Body Mass Index (BMI) 40.0 and over, adult: Secondary | ICD-10-CM | POA: Diagnosis not present

## 2019-08-12 DIAGNOSIS — G8929 Other chronic pain: Secondary | ICD-10-CM | POA: Diagnosis not present

## 2019-08-12 DIAGNOSIS — F419 Anxiety disorder, unspecified: Secondary | ICD-10-CM | POA: Diagnosis not present

## 2019-08-12 DIAGNOSIS — M5410 Radiculopathy, site unspecified: Secondary | ICD-10-CM | POA: Diagnosis not present

## 2019-08-12 DIAGNOSIS — R32 Unspecified urinary incontinence: Secondary | ICD-10-CM | POA: Diagnosis not present

## 2019-08-12 DIAGNOSIS — E785 Hyperlipidemia, unspecified: Secondary | ICD-10-CM | POA: Diagnosis not present

## 2019-08-12 DIAGNOSIS — Z7982 Long term (current) use of aspirin: Secondary | ICD-10-CM | POA: Diagnosis not present

## 2019-08-12 DIAGNOSIS — F329 Major depressive disorder, single episode, unspecified: Secondary | ICD-10-CM | POA: Diagnosis not present

## 2019-08-12 DIAGNOSIS — I872 Venous insufficiency (chronic) (peripheral): Secondary | ICD-10-CM | POA: Diagnosis not present

## 2019-08-12 DIAGNOSIS — Z7951 Long term (current) use of inhaled steroids: Secondary | ICD-10-CM | POA: Diagnosis not present

## 2019-08-12 DIAGNOSIS — J4520 Mild intermittent asthma, uncomplicated: Secondary | ICD-10-CM | POA: Diagnosis not present

## 2019-08-12 DIAGNOSIS — S22088D Other fracture of T11-T12 vertebra, subsequent encounter for fracture with routine healing: Secondary | ICD-10-CM | POA: Diagnosis not present

## 2019-08-12 DIAGNOSIS — K219 Gastro-esophageal reflux disease without esophagitis: Secondary | ICD-10-CM | POA: Diagnosis not present

## 2019-08-12 DIAGNOSIS — J449 Chronic obstructive pulmonary disease, unspecified: Secondary | ICD-10-CM | POA: Diagnosis not present

## 2019-08-14 DIAGNOSIS — M5416 Radiculopathy, lumbar region: Secondary | ICD-10-CM | POA: Diagnosis not present

## 2019-08-14 DIAGNOSIS — M47816 Spondylosis without myelopathy or radiculopathy, lumbar region: Secondary | ICD-10-CM | POA: Diagnosis not present

## 2019-08-14 DIAGNOSIS — Z9889 Other specified postprocedural states: Secondary | ICD-10-CM | POA: Diagnosis not present

## 2019-08-20 ENCOUNTER — Other Ambulatory Visit: Payer: Self-pay

## 2019-08-20 ENCOUNTER — Ambulatory Visit
Admission: EM | Admit: 2019-08-20 | Discharge: 2019-08-20 | Disposition: A | Discharge status: Home / Self Care | Payer: PPO | Attending: Internal Medicine | Admitting: Internal Medicine

## 2019-08-20 DIAGNOSIS — R112 Nausea with vomiting, unspecified: Secondary | ICD-10-CM | POA: Insufficient documentation

## 2019-08-20 DIAGNOSIS — R8271 Bacteriuria: Secondary | ICD-10-CM | POA: Insufficient documentation

## 2019-08-20 DIAGNOSIS — H6123 Impacted cerumen, bilateral: Secondary | ICD-10-CM

## 2019-08-20 DIAGNOSIS — R519 Headache, unspecified: Secondary | ICD-10-CM | POA: Diagnosis not present

## 2019-08-20 LAB — URINALYSIS, COMPLETE (UACMP) WITH MICROSCOPIC
Bilirubin Urine: NEGATIVE
Glucose, UA: NEGATIVE mg/dL
Hgb urine dipstick: NEGATIVE
Ketones, ur: NEGATIVE mg/dL
Leukocytes,Ua: NEGATIVE
Nitrite: NEGATIVE
Protein, ur: 100 mg/dL — AB
Specific Gravity, Urine: 1.02 (ref 1.005–1.030)
WBC, UA: NONE SEEN WBC/hpf (ref 0–5)
pH: 8 (ref 5.0–8.0)

## 2019-08-20 MED ORDER — PROMETHAZINE HCL 25 MG/ML IJ SOLN
25.0000 mg | Freq: Once | INTRAMUSCULAR | Status: AC
  Administered 2019-08-20: 25 mg via INTRAMUSCULAR

## 2019-08-20 NOTE — ED Triage Notes (Signed)
Pt presents with headache that started this morning in sinus area.  Described as a constant pain.  Developed nausea and vomiting at 1100.  Denies vision changes, extremity weakness or numbness.  No speech changes per son at bedside.  Has been unable to keep any food down today.  Took Zofran ODT today with no relief.   Pt had recent spine surgery.  Has had generalized weakness and difficulty ambulating or standing since surgery.  No new onset weakness.

## 2019-08-20 NOTE — ED Notes (Signed)
Patient given [employer].

## 2019-08-20 NOTE — ED Provider Notes (Signed)
[facility]    CSN: [id number] Arrival date & time: 08/20/19  1640      History   Chief Complaint Chief Complaint  Patient presents with  . Headache    HPI Amber Kirk is a 85 y.o. female.   Subjective:   Amber Kirk is a 85 y.o. female who presents for evaluation of headache, nausea and vomiting. Patient reports waking up with this headache. The headache is described as mild and is frontal in location.  The patient rates the pain a 6 on a scale from 1 to 10. Precipitating factors include none which have been determined. The headache was not preceded by an aura. The patient denies dizziness, numbness of extremities, speech difficulties or vision problems. Patient denies chest pain, diarrhea, fever, neck stiffness, photophobia or rash.  The patient reported that she didn't feel that bad with the exception of the headache. She was able to eat breakfast at 8:30 AM. She then began to have nausea and vomiting around 10:30 AM. The patient describes nausea as moderate. Vomiting has occurred several times since symptom onset. Vomitus was initially of undigested food but now is more bilious. Symptoms have been associated with inability to keep down fluids and headache.  Patient denies alcohol overuse, fever, hematemesis, melena or possibility of pregnancy. Symptoms have waxed and waned since onset. She tried taking tylenol and Zofran but was unable to keep it down. Other history includes: nothing pertinent. Family history includes no known family members with significant headaches. The patient has been completely vaccinated against COVID-19 since February 2021.   The patient also would like her ears to be cleaned out while she is here. She reports that they have been clogged up for the past 2 days, right greater than left. She denies any hearing difficultly, tinnuitis or dizziness. No pervious URI.   The following portions of the patient's history were reviewed  and updated as appropriate: allergies, current medications, past family history, past medical history, past social history, past surgical history and problem list.       Past Medical History:  Diagnosis Date  . Allergy   . Asthma   . Bowel obstruction ([place]) 06/2019  . Complication of anesthesia   . COPD (chronic obstructive pulmonary disease) ([employer])   . GERD (gastroesophageal reflux disease)   . Heart murmur   . Interstitial cystitis   . PONV (postoperative nausea and vomiting)    after 1st cataract surgery  . Sleep apnea    mild-no cpap     Patient Active Problem List   Diagnosis Date Noted  . Ileus ([place])   . Constipation   . Compression fracture of T12 vertebra ([facility])   . Colitis 06/27/2019  . Chronic venous insufficiency 07/04/2016  . Lymphedema 07/04/2016  . Asthma 07/04/2016  . Hyperlipidemia 07/04/2016    Past Surgical History:  Procedure Laterality Date  . ABDOMINAL HYSTERECTOMY    . APPENDECTOMY    . BLADDER SURGERY    . CHOLECYSTECTOMY    . EYE SURGERY     bil cataracts  . KYPHOPLASTY N/A 07/05/2019   Procedure: T12 KYPHOPLASTY;  Surgeon: Hessie Knows, MD;  Location: [facility] ORS;  Service: Orthopedics;  Laterality: N/A;  . KYPHOPLASTY N/A 07/23/2019   Procedure: L1 KYPHOPLASTY;  Surgeon: Hessie Knows, MD;  Location: [facility] OR[facility];  Service: Orthopedics;  Laterality: N/A;  . TONSILLECTOMY      OB History   No obstetric history on file.      Home Medications  Prior to Admission medications   Medication Sig Start Date End Date Taking? Authorizing Provider  acetaminophen (TYLENOL) 500 MG tablet Take 1,000 mg by mouth every 6 (six) hours as needed.   Yes [provider]  albuterol (VENTOLIN HFA) 108 (90 Base) MCG/ACT inhaler Inhale 1-2 puffs into the lungs every 6 (six) hours as needed for wheezing or shortness of breath.   Yes [provider]  Aloe Vera Freeze Dried POWD Take 3 capsules by mouth in the morning and at bedtime.   Yes  [provider]  aspirin EC 81 MG tablet Take 81 mg by mouth daily.  04/25/12  Yes [provider]  azelastine (ASTELIN) 0.1 % nasal spray Place 1 spray into both nostrils 2 (two) times daily. Use in each nostril as directed   Yes [provider]  Calcium Carbonate (CALCIUM 500 PO) Take 500 mg by mouth daily.   Yes [provider]  cetirizine (ZYRTEC) 10 MG tablet Take 10 mg by mouth every morning.    Yes [provider]  cholecalciferol (VITAMIN D) 25 MCG (1000 UNIT) tablet Take 1,000 Units by mouth daily.   Yes [provider]  docusate sodium (COLACE) 100 MG capsule Take 1 capsule (100 mg total) by mouth 2 (two) times daily. 06/29/19  Yes Fritzi Mandes, MD  doxepin (SINEQUAN) 25 MG capsule Take 25 mg by mouth at bedtime.    Yes [provider]  fluticasone (FLONASE) 50 MCG/ACT nasal spray Place 1 spray into both nostrils in the morning and at bedtime.    Yes [provider]  hydroxychloroquine (PLAQUENIL) 200 MG tablet Take 100 mg by mouth daily.    Yes [provider]  LINZESS 145 MCG CAPS capsule Take 145 mcg by mouth daily as needed (constipation.).  06/25/19  Yes [provider]  lovastatin (MEVACOR) 20 MG tablet Take 20 mg by mouth daily with supper.    Yes [provider]  montelukast (SINGULAIR) 10 MG tablet Take 10 mg by mouth every evening.    Yes [provider]  Multiple Vitamins-Minerals (ADULT GUMMY PO) Take 2 tablets by mouth daily. Vitafusion Women's   Yes [provider]  olopatadine (PATADAY) 0.1 % ophthalmic solution Place 1 drop into both eyes 2 (two) times daily as needed for allergies.    Yes [provider]  omeprazole (PRILOSEC) 20 MG capsule Take 20 mg by mouth in the morning and at bedtime.    Yes [provider]  ondansetron (ZOFRAN-ODT) 4 MG disintegrating tablet Take 4 mg by mouth every 8 (eight) hours as needed for nausea/vomiting. 07/01/19   Yes [provider]  polyethylene glycol (MIRALAX / GLYCOLAX) 17 g packet Take 17 g by mouth 2 (two) times daily. 06/29/19  Yes Fritzi Mandes, MD  SYMBICORT 160-4.5 MCG/ACT inhaler Inhale 2 puffs into the lungs in the morning and at bedtime.    Yes [provider]  trospium (SANCTURA) 20 MG tablet Take 20 mg by mouth at bedtime.   Yes [provider]  predniSONE (DELTASONE) 10 MG tablet Take by mouth. 08/06/19   [provider]    Family History Family History  Problem Relation Age of Onset  . Heart disease Mother   . Stroke Father   . Breast cancer Neg Hx     Social History Social History   Tobacco Use  . Smoking status: Never Smoker  . Smokeless tobacco: Never Used  Vaping Use  . Vaping Use: Never used  Substance Use Topics  . Alcohol use: No  . Drug use: No     Allergies   Penicillins, Morphine and related, Chlorzoxazone, Ciprofloxacin, Clarithromycin, Clindamycin hcl, Codeine sulfate, Conjugated estrogens, Hydrocodone-homatropine, Hydromorphone, Lidocaine, Meperidine, Propoxyphene, Sulfa antibiotics, and Tioconazole   Review of Systems Review of Systems  Constitutional: Negative for fever.  HENT: Negative for ear discharge and ear pain.   Eyes: Negative for photophobia and visual disturbance.  Respiratory: Negative.   Cardiovascular: Negative.   Gastrointestinal: Positive for nausea and vomiting. Negative for abdominal pain, constipation and diarrhea.  Skin: Negative for rash.  Neurological: Positive for headaches. Negative for dizziness, facial asymmetry, speech difficulty, weakness, light-headedness and numbness.  All other systems reviewed and are negative.    Physical Exam Triage Vital Signs ED Triage Vitals  Enc Vitals Group     BP 08/20/19 1706 (!) 165/71     Pulse Rate 08/20/19 1706 (!) 104     Resp 08/20/19 1706 18     Temp 08/20/19 1706 98 F (36.7 C)     Temp Source 08/20/19 1706 Oral     SpO2 08/20/19 1706 95 %      Weight --      Height --      Head Circumference --      Peak Flow --      Pain Score 08/20/19 1658 6     Pain Loc --      Pain Edu? --      Excl. in [place]? --    No data found.  Updated Vital Signs BP (!) 165/71 (BP Location: Right Arm)   Pulse (!) 104   Temp 98 F (36.7 C) (Oral)   Resp 18   SpO2 95%   Visual Acuity Right Eye Distance:   Left Eye Distance:   Bilateral Distance:    Right Eye Near:   Left Eye Near:    Bilateral Near:     Physical Exam Vitals reviewed.  Constitutional:      General: She is not in acute distress.    Appearance: She is well-developed. She is not ill-appearing, toxic-appearing or diaphoretic.  HENT:     Head: Normocephalic.     Right Ear: Hearing normal. There is impacted cerumen.     Left Ear: Hearing normal. There is impacted cerumen.     Mouth/Throat:     Mouth: Mucous membranes are moist.  Eyes:     General: No visual field deficit.    Extraocular Movements: Extraocular movements intact.     Pupils: Pupils are equal, round, and reactive to light.  Cardiovascular:     Rate and Rhythm: Normal rate and regular rhythm.     Heart sounds: Murmur heard.   Musculoskeletal:        General: Normal range of motion.     Cervical back: Normal range of motion and neck supple. No rigidity.  Lymphadenopathy:     Cervical: No cervical adenopathy.  Skin:    General: Skin is warm and dry.  Neurological:     Mental Status: She is alert.     Cranial Nerves: No cranial nerve deficit, dysarthria or facial asymmetry.     Sensory: No sensory deficit.     Motor: No weakness.  Psychiatric:        Mood and Affect: Mood normal.        Behavior: Behavior normal.      UC Treatments / Results  Labs (all labs ordered are listed, but only abnormal results  are displayed) Labs Reviewed  URINALYSIS, COMPLETE (UACMP) WITH MICROSCOPIC - Abnormal; Notable for the following components:      Result Value   APPearance CLOUDY (*)    Protein, ur 100 (*)     Bacteria, UA MANY (*)    All other components within normal limits  URINE CULTURE    EKG   Radiology No results found.  Procedures Ear Cerumen Removal  Date/Time: 08/20/2019 6:17 PM Performed by: Gerald Leitz, CMA Authorized by: Enrique Sack, FNP   Consent:    Consent obtained:  Verbal   Consent given by:  Patient   Risks discussed:  Bleeding, infection, pain, dizziness, incomplete removal and TM perforation   Alternatives discussed:  No treatment Procedure details:    Location:  L ear and R ear   Procedure type: irrigation   Post-procedure details:    Inspection:  TM intact   Hearing quality:  Normal   Patient tolerance of procedure:  Tolerated well, no immediate complications   (including critical care time)  Medications Ordered in UC Medications  promethazine (PHENERGAN) injection 25 mg (25 mg Intramuscular Given 08/20/19 1754)    Initial Impression / Assessment and Plan / UC Course  I have reviewed the triage vital signs and the nursing notes.  Pertinent labs & imaging results that were available during my care of the patient were reviewed by me and considered in my medical decision making (see chart for details).    85 year old female presents for headache, nausea and vomiting.  Symptom onset earlier this morning.  Patient has been unable to keep anything down.  Urinalysis shows asymptomatic bacteriuria, no other acute findings or indication for acute infection noted. Physical exam unremarkable.  No active vomiting while in clinic.  Patient received promethazine IM.  She later asked for water and was able to tolerate that without difficulty.  Bilateral ears were irrigated per patient request.  Plan was to discharge home with close outpatient follow-up.  However, patient eloped prior to being discharged.  Today's evaluation has revealed no signs of a dangerous process. Discussed diagnosis with patient and/or guardian. Patient and/or guardian aware of their  diagnosis, possible red flag symptoms to watch out for and need for close follow up. Patient and/or guardian understands verbal and written discharge instructions. Patient and/or guardian comfortable with plan and disposition.  Patient and/or guardian has a clear mental status at this time, good insight into illness (after discussion and teaching) and has clear judgment to make decisions regarding their care  This care was provided during an unprecedented National Emergency due to the Novel Coronavirus (COVID-19) pandemic. COVID-19 infections and transmission risks place heavy strains on healthcare resources.  As this pandemic evolves, our facility, providers, and staff strive to respond fluidly, to remain operational, and to provide care relative to available resources and information. Outcomes are unpredictable and treatments are without well-defined guidelines. Further, the impact of COVID-19 on all aspects of urgent care, including the impact to patients seeking care for reasons other than COVID-19, is unavoidable during this national emergency. At this time of the global pandemic, management of patients has significantly changed, even for non-COVID positive patients given high local and regional COVID volumes at this time requiring high healthcare system and resource utilization. The standard of care for management of both COVID suspected and non-COVID suspected patients continues to change rapidly at the local, regional, national, and global levels. This patient was worked up and treated to the best available but ever changing evidence and  resources available at this current time.   Documentation was completed with the aid of voice recognition software. Transcription may contain typographical errors.  Final Clinical Impressions(s) / UC Diagnoses   Final diagnoses:  Acute nonintractable headache, unspecified headache type  Asymptomatic bacteriuria  Nausea and vomiting, intractability of vomiting not  specified, unspecified vomiting type   Discharge Instructions   None    ED Prescriptions    None     PDMP not reviewed this encounter.   Enrique Sack, [place] 08/20/19 1902

## 2019-08-21 LAB — URINE CULTURE
Culture: NO GROWTH
Special Requests: NORMAL

## 2019-08-22 DIAGNOSIS — E785 Hyperlipidemia, unspecified: Secondary | ICD-10-CM | POA: Diagnosis not present

## 2019-08-22 DIAGNOSIS — Z7951 Long term (current) use of inhaled steroids: Secondary | ICD-10-CM | POA: Diagnosis not present

## 2019-08-22 DIAGNOSIS — H6123 Impacted cerumen, bilateral: Secondary | ICD-10-CM | POA: Diagnosis not present

## 2019-08-22 DIAGNOSIS — M069 Rheumatoid arthritis, unspecified: Secondary | ICD-10-CM | POA: Diagnosis not present

## 2019-08-22 DIAGNOSIS — Z6841 Body Mass Index (BMI) 40.0 and over, adult: Secondary | ICD-10-CM | POA: Diagnosis not present

## 2019-08-22 DIAGNOSIS — H9121 Sudden idiopathic hearing loss, right ear: Secondary | ICD-10-CM | POA: Diagnosis not present

## 2019-08-22 DIAGNOSIS — M170 Bilateral primary osteoarthritis of knee: Secondary | ICD-10-CM | POA: Diagnosis not present

## 2019-08-22 DIAGNOSIS — M5410 Radiculopathy, site unspecified: Secondary | ICD-10-CM | POA: Diagnosis not present

## 2019-08-22 DIAGNOSIS — I872 Venous insufficiency (chronic) (peripheral): Secondary | ICD-10-CM | POA: Diagnosis not present

## 2019-08-22 DIAGNOSIS — H90A21 Sensorineural hearing loss, unilateral, right ear, with restricted hearing on the contralateral side: Secondary | ICD-10-CM | POA: Diagnosis not present

## 2019-08-22 DIAGNOSIS — J449 Chronic obstructive pulmonary disease, unspecified: Secondary | ICD-10-CM | POA: Diagnosis not present

## 2019-08-22 DIAGNOSIS — S22088D Other fracture of T11-T12 vertebra, subsequent encounter for fracture with routine healing: Secondary | ICD-10-CM | POA: Diagnosis not present

## 2019-08-22 DIAGNOSIS — K219 Gastro-esophageal reflux disease without esophagitis: Secondary | ICD-10-CM | POA: Diagnosis not present

## 2019-08-22 DIAGNOSIS — Z9181 History of falling: Secondary | ICD-10-CM | POA: Diagnosis not present

## 2019-08-22 DIAGNOSIS — Z7982 Long term (current) use of aspirin: Secondary | ICD-10-CM | POA: Diagnosis not present

## 2019-08-22 DIAGNOSIS — G8929 Other chronic pain: Secondary | ICD-10-CM | POA: Diagnosis not present

## 2019-08-22 DIAGNOSIS — J4520 Mild intermittent asthma, uncomplicated: Secondary | ICD-10-CM | POA: Diagnosis not present

## 2019-08-22 DIAGNOSIS — F329 Major depressive disorder, single episode, unspecified: Secondary | ICD-10-CM | POA: Diagnosis not present

## 2019-08-22 DIAGNOSIS — F419 Anxiety disorder, unspecified: Secondary | ICD-10-CM | POA: Diagnosis not present

## 2019-08-22 DIAGNOSIS — M47816 Spondylosis without myelopathy or radiculopathy, lumbar region: Secondary | ICD-10-CM | POA: Diagnosis not present

## 2019-08-22 DIAGNOSIS — R32 Unspecified urinary incontinence: Secondary | ICD-10-CM | POA: Diagnosis not present

## 2019-08-27 DIAGNOSIS — E785 Hyperlipidemia, unspecified: Secondary | ICD-10-CM | POA: Diagnosis not present

## 2019-08-27 DIAGNOSIS — E559 Vitamin D deficiency, unspecified: Secondary | ICD-10-CM | POA: Diagnosis not present

## 2019-08-27 DIAGNOSIS — S22088D Other fracture of T11-T12 vertebra, subsequent encounter for fracture with routine healing: Secondary | ICD-10-CM | POA: Diagnosis not present

## 2019-08-27 DIAGNOSIS — F419 Anxiety disorder, unspecified: Secondary | ICD-10-CM | POA: Diagnosis not present

## 2019-08-27 DIAGNOSIS — M5410 Radiculopathy, site unspecified: Secondary | ICD-10-CM | POA: Diagnosis not present

## 2019-08-27 DIAGNOSIS — F329 Major depressive disorder, single episode, unspecified: Secondary | ICD-10-CM | POA: Diagnosis not present

## 2019-08-27 DIAGNOSIS — M47816 Spondylosis without myelopathy or radiculopathy, lumbar region: Secondary | ICD-10-CM | POA: Diagnosis not present

## 2019-08-27 DIAGNOSIS — Z7951 Long term (current) use of inhaled steroids: Secondary | ICD-10-CM | POA: Diagnosis not present

## 2019-08-27 DIAGNOSIS — M069 Rheumatoid arthritis, unspecified: Secondary | ICD-10-CM | POA: Diagnosis not present

## 2019-08-27 DIAGNOSIS — M170 Bilateral primary osteoarthritis of knee: Secondary | ICD-10-CM | POA: Diagnosis not present

## 2019-08-27 DIAGNOSIS — R32 Unspecified urinary incontinence: Secondary | ICD-10-CM | POA: Diagnosis not present

## 2019-08-27 DIAGNOSIS — I872 Venous insufficiency (chronic) (peripheral): Secondary | ICD-10-CM | POA: Diagnosis not present

## 2019-08-27 DIAGNOSIS — Z7982 Long term (current) use of aspirin: Secondary | ICD-10-CM | POA: Diagnosis not present

## 2019-08-27 DIAGNOSIS — Z9181 History of falling: Secondary | ICD-10-CM | POA: Diagnosis not present

## 2019-08-27 DIAGNOSIS — Z6841 Body Mass Index (BMI) 40.0 and over, adult: Secondary | ICD-10-CM | POA: Diagnosis not present

## 2019-08-27 DIAGNOSIS — J4520 Mild intermittent asthma, uncomplicated: Secondary | ICD-10-CM | POA: Diagnosis not present

## 2019-08-27 DIAGNOSIS — M810 Age-related osteoporosis without current pathological fracture: Secondary | ICD-10-CM | POA: Diagnosis not present

## 2019-08-27 DIAGNOSIS — G8929 Other chronic pain: Secondary | ICD-10-CM | POA: Diagnosis not present

## 2019-08-27 DIAGNOSIS — J449 Chronic obstructive pulmonary disease, unspecified: Secondary | ICD-10-CM | POA: Diagnosis not present

## 2019-08-27 DIAGNOSIS — K219 Gastro-esophageal reflux disease without esophagitis: Secondary | ICD-10-CM | POA: Diagnosis not present

## 2019-08-29 DIAGNOSIS — K219 Gastro-esophageal reflux disease without esophagitis: Secondary | ICD-10-CM | POA: Diagnosis not present

## 2019-08-29 DIAGNOSIS — Z880 Allergy status to penicillin: Secondary | ICD-10-CM | POA: Diagnosis not present

## 2019-08-29 DIAGNOSIS — N3941 Urge incontinence: Secondary | ICD-10-CM | POA: Diagnosis not present

## 2019-08-29 DIAGNOSIS — Z7982 Long term (current) use of aspirin: Secondary | ICD-10-CM | POA: Diagnosis not present

## 2019-08-29 DIAGNOSIS — Z9049 Acquired absence of other specified parts of digestive tract: Secondary | ICD-10-CM | POA: Diagnosis not present

## 2019-08-29 DIAGNOSIS — Z885 Allergy status to narcotic agent status: Secondary | ICD-10-CM | POA: Diagnosis not present

## 2019-08-29 DIAGNOSIS — R339 Retention of urine, unspecified: Secondary | ICD-10-CM | POA: Diagnosis not present

## 2019-08-29 DIAGNOSIS — Z882 Allergy status to sulfonamides status: Secondary | ICD-10-CM | POA: Diagnosis not present

## 2019-08-29 DIAGNOSIS — N390 Urinary tract infection, site not specified: Secondary | ICD-10-CM | POA: Diagnosis not present

## 2019-08-29 DIAGNOSIS — E785 Hyperlipidemia, unspecified: Secondary | ICD-10-CM | POA: Diagnosis not present

## 2019-09-02 DIAGNOSIS — M5442 Lumbago with sciatica, left side: Secondary | ICD-10-CM | POA: Diagnosis not present

## 2019-09-02 DIAGNOSIS — M5441 Lumbago with sciatica, right side: Secondary | ICD-10-CM | POA: Diagnosis not present

## 2019-09-02 DIAGNOSIS — G8929 Other chronic pain: Secondary | ICD-10-CM | POA: Diagnosis not present

## 2019-09-03 DIAGNOSIS — Z7982 Long term (current) use of aspirin: Secondary | ICD-10-CM | POA: Diagnosis not present

## 2019-09-03 DIAGNOSIS — J449 Chronic obstructive pulmonary disease, unspecified: Secondary | ICD-10-CM | POA: Diagnosis not present

## 2019-09-03 DIAGNOSIS — S22088D Other fracture of T11-T12 vertebra, subsequent encounter for fracture with routine healing: Secondary | ICD-10-CM | POA: Diagnosis not present

## 2019-09-03 DIAGNOSIS — M47816 Spondylosis without myelopathy or radiculopathy, lumbar region: Secondary | ICD-10-CM | POA: Diagnosis not present

## 2019-09-03 DIAGNOSIS — F419 Anxiety disorder, unspecified: Secondary | ICD-10-CM | POA: Diagnosis not present

## 2019-09-03 DIAGNOSIS — I872 Venous insufficiency (chronic) (peripheral): Secondary | ICD-10-CM | POA: Diagnosis not present

## 2019-09-03 DIAGNOSIS — J4520 Mild intermittent asthma, uncomplicated: Secondary | ICD-10-CM | POA: Diagnosis not present

## 2019-09-03 DIAGNOSIS — Z9181 History of falling: Secondary | ICD-10-CM | POA: Diagnosis not present

## 2019-09-03 DIAGNOSIS — K219 Gastro-esophageal reflux disease without esophagitis: Secondary | ICD-10-CM | POA: Diagnosis not present

## 2019-09-03 DIAGNOSIS — M170 Bilateral primary osteoarthritis of knee: Secondary | ICD-10-CM | POA: Diagnosis not present

## 2019-09-03 DIAGNOSIS — Z7951 Long term (current) use of inhaled steroids: Secondary | ICD-10-CM | POA: Diagnosis not present

## 2019-09-03 DIAGNOSIS — R32 Unspecified urinary incontinence: Secondary | ICD-10-CM | POA: Diagnosis not present

## 2019-09-03 DIAGNOSIS — Z6841 Body Mass Index (BMI) 40.0 and over, adult: Secondary | ICD-10-CM | POA: Diagnosis not present

## 2019-09-03 DIAGNOSIS — F329 Major depressive disorder, single episode, unspecified: Secondary | ICD-10-CM | POA: Diagnosis not present

## 2019-09-03 DIAGNOSIS — E785 Hyperlipidemia, unspecified: Secondary | ICD-10-CM | POA: Diagnosis not present

## 2019-09-03 DIAGNOSIS — M5410 Radiculopathy, site unspecified: Secondary | ICD-10-CM | POA: Diagnosis not present

## 2019-09-03 DIAGNOSIS — M069 Rheumatoid arthritis, unspecified: Secondary | ICD-10-CM | POA: Diagnosis not present

## 2019-09-03 DIAGNOSIS — G8929 Other chronic pain: Secondary | ICD-10-CM | POA: Diagnosis not present

## 2019-09-03 DIAGNOSIS — M810 Age-related osteoporosis without current pathological fracture: Secondary | ICD-10-CM | POA: Diagnosis not present

## 2019-09-04 ENCOUNTER — Other Ambulatory Visit: Payer: Self-pay | Admitting: Physical Medicine & Rehabilitation

## 2019-09-04 DIAGNOSIS — M5442 Lumbago with sciatica, left side: Secondary | ICD-10-CM

## 2019-09-04 DIAGNOSIS — G8929 Other chronic pain: Secondary | ICD-10-CM

## 2019-09-10 DIAGNOSIS — E785 Hyperlipidemia, unspecified: Secondary | ICD-10-CM | POA: Diagnosis not present

## 2019-09-10 DIAGNOSIS — I872 Venous insufficiency (chronic) (peripheral): Secondary | ICD-10-CM | POA: Diagnosis not present

## 2019-09-10 DIAGNOSIS — J4520 Mild intermittent asthma, uncomplicated: Secondary | ICD-10-CM | POA: Diagnosis not present

## 2019-09-10 DIAGNOSIS — K219 Gastro-esophageal reflux disease without esophagitis: Secondary | ICD-10-CM | POA: Diagnosis not present

## 2019-09-10 DIAGNOSIS — Z7982 Long term (current) use of aspirin: Secondary | ICD-10-CM | POA: Diagnosis not present

## 2019-09-10 DIAGNOSIS — F419 Anxiety disorder, unspecified: Secondary | ICD-10-CM | POA: Diagnosis not present

## 2019-09-10 DIAGNOSIS — G8929 Other chronic pain: Secondary | ICD-10-CM | POA: Diagnosis not present

## 2019-09-10 DIAGNOSIS — Z6841 Body Mass Index (BMI) 40.0 and over, adult: Secondary | ICD-10-CM | POA: Diagnosis not present

## 2019-09-10 DIAGNOSIS — Z9181 History of falling: Secondary | ICD-10-CM | POA: Diagnosis not present

## 2019-09-10 DIAGNOSIS — M170 Bilateral primary osteoarthritis of knee: Secondary | ICD-10-CM | POA: Diagnosis not present

## 2019-09-10 DIAGNOSIS — Z7951 Long term (current) use of inhaled steroids: Secondary | ICD-10-CM | POA: Diagnosis not present

## 2019-09-10 DIAGNOSIS — M5410 Radiculopathy, site unspecified: Secondary | ICD-10-CM | POA: Diagnosis not present

## 2019-09-10 DIAGNOSIS — M069 Rheumatoid arthritis, unspecified: Secondary | ICD-10-CM | POA: Diagnosis not present

## 2019-09-10 DIAGNOSIS — J449 Chronic obstructive pulmonary disease, unspecified: Secondary | ICD-10-CM | POA: Diagnosis not present

## 2019-09-10 DIAGNOSIS — M47816 Spondylosis without myelopathy or radiculopathy, lumbar region: Secondary | ICD-10-CM | POA: Diagnosis not present

## 2019-09-10 DIAGNOSIS — R32 Unspecified urinary incontinence: Secondary | ICD-10-CM | POA: Diagnosis not present

## 2019-09-10 DIAGNOSIS — S22088D Other fracture of T11-T12 vertebra, subsequent encounter for fracture with routine healing: Secondary | ICD-10-CM | POA: Diagnosis not present

## 2019-09-10 DIAGNOSIS — F329 Major depressive disorder, single episode, unspecified: Secondary | ICD-10-CM | POA: Diagnosis not present

## 2019-09-12 ENCOUNTER — Ambulatory Visit
Admission: RE | Admit: 2019-09-12 | Discharge: 2019-09-12 | Disposition: A | Discharge status: Home / Self Care | Payer: PPO | Source: Ambulatory Visit | Attending: Physical Medicine & Rehabilitation | Admitting: Physical Medicine & Rehabilitation

## 2019-09-12 ENCOUNTER — Other Ambulatory Visit: Payer: Self-pay

## 2019-09-12 DIAGNOSIS — M5136 Other intervertebral disc degeneration, lumbar region: Secondary | ICD-10-CM | POA: Diagnosis not present

## 2019-09-12 DIAGNOSIS — M48061 Spinal stenosis, lumbar region without neurogenic claudication: Secondary | ICD-10-CM | POA: Diagnosis not present

## 2019-09-12 DIAGNOSIS — M47816 Spondylosis without myelopathy or radiculopathy, lumbar region: Secondary | ICD-10-CM | POA: Insufficient documentation

## 2019-09-12 DIAGNOSIS — G8929 Other chronic pain: Secondary | ICD-10-CM | POA: Insufficient documentation

## 2019-09-12 DIAGNOSIS — M5442 Lumbago with sciatica, left side: Secondary | ICD-10-CM

## 2019-09-12 DIAGNOSIS — M5441 Lumbago with sciatica, right side: Secondary | ICD-10-CM | POA: Diagnosis not present

## 2019-09-12 DIAGNOSIS — G9589 Other specified diseases of spinal cord: Secondary | ICD-10-CM | POA: Diagnosis not present

## 2019-09-12 DIAGNOSIS — M5126 Other intervertebral disc displacement, lumbar region: Secondary | ICD-10-CM | POA: Diagnosis not present

## 2019-09-12 LAB — APTT: aPTT: 28 seconds (ref 24–36)

## 2019-09-12 LAB — PROTIME-INR
INR: 0.9 (ref 0.8–1.2)
Prothrombin Time: 11.4 seconds (ref 11.4–15.2)

## 2019-09-12 LAB — CBC
HCT: 38.3 % (ref 36.0–46.0)
Hemoglobin: 12.8 g/dL (ref 12.0–15.0)
MCH: 27.8 pg (ref 26.0–34.0)
MCHC: 33.4 g/dL (ref 30.0–36.0)
MCV: 83.3 fL (ref 80.0–100.0)
Platelets: 201 10*3/uL (ref 150–400)
RBC: 4.6 MIL/uL (ref 3.87–5.11)
RDW: 16.5 % — ABNORMAL HIGH (ref 11.5–15.5)
WBC: 8.2 10*3/uL (ref 4.0–10.5)
nRBC: 0 % (ref 0.0–0.2)

## 2019-09-12 MED ORDER — DIPHENHYDRAMINE HCL 50 MG/ML IJ SOLN
INTRAMUSCULAR | Status: AC
  Filled 2019-09-12: qty 1

## 2019-09-12 MED ORDER — IOHEXOL 180 MG/ML  SOLN
20.0000 mL | Freq: Once | INTRAMUSCULAR | Status: AC
  Administered 2019-09-12: 20 mL

## 2019-09-12 MED ORDER — ONDANSETRON 4 MG PO TBDP: 4.0000 mg | ORAL_TABLET | Freq: Once | ORAL | Status: AC

## 2019-09-12 MED ORDER — ONDANSETRON 4 MG PO TBDP
ORAL_TABLET | ORAL | Status: AC
  Administered 2019-09-12: 4 mg via ORAL
  Filled 2019-09-12: qty 1

## 2019-09-12 MED ORDER — SODIUM CHLORIDE (PF) 0.9 % IJ SOLN
10.0000 mL | Freq: Once | INTRAMUSCULAR | Status: AC
  Administered 2019-09-12: 10 mL

## 2019-09-12 NOTE — Progress Notes (Signed)
Patient doing well. Ready to go home. Denies any headache or worsened back pain. Given discharge instructions. Patient wheeled down to front. Placed in car with son's help.

## 2019-09-12 NOTE — Progress Notes (Signed)
Pt stable after myelogram.some nausea.Vss.Back stable.D/c instructions given.F/u with her MD.

## 2019-09-16 DIAGNOSIS — M5442 Lumbago with sciatica, left side: Secondary | ICD-10-CM | POA: Diagnosis not present

## 2019-09-16 DIAGNOSIS — M48061 Spinal stenosis, lumbar region without neurogenic claudication: Secondary | ICD-10-CM | POA: Diagnosis not present

## 2019-09-16 DIAGNOSIS — H903 Sensorineural hearing loss, bilateral: Secondary | ICD-10-CM | POA: Diagnosis not present

## 2019-09-16 DIAGNOSIS — G8929 Other chronic pain: Secondary | ICD-10-CM | POA: Diagnosis not present

## 2019-09-16 DIAGNOSIS — M5441 Lumbago with sciatica, right side: Secondary | ICD-10-CM | POA: Diagnosis not present

## 2019-09-16 DIAGNOSIS — H9121 Sudden idiopathic hearing loss, right ear: Secondary | ICD-10-CM | POA: Diagnosis not present

## 2019-09-23 DIAGNOSIS — M5442 Lumbago with sciatica, left side: Secondary | ICD-10-CM | POA: Diagnosis not present

## 2019-09-23 DIAGNOSIS — M5441 Lumbago with sciatica, right side: Secondary | ICD-10-CM | POA: Diagnosis not present

## 2019-09-30 DIAGNOSIS — R600 Localized edema: Secondary | ICD-10-CM | POA: Diagnosis not present

## 2019-10-01 DIAGNOSIS — R0600 Dyspnea, unspecified: Secondary | ICD-10-CM | POA: Diagnosis not present

## 2019-10-01 DIAGNOSIS — J439 Emphysema, unspecified: Secondary | ICD-10-CM | POA: Diagnosis not present

## 2019-10-03 ENCOUNTER — Ambulatory Visit
Admission: RE | Admit: 2019-10-03 | Discharge: 2019-10-03 | Disposition: A | Discharge status: Home / Self Care | Payer: PPO | Source: Ambulatory Visit | Attending: Family Medicine | Admitting: Family Medicine

## 2019-10-03 ENCOUNTER — Other Ambulatory Visit: Payer: Self-pay

## 2019-10-03 ENCOUNTER — Other Ambulatory Visit: Payer: Self-pay | Admitting: Family Medicine

## 2019-10-03 DIAGNOSIS — M79605 Pain in left leg: Secondary | ICD-10-CM

## 2019-10-03 DIAGNOSIS — R600 Localized edema: Secondary | ICD-10-CM | POA: Diagnosis not present

## 2019-10-07 DIAGNOSIS — M5442 Lumbago with sciatica, left side: Secondary | ICD-10-CM | POA: Diagnosis not present

## 2019-10-07 DIAGNOSIS — M48061 Spinal stenosis, lumbar region without neurogenic claudication: Secondary | ICD-10-CM | POA: Diagnosis not present

## 2019-10-07 DIAGNOSIS — M5441 Lumbago with sciatica, right side: Secondary | ICD-10-CM | POA: Diagnosis not present

## 2019-10-07 DIAGNOSIS — G8929 Other chronic pain: Secondary | ICD-10-CM | POA: Diagnosis not present

## 2019-10-10 DIAGNOSIS — M5441 Lumbago with sciatica, right side: Secondary | ICD-10-CM | POA: Diagnosis not present

## 2019-10-10 DIAGNOSIS — G8929 Other chronic pain: Secondary | ICD-10-CM | POA: Diagnosis not present

## 2019-10-10 DIAGNOSIS — M5442 Lumbago with sciatica, left side: Secondary | ICD-10-CM | POA: Diagnosis not present

## 2019-10-11 DIAGNOSIS — M7989 Other specified soft tissue disorders: Secondary | ICD-10-CM | POA: Diagnosis not present

## 2019-10-11 DIAGNOSIS — M25572 Pain in left ankle and joints of left foot: Secondary | ICD-10-CM | POA: Diagnosis not present

## 2019-10-11 DIAGNOSIS — M7732 Calcaneal spur, left foot: Secondary | ICD-10-CM | POA: Diagnosis not present

## 2019-10-11 DIAGNOSIS — M85872 Other specified disorders of bone density and structure, left ankle and foot: Secondary | ICD-10-CM | POA: Diagnosis not present

## 2019-10-22 DIAGNOSIS — M79662 Pain in left lower leg: Secondary | ICD-10-CM | POA: Diagnosis not present

## 2019-10-22 DIAGNOSIS — J4520 Mild intermittent asthma, uncomplicated: Secondary | ICD-10-CM | POA: Diagnosis not present

## 2019-10-22 DIAGNOSIS — K219 Gastro-esophageal reflux disease without esophagitis: Secondary | ICD-10-CM | POA: Diagnosis not present

## 2019-10-22 DIAGNOSIS — M19172 Post-traumatic osteoarthritis, left ankle and foot: Secondary | ICD-10-CM | POA: Diagnosis not present

## 2019-10-22 DIAGNOSIS — N3010 Interstitial cystitis (chronic) without hematuria: Secondary | ICD-10-CM | POA: Diagnosis not present

## 2019-10-22 DIAGNOSIS — R399 Unspecified symptoms and signs involving the genitourinary system: Secondary | ICD-10-CM | POA: Diagnosis not present

## 2019-10-22 DIAGNOSIS — Z6837 Body mass index (BMI) 37.0-37.9, adult: Secondary | ICD-10-CM | POA: Diagnosis not present

## 2019-10-22 DIAGNOSIS — M159 Polyosteoarthritis, unspecified: Secondary | ICD-10-CM | POA: Diagnosis not present

## 2019-10-22 DIAGNOSIS — F3340 Major depressive disorder, recurrent, in remission, unspecified: Secondary | ICD-10-CM | POA: Diagnosis not present

## 2019-10-22 DIAGNOSIS — R82998 Other abnormal findings in urine: Secondary | ICD-10-CM | POA: Diagnosis not present

## 2019-10-22 DIAGNOSIS — N343 Urethral syndrome, unspecified: Secondary | ICD-10-CM | POA: Diagnosis not present

## 2019-10-22 DIAGNOSIS — Z9889 Other specified postprocedural states: Secondary | ICD-10-CM | POA: Diagnosis not present

## 2019-10-22 DIAGNOSIS — R8290 Unspecified abnormal findings in urine: Secondary | ICD-10-CM | POA: Diagnosis not present

## 2019-10-22 DIAGNOSIS — I872 Venous insufficiency (chronic) (peripheral): Secondary | ICD-10-CM | POA: Diagnosis not present

## 2019-10-22 DIAGNOSIS — D649 Anemia, unspecified: Secondary | ICD-10-CM | POA: Diagnosis not present

## 2019-10-24 DIAGNOSIS — M48061 Spinal stenosis, lumbar region without neurogenic claudication: Secondary | ICD-10-CM | POA: Diagnosis not present

## 2019-10-24 DIAGNOSIS — M5442 Lumbago with sciatica, left side: Secondary | ICD-10-CM | POA: Diagnosis not present

## 2019-10-24 DIAGNOSIS — M5441 Lumbago with sciatica, right side: Secondary | ICD-10-CM | POA: Diagnosis not present

## 2019-10-24 DIAGNOSIS — G8929 Other chronic pain: Secondary | ICD-10-CM | POA: Diagnosis not present

## 2019-10-29 DIAGNOSIS — Z9889 Other specified postprocedural states: Secondary | ICD-10-CM | POA: Diagnosis not present

## 2019-10-29 DIAGNOSIS — I872 Venous insufficiency (chronic) (peripheral): Secondary | ICD-10-CM | POA: Diagnosis not present

## 2019-10-29 DIAGNOSIS — R262 Difficulty in walking, not elsewhere classified: Secondary | ICD-10-CM | POA: Insufficient documentation

## 2019-10-29 DIAGNOSIS — F419 Anxiety disorder, unspecified: Secondary | ICD-10-CM | POA: Diagnosis not present

## 2019-10-29 DIAGNOSIS — M5441 Lumbago with sciatica, right side: Secondary | ICD-10-CM | POA: Diagnosis not present

## 2019-10-29 DIAGNOSIS — F413 Other mixed anxiety disorders: Secondary | ICD-10-CM | POA: Diagnosis not present

## 2019-10-29 DIAGNOSIS — K219 Gastro-esophageal reflux disease without esophagitis: Secondary | ICD-10-CM | POA: Diagnosis not present

## 2019-10-29 DIAGNOSIS — G8929 Other chronic pain: Secondary | ICD-10-CM | POA: Diagnosis not present

## 2019-10-29 DIAGNOSIS — Z6841 Body Mass Index (BMI) 40.0 and over, adult: Secondary | ICD-10-CM | POA: Diagnosis not present

## 2019-10-29 DIAGNOSIS — M5442 Lumbago with sciatica, left side: Secondary | ICD-10-CM | POA: Diagnosis not present

## 2019-10-29 DIAGNOSIS — M79662 Pain in left lower leg: Secondary | ICD-10-CM | POA: Diagnosis not present

## 2019-10-29 DIAGNOSIS — J4520 Mild intermittent asthma, uncomplicated: Secondary | ICD-10-CM | POA: Diagnosis not present

## 2019-10-29 DIAGNOSIS — R7309 Other abnormal glucose: Secondary | ICD-10-CM | POA: Diagnosis not present

## 2019-11-06 DIAGNOSIS — M25552 Pain in left hip: Secondary | ICD-10-CM | POA: Diagnosis not present

## 2019-11-06 DIAGNOSIS — M545 Low back pain: Secondary | ICD-10-CM | POA: Diagnosis not present

## 2019-11-06 DIAGNOSIS — M25551 Pain in right hip: Secondary | ICD-10-CM | POA: Diagnosis not present

## 2019-11-08 DIAGNOSIS — M25552 Pain in left hip: Secondary | ICD-10-CM | POA: Diagnosis not present

## 2019-11-08 DIAGNOSIS — M545 Low back pain: Secondary | ICD-10-CM | POA: Diagnosis not present

## 2019-11-08 DIAGNOSIS — M25551 Pain in right hip: Secondary | ICD-10-CM | POA: Diagnosis not present

## 2019-11-13 DIAGNOSIS — M25552 Pain in left hip: Secondary | ICD-10-CM | POA: Diagnosis not present

## 2019-11-13 DIAGNOSIS — M25551 Pain in right hip: Secondary | ICD-10-CM | POA: Diagnosis not present

## 2019-11-13 DIAGNOSIS — M545 Low back pain: Secondary | ICD-10-CM | POA: Diagnosis not present

## 2019-11-14 DIAGNOSIS — M7989 Other specified soft tissue disorders: Secondary | ICD-10-CM | POA: Diagnosis not present

## 2019-11-15 DIAGNOSIS — M545 Low back pain: Secondary | ICD-10-CM | POA: Diagnosis not present

## 2019-11-15 DIAGNOSIS — M25552 Pain in left hip: Secondary | ICD-10-CM | POA: Diagnosis not present

## 2019-11-15 DIAGNOSIS — M25551 Pain in right hip: Secondary | ICD-10-CM | POA: Diagnosis not present

## 2019-11-19 DIAGNOSIS — M25552 Pain in left hip: Secondary | ICD-10-CM | POA: Diagnosis not present

## 2019-11-19 DIAGNOSIS — M545 Low back pain: Secondary | ICD-10-CM | POA: Diagnosis not present

## 2019-11-19 DIAGNOSIS — M25551 Pain in right hip: Secondary | ICD-10-CM | POA: Diagnosis not present

## 2019-11-22 DIAGNOSIS — M25551 Pain in right hip: Secondary | ICD-10-CM | POA: Diagnosis not present

## 2019-11-22 DIAGNOSIS — M25552 Pain in left hip: Secondary | ICD-10-CM | POA: Diagnosis not present

## 2019-11-22 DIAGNOSIS — M545 Low back pain: Secondary | ICD-10-CM | POA: Diagnosis not present

## 2019-11-26 DIAGNOSIS — E559 Vitamin D deficiency, unspecified: Secondary | ICD-10-CM | POA: Diagnosis not present

## 2019-11-26 DIAGNOSIS — M545 Low back pain: Secondary | ICD-10-CM | POA: Diagnosis not present

## 2019-11-26 DIAGNOSIS — M25552 Pain in left hip: Secondary | ICD-10-CM | POA: Diagnosis not present

## 2019-11-26 DIAGNOSIS — M810 Age-related osteoporosis without current pathological fracture: Secondary | ICD-10-CM | POA: Diagnosis not present

## 2019-11-26 DIAGNOSIS — M25551 Pain in right hip: Secondary | ICD-10-CM | POA: Diagnosis not present

## 2019-11-26 DIAGNOSIS — R7989 Other specified abnormal findings of blood chemistry: Secondary | ICD-10-CM | POA: Diagnosis not present

## 2019-11-29 DIAGNOSIS — M25552 Pain in left hip: Secondary | ICD-10-CM | POA: Diagnosis not present

## 2019-11-29 DIAGNOSIS — M25551 Pain in right hip: Secondary | ICD-10-CM | POA: Diagnosis not present

## 2019-11-29 DIAGNOSIS — M545 Low back pain: Secondary | ICD-10-CM | POA: Diagnosis not present

## 2019-12-01 DIAGNOSIS — J45901 Unspecified asthma with (acute) exacerbation: Secondary | ICD-10-CM | POA: Diagnosis not present

## 2019-12-03 DIAGNOSIS — M545 Low back pain: Secondary | ICD-10-CM | POA: Diagnosis not present

## 2019-12-03 DIAGNOSIS — M25551 Pain in right hip: Secondary | ICD-10-CM | POA: Diagnosis not present

## 2019-12-03 DIAGNOSIS — M25552 Pain in left hip: Secondary | ICD-10-CM | POA: Diagnosis not present

## 2019-12-05 DIAGNOSIS — Z90710 Acquired absence of both cervix and uterus: Secondary | ICD-10-CM | POA: Diagnosis not present

## 2019-12-05 DIAGNOSIS — E785 Hyperlipidemia, unspecified: Secondary | ICD-10-CM | POA: Diagnosis not present

## 2019-12-05 DIAGNOSIS — Z882 Allergy status to sulfonamides status: Secondary | ICD-10-CM | POA: Diagnosis not present

## 2019-12-05 DIAGNOSIS — M1990 Unspecified osteoarthritis, unspecified site: Secondary | ICD-10-CM | POA: Diagnosis not present

## 2019-12-05 DIAGNOSIS — M25551 Pain in right hip: Secondary | ICD-10-CM | POA: Diagnosis not present

## 2019-12-05 DIAGNOSIS — R350 Frequency of micturition: Secondary | ICD-10-CM | POA: Diagnosis not present

## 2019-12-05 DIAGNOSIS — N390 Urinary tract infection, site not specified: Secondary | ICD-10-CM | POA: Diagnosis not present

## 2019-12-05 DIAGNOSIS — N3010 Interstitial cystitis (chronic) without hematuria: Secondary | ICD-10-CM | POA: Diagnosis not present

## 2019-12-05 DIAGNOSIS — M545 Low back pain: Secondary | ICD-10-CM | POA: Diagnosis not present

## 2019-12-05 DIAGNOSIS — Z9049 Acquired absence of other specified parts of digestive tract: Secondary | ICD-10-CM | POA: Diagnosis not present

## 2019-12-05 DIAGNOSIS — Z885 Allergy status to narcotic agent status: Secondary | ICD-10-CM | POA: Diagnosis not present

## 2019-12-05 DIAGNOSIS — Z7951 Long term (current) use of inhaled steroids: Secondary | ICD-10-CM | POA: Diagnosis not present

## 2019-12-05 DIAGNOSIS — N3941 Urge incontinence: Secondary | ICD-10-CM | POA: Diagnosis not present

## 2019-12-05 DIAGNOSIS — Z880 Allergy status to penicillin: Secondary | ICD-10-CM | POA: Diagnosis not present

## 2019-12-05 DIAGNOSIS — Z881 Allergy status to other antibiotic agents status: Secondary | ICD-10-CM | POA: Diagnosis not present

## 2019-12-05 DIAGNOSIS — Z888 Allergy status to other drugs, medicaments and biological substances status: Secondary | ICD-10-CM | POA: Diagnosis not present

## 2019-12-05 DIAGNOSIS — K219 Gastro-esophageal reflux disease without esophagitis: Secondary | ICD-10-CM | POA: Diagnosis not present

## 2019-12-05 DIAGNOSIS — Z7982 Long term (current) use of aspirin: Secondary | ICD-10-CM | POA: Diagnosis not present

## 2019-12-05 DIAGNOSIS — R351 Nocturia: Secondary | ICD-10-CM | POA: Diagnosis not present

## 2019-12-05 DIAGNOSIS — M25552 Pain in left hip: Secondary | ICD-10-CM | POA: Diagnosis not present

## 2019-12-05 DIAGNOSIS — Z960 Presence of urogenital implants: Secondary | ICD-10-CM | POA: Diagnosis not present

## 2019-12-09 DIAGNOSIS — R350 Frequency of micturition: Secondary | ICD-10-CM | POA: Diagnosis not present

## 2019-12-09 DIAGNOSIS — R262 Difficulty in walking, not elsewhere classified: Secondary | ICD-10-CM | POA: Diagnosis not present

## 2019-12-09 DIAGNOSIS — F413 Other mixed anxiety disorders: Secondary | ICD-10-CM | POA: Diagnosis not present

## 2019-12-09 DIAGNOSIS — I358 Other nonrheumatic aortic valve disorders: Secondary | ICD-10-CM | POA: Diagnosis not present

## 2019-12-09 DIAGNOSIS — N3010 Interstitial cystitis (chronic) without hematuria: Secondary | ICD-10-CM | POA: Diagnosis not present

## 2019-12-09 DIAGNOSIS — R140 Abdominal distension (gaseous): Secondary | ICD-10-CM | POA: Diagnosis not present

## 2019-12-09 DIAGNOSIS — R600 Localized edema: Secondary | ICD-10-CM | POA: Diagnosis not present

## 2019-12-10 DIAGNOSIS — M25551 Pain in right hip: Secondary | ICD-10-CM | POA: Diagnosis not present

## 2019-12-10 DIAGNOSIS — M25552 Pain in left hip: Secondary | ICD-10-CM | POA: Diagnosis not present

## 2019-12-12 ENCOUNTER — Other Ambulatory Visit: Payer: Self-pay

## 2019-12-12 ENCOUNTER — Encounter (INDEPENDENT_AMBULATORY_CARE_PROVIDER_SITE_OTHER): Payer: Self-pay | Admitting: Vascular Surgery

## 2019-12-12 ENCOUNTER — Ambulatory Visit (INDEPENDENT_AMBULATORY_CARE_PROVIDER_SITE_OTHER): Payer: PPO | Admitting: Vascular Surgery

## 2019-12-12 VITALS — BP 164/81 | HR 92 | Resp 16 | Ht 59.0 in | Wt 198.6 lb

## 2019-12-12 DIAGNOSIS — J45909 Unspecified asthma, uncomplicated: Secondary | ICD-10-CM | POA: Diagnosis not present

## 2019-12-12 DIAGNOSIS — I872 Venous insufficiency (chronic) (peripheral): Secondary | ICD-10-CM | POA: Diagnosis not present

## 2019-12-12 DIAGNOSIS — M159 Polyosteoarthritis, unspecified: Secondary | ICD-10-CM | POA: Diagnosis not present

## 2019-12-12 DIAGNOSIS — I890 Lymphedema, not elsewhere classified: Secondary | ICD-10-CM | POA: Diagnosis not present

## 2019-12-12 DIAGNOSIS — S22080A Wedge compression fracture of T11-T12 vertebra, initial encounter for closed fracture: Secondary | ICD-10-CM | POA: Diagnosis not present

## 2019-12-12 DIAGNOSIS — E785 Hyperlipidemia, unspecified: Secondary | ICD-10-CM | POA: Diagnosis not present

## 2019-12-12 NOTE — Progress Notes (Signed)
MRN : [id number]  Amber Kirk is a 85 y.o. ([birth year]) female who presents with chief complaint of  Chief Complaint  Patient presents with  . New Patient (Initial Visit)    ref Hande persistent bil leg edema  .  History of Present Illness:  The patient presents to the office for followup evaluation regarding leg swelling.  The swelling has persisted and the pain associated with swelling continues. There have not been any interval development of a ulcerations or wounds.  Since the previous visit the patient has been wearing graduated compression stockings and has noted little if any improvement in the lymphedema. The patient has been using compression routinely morning until night.  The patient also states elevation during the day and exercise is being done too.  Since her last visit more than 3 years ago she is sustained multiple compression fractures of her spine.  This has been a very difficult problem for her to deal with.  Furthermore her arthritis continues to progress in her mobility is diminished.  She has also been dealing with some congestive heart issues as well.  This is been treated with diuretics.   Current Meds  Medication Sig  . acetaminophen (TYLENOL) 500 MG tablet Take 1,000 mg by mouth every 6 (six) hours as needed.  Marland Kitchen albuterol (VENTOLIN HFA) 108 (90 Base) MCG/ACT inhaler Inhale 1-2 puffs into the lungs every 6 (six) hours as needed for wheezing or shortness of breath.  Marland Kitchen alendronate (FOSAMAX) 70 MG tablet Take 70 mg by mouth once a week. Take with a full glass of water on an empty stomach.  . Aloe Vera Freeze Dried POWD Take 3 capsules by mouth in the morning and at bedtime.  Marland Kitchen aspirin EC 81 MG tablet Take 81 mg by mouth daily.   Marland Kitchen azelastine (ASTELIN) 0.1 % nasal spray Place 1 spray into both nostrils 2 (two) times daily. Use in each nostril as directed  . Calcium Carbonate (CALCIUM 500 PO) Take 500 mg by mouth daily.  . cetirizine (ZYRTEC) 10 MG  tablet Take 10 mg by mouth every morning.   . cholecalciferol (VITAMIN D) 25 MCG (1000 UNIT) tablet Take 1,000 Units by mouth daily.  . diclofenac Sodium (VOLTAREN) 1 % GEL Apply topically 4 (four) times daily.  Marland Kitchen docusate sodium (COLACE) 100 MG capsule Take 1 capsule (100 mg total) by mouth 2 (two) times daily.  Marland Kitchen doxepin (SINEQUAN) 25 MG capsule Take 25 mg by mouth at bedtime.   Marland Kitchen estradiol (ESTRACE) 0.1 MG/GM vaginal cream Place 1 Applicatorful vaginally 3 (three) times a week.  . fluticasone (FLONASE) 50 MCG/ACT nasal spray Place 1 spray into both nostrils in the morning and at bedtime.   . hydroxychloroquine (PLAQUENIL) 200 MG tablet Take 100 mg by mouth daily.   Marland Kitchen LINZESS 145 MCG CAPS capsule Take 145 mcg by mouth daily as needed (constipation.).   Marland Kitchen lovastatin (MEVACOR) 20 MG tablet Take 20 mg by mouth daily with supper.   . montelukast (SINGULAIR) 10 MG tablet Take 10 mg by mouth every evening.   . Multiple Vitamins-Minerals (ADULT GUMMY PO) Take 2 tablets by mouth daily. Vitafusion Women's  . olopatadine (PATADAY) 0.1 % ophthalmic solution Place 1 drop into both eyes 2 (two) times daily as needed for allergies.   Marland Kitchen omeprazole (PRILOSEC) 20 MG capsule Take 20 mg by mouth in the morning and at bedtime.   . ondansetron (ZOFRAN-ODT) 4 MG disintegrating tablet Take 4 mg by mouth every 8 (  eight) hours as needed for nausea/vomiting.  . polyethylene glycol (MIRALAX / GLYCOLAX) 17 g packet Take 17 g by mouth 2 (two) times daily.  . Red Yeast Rice 600 MG CAPS Take by mouth daily.  . SYMBICORT 160-4.5 MCG/ACT inhaler Inhale 2 puffs into the lungs in the morning and at bedtime.   . torsemide (DEMADEX) 10 MG tablet Take 10 mg by mouth daily.  . trospium (SANCTURA) 20 MG tablet Take 20 mg by mouth at bedtime.    Past Medical History:  Diagnosis Date  . Allergy   . Asthma   . Bowel obstruction ([place]) 06/2019  . Complication of anesthesia   . COPD (chronic obstructive pulmonary disease) ([place])    . GERD (gastroesophageal reflux disease)   . Heart murmur   . Interstitial cystitis   . PONV (postoperative nausea and vomiting)    after 1st cataract surgery  . Sleep apnea    mild-no cpap     Past Surgical History:  Procedure Laterality Date  . ABDOMINAL HYSTERECTOMY    . APPENDECTOMY    . BLADDER SURGERY    . CHOLECYSTECTOMY    . EYE SURGERY     bil cataracts  . KYPHOPLASTY N/A 07/05/2019   Procedure: T12 KYPHOPLASTY;  Surgeon: Hessie Knows, MD;  Location: [facility] ORS;  Service: Orthopedics;  Laterality: N/A;  . KYPHOPLASTY N/A 07/23/2019   Procedure: L1 KYPHOPLASTY;  Surgeon: Hessie Knows, MD;  Location: [facility] ORS;  Service: Orthopedics;  Laterality: N/A;  . TONSILLECTOMY      Social History Social History   Tobacco Use  . Smoking status: Never Smoker  . Smokeless tobacco: Never Used  Vaping Use  . Vaping Use: Never used  Substance Use Topics  . Alcohol use: No  . Drug use: No    Family History Family History  Problem Relation Age of Onset  . Heart disease Mother   . Stroke Father   . Breast cancer Neg Hx     Allergies  Allergen Reactions  . Penicillins Hives, Rash and Other (See Comments)    And itching  . Morphine And Related Nausea And Vomiting and Nausea Only  . Chlorzoxazone Nausea And Vomiting  . Ciprofloxacin Nausea And Vomiting  . Clarithromycin Nausea And Vomiting  . Clindamycin Hcl Nausea And Vomiting  . Codeine Sulfate Nausea And Vomiting  . Conjugated Estrogens Nausea And Vomiting  . Hydrocodone-Homatropine Nausea And Vomiting  . Hydromorphone Nausea And Vomiting  . Lidocaine Nausea And Vomiting  . Meperidine Nausea And Vomiting  . Propoxyphene Nausea And Vomiting  . Sulfa Antibiotics Nausea And Vomiting  . Tioconazole Nausea Only     REVIEW OF SYSTEMS (Negative unless checked)  Constitutional: [] Weight loss  [] Fever  [] Chills Cardiac: [] Chest pain   [] Chest pressure   [] Palpitations   [] Shortness of breath when laying flat    [] Shortness of breath with exertion. Vascular:  [] Pain in legs with walking   [x] Pain in legs at rest  [] History of DVT   [] Phlebitis   [x] Swelling in legs   [] Varicose veins   [] Non-healing ulcers Pulmonary:   [] Uses home oxygen   [] Productive cough   [] Hemoptysis   [] Wheeze  [] COPD   [x] Asthma Neurologic:  [] Dizziness   [] Seizures   [] History of stroke   [] History of TIA  [] Aphasia   [] Vissual changes   [] Weakness or numbness in arm   [] Weakness or numbness in leg Musculoskeletal:   [] Joint swelling   [x] Joint pain   [x] Low back pain Hematologic:  []   Easy bruising  [] Easy bleeding   [] Hypercoagulable state   [] Anemic Gastrointestinal:  [] Diarrhea   [] Vomiting  [] Gastroesophageal reflux/heartburn   [] Difficulty swallowing. Genitourinary:  [] Chronic kidney disease   [] Difficult urination  [] Frequent urination   [] Blood in urine Skin:  [] Rashes   [] Ulcers  Psychological:  [] History of anxiety   []  History of major depression.  Physical Examination  Vitals:   12/12/19 0944  BP: (!) 164/81  Pulse: 92  Resp: 16  Weight: 198 lb 9.6 oz (90.1 kg)  Height: 4\' 11"  (1.499 m)   Body mass index is 40.11 kg/m. Gen: WD/WN, NAD Head: [place]/AT, No temporalis wasting.  Ear/Nose/Throat: Hearing grossly intact, nares w/o erythema or drainage Eyes: PER, EOMI, sclera nonicteric.  Neck: Supple, no large masses.   Pulmonary:  Good air movement, no audible wheezing bilaterally, no use of accessory muscles.  Cardiac: RRR, no JVD Vascular:  2-3+ soft pitting edema Vessel Right Left  Radial Palpable Palpable  Gastrointestinal: Non-distended. No guarding/no peritoneal signs.  Musculoskeletal: M/S 5/5 throughout.  No deformity or atrophy.  Neurologic: CN 2-12 intact. Symmetrical.  Speech is fluent. Motor exam as listed above. Psychiatric: Judgment intact, Mood & affect appropriate for pt's clinical situation. Dermatologic: venous rashes no ulcers noted.  No changes consistent with cellulitis. Lymph : No  lichenification or skin changes of chronic lymphedema.  CBC Lab Results  Component Value Date   WBC 8.2 09/12/2019   HGB 12.8 09/12/2019   HCT 38.3 09/12/2019   MCV 83.3 09/12/2019   PLT 201 09/12/2019    BMET    Component Value Date/Time   NA 133 (L) 07/15/2019 1532   NA 139 06/22/2013 2023   K 4.6 07/15/2019 1532   K 3.9 06/22/2013 2023   CL 95 (L) 07/15/2019 1532   CL 103 06/22/2013 2023   CO2 28 07/15/2019 1532   CO2 29 06/22/2013 2023   GLUCOSE 115 (H) 07/15/2019 1532   GLUCOSE 124 (H) 06/22/2013 2023   BUN 16 07/15/2019 1532   BUN 16 06/22/2013 2023   CREATININE 0.58 07/15/2019 1532   CREATININE 0.91 06/22/2013 2023   CALCIUM 9.6 07/15/2019 1532   CALCIUM 9.3 06/22/2013 2023   GFRNONAA >60 07/15/2019 1532   GFRNONAA >60 06/22/2013 2023   GFRAA >60 07/15/2019 1532   GFRAA >60 06/22/2013 2023   CrCl cannot be calculated (Patient's most recent lab result is older than the maximum 21 days allowed.).  COAG Lab Results  Component Value Date   INR 0.9 09/12/2019    Radiology No results found.    Assessment/Plan 1. Lymphedema Recommend:  No surgery or intervention at this point in time.    I have reviewed my previous discussion with the patient regarding swelling and why it causes symptoms.  Patient will continue wearing graduated compression stockings class 1 (20-30 mmHg) on a daily basis. The patient will  beginning wearing the stockings first thing in the morning and removing them in the evening. The patient is instructed specifically not to sleep in the stockings.    In addition, behavioral modification including several periods of elevation of the lower extremities during the day will be continued.  This was reviewed with the patient during the initial visit.  The patient will also continue routine exercise, especially walking on a daily basis as was discussed during the initial visit.    Despite conservative treatments including graduated compression  therapy class 1 and behavioral modification including exercise and elevation the patient  has not obtained adequate  control of the lymphedema.  The patient still has stage 3 lymphedema and therefore, I believe that a lymph pump should be added to improve the control of the patient's lymphedema.  Additionally, a lymph pump is warranted because it will reduce the risk of cellulitis and ulceration in the future.  Patient should follow-up in six months    2. Chronic venous insufficiency Recommend:  No surgery or intervention at this point in time.    I have reviewed my previous discussion with the patient regarding swelling and why it causes symptoms.  Patient will continue wearing graduated compression stockings class 1 (20-30 mmHg) on a daily basis. The patient will  beginning wearing the stockings first thing in the morning and removing them in the evening. The patient is instructed specifically not to sleep in the stockings.    In addition, behavioral modification including several periods of elevation of the lower extremities during the day will be continued.  This was reviewed with the patient during the initial visit.  The patient will also continue routine exercise, especially walking on a daily basis as was discussed during the initial visit.    Despite conservative treatments including graduated compression therapy class 1 and behavioral modification including exercise and elevation the patient  has not obtained adequate control of the lymphedema.  The patient still has stage 3 lymphedema and therefore, I believe that a lymph pump should be added to improve the control of the patient's lymphedema.  Additionally, a lymph pump is warranted because it will reduce the risk of cellulitis and ulceration in the future.  Patient should follow-up in six months    3. Asthma, unspecified asthma severity, unspecified whether complicated, unspecified whether persistent Continue pulmonary medications  and aerosols as already ordered, these medications have been reviewed and there are no changes at this time.    4. Generalized osteoarthritis of multiple sites I believe this is contributing significantly to her leg pain.  Continue NSAID medications as already ordered, these medications have been reviewed and there are no changes at this time.  Continued activity and therapy was stressed.   5. Hyperlipidemia, unspecified hyperlipidemia type Continue statin as ordered and reviewed, no changes at this time   6. Compression fracture of T12 vertebra, initial encounter ([place]) Again, I believe this is contributing significantly to her leg pain.  Continue NSAID medications as already ordered, these medications have been reviewed and there are no changes at this time.  Continued activity and therapy was stressed.    Hortencia Pilar, MD  12/12/2019 11:21 AM

## 2019-12-13 DIAGNOSIS — M25552 Pain in left hip: Secondary | ICD-10-CM | POA: Diagnosis not present

## 2019-12-13 DIAGNOSIS — M25551 Pain in right hip: Secondary | ICD-10-CM | POA: Diagnosis not present

## 2019-12-17 DIAGNOSIS — M25552 Pain in left hip: Secondary | ICD-10-CM | POA: Diagnosis not present

## 2019-12-17 DIAGNOSIS — M25551 Pain in right hip: Secondary | ICD-10-CM | POA: Diagnosis not present

## 2019-12-18 DIAGNOSIS — M0579 Rheumatoid arthritis with rheumatoid factor of multiple sites without organ or systems involvement: Secondary | ICD-10-CM | POA: Diagnosis not present

## 2019-12-18 DIAGNOSIS — M810 Age-related osteoporosis without current pathological fracture: Secondary | ICD-10-CM | POA: Diagnosis not present

## 2019-12-18 DIAGNOSIS — E559 Vitamin D deficiency, unspecified: Secondary | ICD-10-CM | POA: Diagnosis not present

## 2019-12-20 DIAGNOSIS — M25551 Pain in right hip: Secondary | ICD-10-CM | POA: Diagnosis not present

## 2019-12-20 DIAGNOSIS — M545 Low back pain, unspecified: Secondary | ICD-10-CM | POA: Diagnosis not present

## 2019-12-20 DIAGNOSIS — M25552 Pain in left hip: Secondary | ICD-10-CM | POA: Diagnosis not present

## 2019-12-24 DIAGNOSIS — M25551 Pain in right hip: Secondary | ICD-10-CM | POA: Diagnosis not present

## 2019-12-24 DIAGNOSIS — M25552 Pain in left hip: Secondary | ICD-10-CM | POA: Diagnosis not present

## 2019-12-26 DIAGNOSIS — M0579 Rheumatoid arthritis with rheumatoid factor of multiple sites without organ or systems involvement: Secondary | ICD-10-CM | POA: Diagnosis not present

## 2019-12-26 DIAGNOSIS — M8949 Other hypertrophic osteoarthropathy, multiple sites: Secondary | ICD-10-CM | POA: Diagnosis not present

## 2019-12-26 DIAGNOSIS — M8000XD Age-related osteoporosis with current pathological fracture, unspecified site, subsequent encounter for fracture with routine healing: Secondary | ICD-10-CM | POA: Diagnosis not present

## 2019-12-26 DIAGNOSIS — R609 Edema, unspecified: Secondary | ICD-10-CM | POA: Diagnosis not present

## 2019-12-27 DIAGNOSIS — M25552 Pain in left hip: Secondary | ICD-10-CM | POA: Diagnosis not present

## 2019-12-27 DIAGNOSIS — M25551 Pain in right hip: Secondary | ICD-10-CM | POA: Diagnosis not present

## 2019-12-31 DIAGNOSIS — M25551 Pain in right hip: Secondary | ICD-10-CM | POA: Diagnosis not present

## 2019-12-31 DIAGNOSIS — M25552 Pain in left hip: Secondary | ICD-10-CM | POA: Diagnosis not present

## 2020-01-03 DIAGNOSIS — M25551 Pain in right hip: Secondary | ICD-10-CM | POA: Diagnosis not present

## 2020-01-03 DIAGNOSIS — M25552 Pain in left hip: Secondary | ICD-10-CM | POA: Diagnosis not present

## 2020-01-07 DIAGNOSIS — M25551 Pain in right hip: Secondary | ICD-10-CM | POA: Diagnosis not present

## 2020-01-07 DIAGNOSIS — M25552 Pain in left hip: Secondary | ICD-10-CM | POA: Diagnosis not present

## 2020-01-07 DIAGNOSIS — I890 Lymphedema, not elsewhere classified: Secondary | ICD-10-CM | POA: Diagnosis not present

## 2020-01-10 DIAGNOSIS — M25552 Pain in left hip: Secondary | ICD-10-CM | POA: Diagnosis not present

## 2020-01-10 DIAGNOSIS — M25551 Pain in right hip: Secondary | ICD-10-CM | POA: Diagnosis not present

## 2020-01-14 DIAGNOSIS — M25552 Pain in left hip: Secondary | ICD-10-CM | POA: Diagnosis not present

## 2020-01-14 DIAGNOSIS — M25551 Pain in right hip: Secondary | ICD-10-CM | POA: Diagnosis not present

## 2020-01-17 DIAGNOSIS — M25551 Pain in right hip: Secondary | ICD-10-CM | POA: Diagnosis not present

## 2020-01-17 DIAGNOSIS — M25552 Pain in left hip: Secondary | ICD-10-CM | POA: Diagnosis not present

## 2020-01-21 DIAGNOSIS — M25551 Pain in right hip: Secondary | ICD-10-CM | POA: Diagnosis not present

## 2020-01-21 DIAGNOSIS — M25552 Pain in left hip: Secondary | ICD-10-CM | POA: Diagnosis not present

## 2020-01-23 DIAGNOSIS — M810 Age-related osteoporosis without current pathological fracture: Secondary | ICD-10-CM | POA: Diagnosis not present

## 2020-01-24 ENCOUNTER — Telehealth (INDEPENDENT_AMBULATORY_CARE_PROVIDER_SITE_OTHER): Payer: Self-pay

## 2020-01-24 DIAGNOSIS — M5442 Lumbago with sciatica, left side: Secondary | ICD-10-CM | POA: Diagnosis not present

## 2020-01-24 DIAGNOSIS — M48061 Spinal stenosis, lumbar region without neurogenic claudication: Secondary | ICD-10-CM | POA: Diagnosis not present

## 2020-01-24 DIAGNOSIS — M5441 Lumbago with sciatica, right side: Secondary | ICD-10-CM | POA: Diagnosis not present

## 2020-01-24 NOTE — Telephone Encounter (Addendum)
I attempted to contact the patient at her home number and was unable to leave a message. I contacted the mobile number and a message was left for a return call. Patient understood.

## 2020-01-27 NOTE — Telephone Encounter (Signed)
Patient called again today to ask how long will she need to use her compression pump. I explained that she will need to use her pump for lifetime to help with the swelling in her legs perFallon Owens Shark NP.

## 2020-01-28 DIAGNOSIS — M25551 Pain in right hip: Secondary | ICD-10-CM | POA: Diagnosis not present

## 2020-01-28 DIAGNOSIS — M545 Low back pain, unspecified: Secondary | ICD-10-CM | POA: Diagnosis not present

## 2020-01-28 DIAGNOSIS — M25552 Pain in left hip: Secondary | ICD-10-CM | POA: Diagnosis not present

## 2020-01-31 DIAGNOSIS — M545 Low back pain, unspecified: Secondary | ICD-10-CM | POA: Diagnosis not present

## 2020-01-31 DIAGNOSIS — M25552 Pain in left hip: Secondary | ICD-10-CM | POA: Diagnosis not present

## 2020-01-31 DIAGNOSIS — M25551 Pain in right hip: Secondary | ICD-10-CM | POA: Diagnosis not present

## 2020-02-04 DIAGNOSIS — M25552 Pain in left hip: Secondary | ICD-10-CM | POA: Diagnosis not present

## 2020-02-04 DIAGNOSIS — M545 Low back pain, unspecified: Secondary | ICD-10-CM | POA: Diagnosis not present

## 2020-02-04 DIAGNOSIS — M25551 Pain in right hip: Secondary | ICD-10-CM | POA: Diagnosis not present

## 2020-02-06 DIAGNOSIS — J449 Chronic obstructive pulmonary disease, unspecified: Secondary | ICD-10-CM | POA: Diagnosis not present

## 2020-02-06 DIAGNOSIS — J310 Chronic rhinitis: Secondary | ICD-10-CM | POA: Diagnosis not present

## 2020-02-06 DIAGNOSIS — R0600 Dyspnea, unspecified: Secondary | ICD-10-CM | POA: Diagnosis not present

## 2020-02-07 DIAGNOSIS — M48061 Spinal stenosis, lumbar region without neurogenic claudication: Secondary | ICD-10-CM | POA: Diagnosis not present

## 2020-02-07 DIAGNOSIS — M5442 Lumbago with sciatica, left side: Secondary | ICD-10-CM | POA: Diagnosis not present

## 2020-02-07 DIAGNOSIS — G8929 Other chronic pain: Secondary | ICD-10-CM | POA: Diagnosis not present

## 2020-02-07 DIAGNOSIS — M25551 Pain in right hip: Secondary | ICD-10-CM | POA: Diagnosis not present

## 2020-02-07 DIAGNOSIS — M25552 Pain in left hip: Secondary | ICD-10-CM | POA: Diagnosis not present

## 2020-02-07 DIAGNOSIS — M545 Low back pain, unspecified: Secondary | ICD-10-CM | POA: Diagnosis not present

## 2020-02-07 DIAGNOSIS — M5441 Lumbago with sciatica, right side: Secondary | ICD-10-CM | POA: Diagnosis not present

## 2020-02-11 DIAGNOSIS — M545 Low back pain, unspecified: Secondary | ICD-10-CM | POA: Diagnosis not present

## 2020-02-11 DIAGNOSIS — M25552 Pain in left hip: Secondary | ICD-10-CM | POA: Diagnosis not present

## 2020-02-11 DIAGNOSIS — M25551 Pain in right hip: Secondary | ICD-10-CM | POA: Diagnosis not present

## 2020-02-14 DIAGNOSIS — M5441 Lumbago with sciatica, right side: Secondary | ICD-10-CM | POA: Diagnosis not present

## 2020-02-14 DIAGNOSIS — R7309 Other abnormal glucose: Secondary | ICD-10-CM | POA: Diagnosis not present

## 2020-02-14 DIAGNOSIS — R262 Difficulty in walking, not elsewhere classified: Secondary | ICD-10-CM | POA: Diagnosis not present

## 2020-02-14 DIAGNOSIS — Z9889 Other specified postprocedural states: Secondary | ICD-10-CM | POA: Diagnosis not present

## 2020-02-14 DIAGNOSIS — F413 Other mixed anxiety disorders: Secondary | ICD-10-CM | POA: Diagnosis not present

## 2020-02-14 DIAGNOSIS — M5442 Lumbago with sciatica, left side: Secondary | ICD-10-CM | POA: Diagnosis not present

## 2020-02-14 DIAGNOSIS — K219 Gastro-esophageal reflux disease without esophagitis: Secondary | ICD-10-CM | POA: Diagnosis not present

## 2020-02-14 DIAGNOSIS — F419 Anxiety disorder, unspecified: Secondary | ICD-10-CM | POA: Diagnosis not present

## 2020-02-14 DIAGNOSIS — J4520 Mild intermittent asthma, uncomplicated: Secondary | ICD-10-CM | POA: Diagnosis not present

## 2020-02-14 DIAGNOSIS — M545 Low back pain, unspecified: Secondary | ICD-10-CM | POA: Diagnosis not present

## 2020-02-14 DIAGNOSIS — M25552 Pain in left hip: Secondary | ICD-10-CM | POA: Diagnosis not present

## 2020-02-14 DIAGNOSIS — Z79899 Other long term (current) drug therapy: Secondary | ICD-10-CM | POA: Diagnosis not present

## 2020-02-14 DIAGNOSIS — Z6841 Body Mass Index (BMI) 40.0 and over, adult: Secondary | ICD-10-CM | POA: Diagnosis not present

## 2020-02-14 DIAGNOSIS — M25551 Pain in right hip: Secondary | ICD-10-CM | POA: Diagnosis not present

## 2020-02-14 DIAGNOSIS — G8929 Other chronic pain: Secondary | ICD-10-CM | POA: Diagnosis not present

## 2020-02-18 DIAGNOSIS — M545 Low back pain, unspecified: Secondary | ICD-10-CM | POA: Diagnosis not present

## 2020-02-18 DIAGNOSIS — M25552 Pain in left hip: Secondary | ICD-10-CM | POA: Diagnosis not present

## 2020-02-18 DIAGNOSIS — M25551 Pain in right hip: Secondary | ICD-10-CM | POA: Diagnosis not present

## 2020-02-20 DIAGNOSIS — M25552 Pain in left hip: Secondary | ICD-10-CM | POA: Diagnosis not present

## 2020-02-20 DIAGNOSIS — M25551 Pain in right hip: Secondary | ICD-10-CM | POA: Diagnosis not present

## 2020-02-20 DIAGNOSIS — M545 Low back pain, unspecified: Secondary | ICD-10-CM | POA: Diagnosis not present

## 2020-02-21 ENCOUNTER — Other Ambulatory Visit: Payer: Self-pay | Admitting: Internal Medicine

## 2020-02-21 DIAGNOSIS — Z1231 Encounter for screening mammogram for malignant neoplasm of breast: Secondary | ICD-10-CM

## 2020-02-21 DIAGNOSIS — I890 Lymphedema, not elsewhere classified: Secondary | ICD-10-CM | POA: Diagnosis not present

## 2020-02-21 DIAGNOSIS — Z6841 Body Mass Index (BMI) 40.0 and over, adult: Secondary | ICD-10-CM | POA: Diagnosis not present

## 2020-02-21 DIAGNOSIS — M810 Age-related osteoporosis without current pathological fracture: Secondary | ICD-10-CM | POA: Diagnosis not present

## 2020-02-21 DIAGNOSIS — J4520 Mild intermittent asthma, uncomplicated: Secondary | ICD-10-CM | POA: Diagnosis not present

## 2020-02-21 DIAGNOSIS — R262 Difficulty in walking, not elsewhere classified: Secondary | ICD-10-CM | POA: Diagnosis not present

## 2020-02-21 DIAGNOSIS — F3340 Major depressive disorder, recurrent, in remission, unspecified: Secondary | ICD-10-CM | POA: Diagnosis not present

## 2020-02-21 DIAGNOSIS — R7309 Other abnormal glucose: Secondary | ICD-10-CM | POA: Diagnosis not present

## 2020-02-21 DIAGNOSIS — N3010 Interstitial cystitis (chronic) without hematuria: Secondary | ICD-10-CM | POA: Diagnosis not present

## 2020-02-21 DIAGNOSIS — Z9889 Other specified postprocedural states: Secondary | ICD-10-CM | POA: Diagnosis not present

## 2020-02-21 DIAGNOSIS — D649 Anemia, unspecified: Secondary | ICD-10-CM | POA: Diagnosis not present

## 2020-02-25 DIAGNOSIS — M25551 Pain in right hip: Secondary | ICD-10-CM | POA: Diagnosis not present

## 2020-02-25 DIAGNOSIS — M545 Low back pain, unspecified: Secondary | ICD-10-CM | POA: Diagnosis not present

## 2020-02-25 DIAGNOSIS — M25552 Pain in left hip: Secondary | ICD-10-CM | POA: Diagnosis not present

## 2020-02-26 DIAGNOSIS — H35389 Toxic maculopathy, unspecified eye: Secondary | ICD-10-CM | POA: Diagnosis not present

## 2020-02-26 DIAGNOSIS — H35372 Puckering of macula, left eye: Secondary | ICD-10-CM | POA: Diagnosis not present

## 2020-02-26 DIAGNOSIS — T372X5A Adverse effect of antimalarials and drugs acting on other blood protozoa, initial encounter: Secondary | ICD-10-CM | POA: Diagnosis not present

## 2020-02-26 DIAGNOSIS — H1045 Other chronic allergic conjunctivitis: Secondary | ICD-10-CM | POA: Diagnosis not present

## 2020-02-27 DIAGNOSIS — M25552 Pain in left hip: Secondary | ICD-10-CM | POA: Diagnosis not present

## 2020-02-27 DIAGNOSIS — M25551 Pain in right hip: Secondary | ICD-10-CM | POA: Diagnosis not present

## 2020-02-27 DIAGNOSIS — M545 Low back pain, unspecified: Secondary | ICD-10-CM | POA: Diagnosis not present

## 2020-03-03 DIAGNOSIS — E559 Vitamin D deficiency, unspecified: Secondary | ICD-10-CM | POA: Diagnosis not present

## 2020-03-03 DIAGNOSIS — M810 Age-related osteoporosis without current pathological fracture: Secondary | ICD-10-CM | POA: Diagnosis not present

## 2020-03-11 DIAGNOSIS — M25552 Pain in left hip: Secondary | ICD-10-CM | POA: Diagnosis not present

## 2020-03-11 DIAGNOSIS — M25551 Pain in right hip: Secondary | ICD-10-CM | POA: Diagnosis not present

## 2020-03-11 DIAGNOSIS — M545 Low back pain, unspecified: Secondary | ICD-10-CM | POA: Diagnosis not present

## 2020-03-13 DIAGNOSIS — M545 Low back pain, unspecified: Secondary | ICD-10-CM | POA: Diagnosis not present

## 2020-03-13 DIAGNOSIS — M25552 Pain in left hip: Secondary | ICD-10-CM | POA: Diagnosis not present

## 2020-03-13 DIAGNOSIS — M25551 Pain in right hip: Secondary | ICD-10-CM | POA: Diagnosis not present

## 2020-03-16 DIAGNOSIS — M47816 Spondylosis without myelopathy or radiculopathy, lumbar region: Secondary | ICD-10-CM | POA: Diagnosis not present

## 2020-03-16 DIAGNOSIS — Z9889 Other specified postprocedural states: Secondary | ICD-10-CM | POA: Diagnosis not present

## 2020-03-18 DIAGNOSIS — M545 Low back pain, unspecified: Secondary | ICD-10-CM | POA: Diagnosis not present

## 2020-03-18 DIAGNOSIS — M25551 Pain in right hip: Secondary | ICD-10-CM | POA: Diagnosis not present

## 2020-03-18 DIAGNOSIS — M25552 Pain in left hip: Secondary | ICD-10-CM | POA: Diagnosis not present

## 2020-03-20 DIAGNOSIS — M545 Low back pain, unspecified: Secondary | ICD-10-CM | POA: Diagnosis not present

## 2020-03-20 DIAGNOSIS — M25552 Pain in left hip: Secondary | ICD-10-CM | POA: Diagnosis not present

## 2020-03-20 DIAGNOSIS — M25551 Pain in right hip: Secondary | ICD-10-CM | POA: Diagnosis not present

## 2020-03-26 DIAGNOSIS — M5441 Lumbago with sciatica, right side: Secondary | ICD-10-CM | POA: Diagnosis not present

## 2020-03-26 DIAGNOSIS — M5442 Lumbago with sciatica, left side: Secondary | ICD-10-CM | POA: Diagnosis not present

## 2020-03-26 DIAGNOSIS — M48061 Spinal stenosis, lumbar region without neurogenic claudication: Secondary | ICD-10-CM | POA: Diagnosis not present

## 2020-04-01 ENCOUNTER — Other Ambulatory Visit: Payer: Self-pay

## 2020-04-01 ENCOUNTER — Ambulatory Visit
Admission: RE | Admit: 2020-04-01 | Discharge: 2020-04-01 | Disposition: A | Discharge status: Home / Self Care | Payer: PPO | Source: Ambulatory Visit | Attending: Internal Medicine | Admitting: Internal Medicine

## 2020-04-01 DIAGNOSIS — Z1231 Encounter for screening mammogram for malignant neoplasm of breast: Secondary | ICD-10-CM | POA: Diagnosis not present

## 2020-04-08 ENCOUNTER — Other Ambulatory Visit: Payer: Self-pay | Admitting: Internal Medicine

## 2020-04-08 DIAGNOSIS — R928 Other abnormal and inconclusive findings on diagnostic imaging of breast: Secondary | ICD-10-CM

## 2020-04-09 DIAGNOSIS — G8929 Other chronic pain: Secondary | ICD-10-CM | POA: Diagnosis not present

## 2020-04-09 DIAGNOSIS — M48061 Spinal stenosis, lumbar region without neurogenic claudication: Secondary | ICD-10-CM | POA: Diagnosis not present

## 2020-04-09 DIAGNOSIS — M5442 Lumbago with sciatica, left side: Secondary | ICD-10-CM | POA: Diagnosis not present

## 2020-04-09 DIAGNOSIS — M5441 Lumbago with sciatica, right side: Secondary | ICD-10-CM | POA: Diagnosis not present

## 2020-04-10 DIAGNOSIS — M545 Low back pain, unspecified: Secondary | ICD-10-CM | POA: Diagnosis not present

## 2020-04-10 DIAGNOSIS — M25552 Pain in left hip: Secondary | ICD-10-CM | POA: Diagnosis not present

## 2020-04-10 DIAGNOSIS — M25551 Pain in right hip: Secondary | ICD-10-CM | POA: Diagnosis not present

## 2020-04-13 ENCOUNTER — Ambulatory Visit
Admission: RE | Admit: 2020-04-13 | Discharge: 2020-04-13 | Disposition: A | Discharge status: Home / Self Care | Payer: PPO | Source: Ambulatory Visit | Attending: Internal Medicine | Admitting: Internal Medicine

## 2020-04-13 ENCOUNTER — Other Ambulatory Visit: Payer: Self-pay

## 2020-04-13 DIAGNOSIS — R928 Other abnormal and inconclusive findings on diagnostic imaging of breast: Secondary | ICD-10-CM | POA: Diagnosis not present

## 2020-04-13 DIAGNOSIS — N6489 Other specified disorders of breast: Secondary | ICD-10-CM | POA: Diagnosis not present

## 2020-04-14 DIAGNOSIS — M25552 Pain in left hip: Secondary | ICD-10-CM | POA: Diagnosis not present

## 2020-04-14 DIAGNOSIS — M25551 Pain in right hip: Secondary | ICD-10-CM | POA: Diagnosis not present

## 2020-04-14 DIAGNOSIS — M545 Low back pain, unspecified: Secondary | ICD-10-CM | POA: Diagnosis not present

## 2020-04-16 ENCOUNTER — Other Ambulatory Visit: Payer: Self-pay | Admitting: Internal Medicine

## 2020-04-16 DIAGNOSIS — R928 Other abnormal and inconclusive findings on diagnostic imaging of breast: Secondary | ICD-10-CM

## 2020-04-17 DIAGNOSIS — M25552 Pain in left hip: Secondary | ICD-10-CM | POA: Diagnosis not present

## 2020-04-17 DIAGNOSIS — M545 Low back pain, unspecified: Secondary | ICD-10-CM | POA: Diagnosis not present

## 2020-04-17 DIAGNOSIS — M25551 Pain in right hip: Secondary | ICD-10-CM | POA: Diagnosis not present

## 2020-04-21 DIAGNOSIS — M25551 Pain in right hip: Secondary | ICD-10-CM | POA: Diagnosis not present

## 2020-04-21 DIAGNOSIS — M25552 Pain in left hip: Secondary | ICD-10-CM | POA: Diagnosis not present

## 2020-04-21 DIAGNOSIS — M545 Low back pain, unspecified: Secondary | ICD-10-CM | POA: Diagnosis not present

## 2020-04-23 DIAGNOSIS — M25551 Pain in right hip: Secondary | ICD-10-CM | POA: Diagnosis not present

## 2020-04-23 DIAGNOSIS — M25552 Pain in left hip: Secondary | ICD-10-CM | POA: Diagnosis not present

## 2020-04-23 DIAGNOSIS — M545 Low back pain, unspecified: Secondary | ICD-10-CM | POA: Diagnosis not present

## 2020-04-24 ENCOUNTER — Ambulatory Visit
Admission: RE | Admit: 2020-04-24 | Discharge: 2020-04-24 | Disposition: A | Discharge status: Home / Self Care | Payer: PPO | Source: Ambulatory Visit | Attending: Internal Medicine | Admitting: Internal Medicine

## 2020-04-24 ENCOUNTER — Other Ambulatory Visit: Payer: Self-pay

## 2020-04-24 DIAGNOSIS — R928 Other abnormal and inconclusive findings on diagnostic imaging of breast: Secondary | ICD-10-CM | POA: Diagnosis not present

## 2020-04-24 DIAGNOSIS — Z7689 Persons encountering health services in other specified circumstances: Secondary | ICD-10-CM | POA: Diagnosis not present

## 2020-04-24 HISTORY — PX: BREAST BIOPSY: SHX20

## 2020-04-27 LAB — SURGICAL PATHOLOGY

## 2020-04-29 DIAGNOSIS — M545 Low back pain, unspecified: Secondary | ICD-10-CM | POA: Diagnosis not present

## 2020-04-29 DIAGNOSIS — M25551 Pain in right hip: Secondary | ICD-10-CM | POA: Diagnosis not present

## 2020-04-29 DIAGNOSIS — M25552 Pain in left hip: Secondary | ICD-10-CM | POA: Diagnosis not present

## 2020-04-30 ENCOUNTER — Telehealth (INDEPENDENT_AMBULATORY_CARE_PROVIDER_SITE_OTHER): Payer: Self-pay

## 2020-04-30 NOTE — Telephone Encounter (Signed)
Let's triage this a little more.  Where is the pain exactly? Does it happen only after using her pumps? It may some irritation from using the pumps, if her legs are painful, she can try to stop using them for a few days to see if the pain subsides.

## 2020-04-30 NOTE — Telephone Encounter (Signed)
The pt called and left a VM on the nurses line saying she has been using her lymph pumps for an 1 hour a day everyday  and is now having pain in both legs and wants to  Know is this normal, she has been treating the pain with Ib profen and muscle rub cream. Please advise.

## 2020-04-30 NOTE — Telephone Encounter (Signed)
I called pt The  pain is starting an her calf and going up to her knee the pain doesn't happen every time but is she only experiences the pain after she uses the pumps  she only uses them once a day instead of 2 time due to not having help to put them on as well. Used the pumps today at 12:30 today and is having no pain so far.

## 2020-05-01 DIAGNOSIS — M25552 Pain in left hip: Secondary | ICD-10-CM | POA: Diagnosis not present

## 2020-05-01 DIAGNOSIS — M545 Low back pain, unspecified: Secondary | ICD-10-CM | POA: Diagnosis not present

## 2020-05-01 DIAGNOSIS — M25551 Pain in right hip: Secondary | ICD-10-CM | POA: Diagnosis not present

## 2020-05-08 ENCOUNTER — Other Ambulatory Visit: Payer: Self-pay | Admitting: Orthopedic Surgery

## 2020-05-08 ENCOUNTER — Ambulatory Visit
Admission: RE | Admit: 2020-05-08 | Discharge: 2020-05-08 | Disposition: A | Discharge status: Home / Self Care | Payer: PPO | Source: Ambulatory Visit | Attending: Orthopedic Surgery | Admitting: Orthopedic Surgery

## 2020-05-08 ENCOUNTER — Other Ambulatory Visit: Payer: Self-pay

## 2020-05-08 DIAGNOSIS — M1611 Unilateral primary osteoarthritis, right hip: Secondary | ICD-10-CM

## 2020-05-08 DIAGNOSIS — M7061 Trochanteric bursitis, right hip: Secondary | ICD-10-CM | POA: Diagnosis not present

## 2020-05-08 DIAGNOSIS — M25551 Pain in right hip: Secondary | ICD-10-CM | POA: Diagnosis not present

## 2020-05-15 DIAGNOSIS — M7061 Trochanteric bursitis, right hip: Secondary | ICD-10-CM | POA: Diagnosis not present

## 2020-05-22 ENCOUNTER — Ambulatory Visit: Payer: PPO

## 2020-05-25 DIAGNOSIS — G2581 Restless legs syndrome: Secondary | ICD-10-CM | POA: Diagnosis not present

## 2020-05-25 DIAGNOSIS — Z6841 Body Mass Index (BMI) 40.0 and over, adult: Secondary | ICD-10-CM | POA: Diagnosis not present

## 2020-05-25 DIAGNOSIS — G5793 Unspecified mononeuropathy of bilateral lower limbs: Secondary | ICD-10-CM | POA: Diagnosis not present

## 2020-05-25 DIAGNOSIS — I890 Lymphedema, not elsewhere classified: Secondary | ICD-10-CM | POA: Diagnosis not present

## 2020-05-25 DIAGNOSIS — F3340 Major depressive disorder, recurrent, in remission, unspecified: Secondary | ICD-10-CM | POA: Diagnosis not present

## 2020-05-25 DIAGNOSIS — R262 Difficulty in walking, not elsewhere classified: Secondary | ICD-10-CM | POA: Diagnosis not present

## 2020-05-25 DIAGNOSIS — M159 Polyosteoarthritis, unspecified: Secondary | ICD-10-CM | POA: Diagnosis not present

## 2020-05-26 DIAGNOSIS — M25551 Pain in right hip: Secondary | ICD-10-CM | POA: Diagnosis not present

## 2020-05-26 DIAGNOSIS — M25552 Pain in left hip: Secondary | ICD-10-CM | POA: Diagnosis not present

## 2020-05-26 DIAGNOSIS — M545 Low back pain, unspecified: Secondary | ICD-10-CM | POA: Diagnosis not present

## 2020-05-29 DIAGNOSIS — M545 Low back pain, unspecified: Secondary | ICD-10-CM | POA: Diagnosis not present

## 2020-05-29 DIAGNOSIS — M25552 Pain in left hip: Secondary | ICD-10-CM | POA: Diagnosis not present

## 2020-05-29 DIAGNOSIS — M25551 Pain in right hip: Secondary | ICD-10-CM | POA: Diagnosis not present

## 2020-06-05 DIAGNOSIS — M25552 Pain in left hip: Secondary | ICD-10-CM | POA: Diagnosis not present

## 2020-06-05 DIAGNOSIS — M545 Low back pain, unspecified: Secondary | ICD-10-CM | POA: Diagnosis not present

## 2020-06-05 DIAGNOSIS — M25551 Pain in right hip: Secondary | ICD-10-CM | POA: Diagnosis not present

## 2020-06-09 DIAGNOSIS — Z6841 Body Mass Index (BMI) 40.0 and over, adult: Secondary | ICD-10-CM | POA: Diagnosis not present

## 2020-06-09 DIAGNOSIS — M159 Polyosteoarthritis, unspecified: Secondary | ICD-10-CM | POA: Diagnosis not present

## 2020-06-09 DIAGNOSIS — G5793 Unspecified mononeuropathy of bilateral lower limbs: Secondary | ICD-10-CM | POA: Diagnosis not present

## 2020-06-09 DIAGNOSIS — F3340 Major depressive disorder, recurrent, in remission, unspecified: Secondary | ICD-10-CM | POA: Diagnosis not present

## 2020-06-09 DIAGNOSIS — G2581 Restless legs syndrome: Secondary | ICD-10-CM | POA: Diagnosis not present

## 2020-06-09 DIAGNOSIS — I890 Lymphedema, not elsewhere classified: Secondary | ICD-10-CM | POA: Diagnosis not present

## 2020-06-09 DIAGNOSIS — R262 Difficulty in walking, not elsewhere classified: Secondary | ICD-10-CM | POA: Diagnosis not present

## 2020-06-10 DIAGNOSIS — M545 Low back pain, unspecified: Secondary | ICD-10-CM | POA: Diagnosis not present

## 2020-06-10 DIAGNOSIS — M25552 Pain in left hip: Secondary | ICD-10-CM | POA: Diagnosis not present

## 2020-06-10 DIAGNOSIS — M25551 Pain in right hip: Secondary | ICD-10-CM | POA: Diagnosis not present

## 2020-06-11 ENCOUNTER — Encounter (INDEPENDENT_AMBULATORY_CARE_PROVIDER_SITE_OTHER): Payer: Self-pay | Admitting: Nurse Practitioner

## 2020-06-11 ENCOUNTER — Ambulatory Visit (INDEPENDENT_AMBULATORY_CARE_PROVIDER_SITE_OTHER): Payer: PPO | Admitting: Nurse Practitioner

## 2020-06-11 ENCOUNTER — Other Ambulatory Visit: Payer: Self-pay

## 2020-06-11 VITALS — BP 171/77 | HR 92 | Resp 16 | Wt 200.0 lb

## 2020-06-11 DIAGNOSIS — I890 Lymphedema, not elsewhere classified: Secondary | ICD-10-CM | POA: Diagnosis not present

## 2020-06-11 DIAGNOSIS — E785 Hyperlipidemia, unspecified: Secondary | ICD-10-CM | POA: Diagnosis not present

## 2020-06-11 DIAGNOSIS — R8290 Unspecified abnormal findings in urine: Secondary | ICD-10-CM | POA: Diagnosis not present

## 2020-06-11 DIAGNOSIS — R399 Unspecified symptoms and signs involving the genitourinary system: Secondary | ICD-10-CM | POA: Diagnosis not present

## 2020-06-12 DIAGNOSIS — M25551 Pain in right hip: Secondary | ICD-10-CM | POA: Diagnosis not present

## 2020-06-12 DIAGNOSIS — M545 Low back pain, unspecified: Secondary | ICD-10-CM | POA: Diagnosis not present

## 2020-06-12 DIAGNOSIS — M25552 Pain in left hip: Secondary | ICD-10-CM | POA: Diagnosis not present

## 2020-06-15 ENCOUNTER — Encounter (INDEPENDENT_AMBULATORY_CARE_PROVIDER_SITE_OTHER): Payer: Self-pay | Admitting: Nurse Practitioner

## 2020-06-15 NOTE — Progress Notes (Signed)
Subjective:    Patient ID: Amber Kirk, female    DOB: Sep 06, 1934, 86 y.o.   MRN: [id number] Chief Complaint  Patient presents with  . Follow-up    6 month follow up    The patient returns to the office for followup evaluation regarding leg swelling.  The swelling has persisted but with the lymph pump is  better controlled. The pain associated with swelling is essentially eliminated. There have not been any interval development of a ulcerations or wounds.  The patient denies problems with the pump, noting it is working well and the leggings are in good condition.  Since the previous visit the patient has been wearing graduated compression stockings and using the lymph pump on a routine basis and  has noted significant improvement in the lymphedema.   Patient stated the lymph pump has been a very positive factor in her care.    Review of Systems  All other systems reviewed and are negative.      Objective:   Physical Exam Vitals reviewed.  Cardiovascular:     Rate and Rhythm: Normal rate.     Pulses: Normal pulses.  Pulmonary:     Effort: Pulmonary effort is normal.  Neurological:     Mental Status: She is alert and oriented to person, place, and time.  Psychiatric:        Mood and Affect: Mood normal.        Behavior: Behavior normal.        Thought Content: Thought content normal.        Judgment: Judgment normal.     BP (!) 171/77 (BP Location: Left Arm)   Pulse 92   Resp 16   Wt 200 lb (90.7 kg)   BMI 40.40 kg/m   Past Medical History:  Diagnosis Date  . Allergy   . Asthma   . Bowel obstruction ([place]) 06/2019  . Complication of anesthesia   . COPD (chronic obstructive pulmonary disease) ([place])   . GERD (gastroesophageal reflux disease)   . Heart murmur   . Interstitial cystitis   . PONV (postoperative nausea and vomiting)    after 1st cataract surgery  . Sleep apnea    mild-no cpap     Social History   Socioeconomic History  .  Marital status: Widowed    Spouse name: Not on file  . Number of children: Not on file  . Years of education: Not on file  . Highest education level: Not on file  Occupational History  . Not on file  Tobacco Use  . Smoking status: Never Smoker  . Smokeless tobacco: Never Used  Vaping Use  . Vaping Use: Never used  Substance and Sexual Activity  . Alcohol use: No  . Drug use: No  . Sexual activity: Not on file  Other Topics Concern  . Not on file  Social History Narrative  . Not on file   Social Determinants of Health   Financial Resource Strain: Not on file  Food Insecurity: Not on file  Transportation Needs: Not on file  Physical Activity: Not on file  Stress: Not on file  Social Connections: Not on file  Intimate Partner Violence: Not on file    Past Surgical History:  Procedure Laterality Date  . ABDOMINAL HYSTERECTOMY    . APPENDECTOMY    . BLADDER SURGERY    . BREAST BIOPSY Right 04/24/2020   stereo bx, x clip, path pending   . CHOLECYSTECTOMY    .  EYE SURGERY     bil cataracts  . KYPHOPLASTY N/A 07/05/2019   Procedure: T12 KYPHOPLASTY;  Surgeon: Hessie Knows, MD;  Location: [facility] ORS;  Service: Orthopedics;  Laterality: N/A;  . KYPHOPLASTY N/A 07/23/2019   Procedure: L1 KYPHOPLASTY;  Surgeon: Hessie Knows, MD;  Location: [facility] ORS;  Service: Orthopedics;  Laterality: N/A;  . TONSILLECTOMY      Family History  Problem Relation Age of Onset  . Heart disease Mother   . Stroke Father   . Breast cancer Neg Hx     Allergies  Allergen Reactions  . Penicillins Hives, Rash and Other (See Comments)    And itching  . Morphine And Related Nausea And Vomiting and Nausea Only  . Chlorzoxazone Nausea And Vomiting  . Ciprofloxacin Nausea And Vomiting  . Clarithromycin Nausea And Vomiting  . Clindamycin Hcl Nausea And Vomiting  . Codeine Sulfate Nausea And Vomiting  . Conjugated Estrogens Nausea And Vomiting  . Hydrocodone-Homatropine Nausea And Vomiting  .  Hydromorphone Nausea And Vomiting  . Lidocaine Nausea And Vomiting  . Meperidine Nausea And Vomiting  . Propoxyphene Nausea And Vomiting  . Sulfa Antibiotics Nausea And Vomiting  . Tioconazole Nausea Only    CBC Latest Ref Rng & Units 09/12/2019 07/15/2019 06/28/2019  WBC 4.0 - 10.5 K/uL 8.2 9.8 13.6(H)  Hemoglobin 12.0 - 15.0 g/dL 12.8 12.8 12.8  Hematocrit 36.0 - 46.0 % 38.3 39.4 39.9  Platelets 150 - 400 K/uL 201 323 302      CMP     Component Value Date/Time   NA 133 (L) 07/15/2019 1532   NA 139 06/22/2013 2023   K 4.6 07/15/2019 1532   K 3.9 06/22/2013 2023   CL 95 (L) 07/15/2019 1532   CL 103 06/22/2013 2023   CO2 28 07/15/2019 1532   CO2 29 06/22/2013 2023   GLUCOSE 115 (H) 07/15/2019 1532   GLUCOSE 124 (H) 06/22/2013 2023   BUN 16 07/15/2019 1532   BUN 16 06/22/2013 2023   CREATININE 0.58 07/15/2019 1532   CREATININE 0.91 06/22/2013 2023   CALCIUM 9.6 07/15/2019 1532   CALCIUM 9.3 06/22/2013 2023   PROT 7.0 06/27/2019 1402   ALBUMIN 3.7 06/27/2019 1402   AST 26 06/27/2019 1402   ALT 21 06/27/2019 1402   ALKPHOS 81 06/27/2019 1402   BILITOT 0.9 06/27/2019 1402   GFRNONAA >60 07/15/2019 1532   GFRNONAA >60 06/22/2013 2023   GFRAA >60 07/15/2019 1532   GFRAA >60 06/22/2013 2023     No results found.     Assessment & Plan:   1. Lymphedema  No surgery or intervention at this point in time.    I have reviewed my discussion with the patient regarding lymphedema and why it  causes symptoms.  Patient will continue wearing graduated compression stockings class 1 (20-30 mmHg) on a daily basis a prescription was given. The patient is reminded to put the stockings on first thing in the morning and removing them in the evening. The patient is instructed specifically not to sleep in the stockings.   In addition, behavioral modification throughout the day will be continued.  This will include frequent elevation (such as in a recliner), use of over the counter pain  medications as needed and exercise such as walking.  I have reviewed systemic causes for chronic edema such as liver, kidney and cardiac etiologies and there does not appear to be any significant changes in these organ systems over the past year.  The patient  is under the impression that these organ systems are all stable and unchanged.    The patient will continue  use of the  lymph pump.  This will continue to improve the edema control and prevent sequela such as ulcers and infections.   The patient will follow-up with me on an annual basis.    2. Hyperlipidemia, unspecified hyperlipidemia type Continue statin as ordered and reviewed, no changes at this time    Current Outpatient Medications on File Prior to Visit  Medication Sig Dispense Refill  . acetaminophen (TYLENOL) 500 MG tablet Take 1,000 mg by mouth every 6 (six) hours as needed.    Marland Kitchen albuterol (VENTOLIN HFA) 108 (90 Base) MCG/ACT inhaler Inhale 1-2 puffs into the lungs every 6 (six) hours as needed for wheezing or shortness of breath.    Marland Kitchen alendronate (FOSAMAX) 70 MG tablet Take 70 mg by mouth once a week. Take with a full glass of water on an empty stomach.    . Aloe Vera Freeze Dried POWD Take 3 capsules by mouth in the morning and at bedtime.    Marland Kitchen aspirin EC 81 MG tablet Take 81 mg by mouth daily.     Marland Kitchen azelastine (ASTELIN) 0.1 % nasal spray Place 1 spray into both nostrils 2 (two) times daily. Use in each nostril as directed    . Calcium Carbonate (CALCIUM 500 PO) Take 500 mg by mouth daily.    . cetirizine (ZYRTEC) 10 MG tablet Take 10 mg by mouth every morning.     . cholecalciferol (VITAMIN D) 25 MCG (1000 UNIT) tablet Take 1,000 Units by mouth daily.    . Cyanocobalamin (B-12) 1000 MCG CAPS Take 1,500 mcg by mouth daily.    . diclofenac Sodium (VOLTAREN) 1 % GEL Apply topically 4 (four) times daily.    Marland Kitchen docusate sodium (COLACE) 100 MG capsule Take 1 capsule (100 mg total) by mouth 2 (two) times daily. 10 capsule 0  .  doxepin (SINEQUAN) 25 MG capsule Take 25 mg by mouth at bedtime.     Marland Kitchen estradiol (ESTRACE) 0.1 MG/GM vaginal cream Place 1 Applicatorful vaginally 3 (three) times a week.    . fluticasone (FLONASE) 50 MCG/ACT nasal spray Place 1 spray into both nostrils in the morning and at bedtime.     . gabapentin (NEURONTIN) 100 MG capsule Take by mouth.    . hydroxychloroquine (PLAQUENIL) 200 MG tablet Take 100 mg by mouth daily.     . iron polysaccharides (NIFEREX) 150 MG capsule Take by mouth.    . lidocaine (LIDODERM) 5 % Place 1 patch onto the skin daily. Remove & Discard patch within 12 hours or as directed by MD    . Rolan Lipa 145 MCG CAPS capsule Take 145 mcg by mouth daily as needed (constipation.).     Marland Kitchen lovastatin (MEVACOR) 20 MG tablet Take 20 mg by mouth daily with supper.     . meloxicam (MOBIC) 15 MG tablet Take 1 tablet by mouth daily.    . montelukast (SINGULAIR) 10 MG tablet Take 10 mg by mouth every evening.     . Multiple Vitamins-Minerals (ADULT GUMMY PO) Take 2 tablets by mouth daily. Vitafusion Women's    . olopatadine (PATANOL) 0.1 % ophthalmic solution Place 1 drop into both eyes 2 (two) times daily as needed for allergies.     Marland Kitchen omeprazole (PRILOSEC) 20 MG capsule Take 20 mg by mouth in the morning and at bedtime.     . ondansetron (ZOFRAN-ODT) 4  MG disintegrating tablet Take 4 mg by mouth every 8 (eight) hours as needed for nausea/vomiting.    . polyethylene glycol (MIRALAX / GLYCOLAX) 17 g packet Take 17 g by mouth 2 (two) times daily. 14 each 0  . pramipexole (MIRAPEX) 0.5 MG tablet Take 0.5 mg by mouth 3 (three) times daily.    . Red Yeast Rice 600 MG CAPS Take by mouth daily.    . SYMBICORT 160-4.5 MCG/ACT inhaler Inhale 2 puffs into the lungs in the morning and at bedtime.     . trospium (SANCTURA) 20 MG tablet Take 20 mg by mouth at bedtime.    . predniSONE (DELTASONE) 10 MG tablet Take by mouth. (Patient not taking: No sig reported)    . torsemide (DEMADEX) 10 MG tablet Take  10 mg by mouth daily. (Patient not taking: Reported on 06/11/2020)     No current facility-administered medications on file prior to visit.    There are no Patient Instructions on file for this visit. No follow-ups on file.   Kris Hartmann, NP

## 2020-06-16 DIAGNOSIS — M25552 Pain in left hip: Secondary | ICD-10-CM | POA: Diagnosis not present

## 2020-06-16 DIAGNOSIS — M545 Low back pain, unspecified: Secondary | ICD-10-CM | POA: Diagnosis not present

## 2020-06-16 DIAGNOSIS — M25551 Pain in right hip: Secondary | ICD-10-CM | POA: Diagnosis not present

## 2020-06-18 DIAGNOSIS — Z9889 Other specified postprocedural states: Secondary | ICD-10-CM | POA: Diagnosis not present

## 2020-06-18 DIAGNOSIS — R609 Edema, unspecified: Secondary | ICD-10-CM | POA: Diagnosis not present

## 2020-06-18 DIAGNOSIS — M25551 Pain in right hip: Secondary | ICD-10-CM | POA: Diagnosis not present

## 2020-06-18 DIAGNOSIS — M25552 Pain in left hip: Secondary | ICD-10-CM | POA: Diagnosis not present

## 2020-06-18 DIAGNOSIS — Z6841 Body Mass Index (BMI) 40.0 and over, adult: Secondary | ICD-10-CM | POA: Diagnosis not present

## 2020-06-18 DIAGNOSIS — M159 Polyosteoarthritis, unspecified: Secondary | ICD-10-CM | POA: Diagnosis not present

## 2020-06-18 DIAGNOSIS — F3340 Major depressive disorder, recurrent, in remission, unspecified: Secondary | ICD-10-CM | POA: Diagnosis not present

## 2020-06-18 DIAGNOSIS — M810 Age-related osteoporosis without current pathological fracture: Secondary | ICD-10-CM | POA: Diagnosis not present

## 2020-06-18 DIAGNOSIS — R262 Difficulty in walking, not elsewhere classified: Secondary | ICD-10-CM | POA: Diagnosis not present

## 2020-06-18 DIAGNOSIS — R7309 Other abnormal glucose: Secondary | ICD-10-CM | POA: Diagnosis not present

## 2020-06-18 DIAGNOSIS — M0579 Rheumatoid arthritis with rheumatoid factor of multiple sites without organ or systems involvement: Secondary | ICD-10-CM | POA: Diagnosis not present

## 2020-06-18 DIAGNOSIS — M545 Low back pain, unspecified: Secondary | ICD-10-CM | POA: Diagnosis not present

## 2020-06-18 DIAGNOSIS — J4520 Mild intermittent asthma, uncomplicated: Secondary | ICD-10-CM | POA: Diagnosis not present

## 2020-06-18 DIAGNOSIS — R8290 Unspecified abnormal findings in urine: Secondary | ICD-10-CM | POA: Diagnosis not present

## 2020-06-18 DIAGNOSIS — I890 Lymphedema, not elsewhere classified: Secondary | ICD-10-CM | POA: Diagnosis not present

## 2020-06-18 DIAGNOSIS — M8000XD Age-related osteoporosis with current pathological fracture, unspecified site, subsequent encounter for fracture with routine healing: Secondary | ICD-10-CM | POA: Diagnosis not present

## 2020-06-18 DIAGNOSIS — D649 Anemia, unspecified: Secondary | ICD-10-CM | POA: Diagnosis not present

## 2020-06-18 DIAGNOSIS — N3010 Interstitial cystitis (chronic) without hematuria: Secondary | ICD-10-CM | POA: Diagnosis not present

## 2020-06-25 DIAGNOSIS — M8000XD Age-related osteoporosis with current pathological fracture, unspecified site, subsequent encounter for fracture with routine healing: Secondary | ICD-10-CM | POA: Diagnosis not present

## 2020-06-25 DIAGNOSIS — M159 Polyosteoarthritis, unspecified: Secondary | ICD-10-CM | POA: Diagnosis not present

## 2020-06-25 DIAGNOSIS — M0579 Rheumatoid arthritis with rheumatoid factor of multiple sites without organ or systems involvement: Secondary | ICD-10-CM | POA: Diagnosis not present

## 2020-07-01 DIAGNOSIS — M25552 Pain in left hip: Secondary | ICD-10-CM | POA: Diagnosis not present

## 2020-07-01 DIAGNOSIS — M25551 Pain in right hip: Secondary | ICD-10-CM | POA: Diagnosis not present

## 2020-07-01 DIAGNOSIS — M545 Low back pain, unspecified: Secondary | ICD-10-CM | POA: Diagnosis not present

## 2020-07-03 DIAGNOSIS — M25551 Pain in right hip: Secondary | ICD-10-CM | POA: Diagnosis not present

## 2020-07-03 DIAGNOSIS — M25552 Pain in left hip: Secondary | ICD-10-CM | POA: Diagnosis not present

## 2020-07-03 DIAGNOSIS — M545 Low back pain, unspecified: Secondary | ICD-10-CM | POA: Diagnosis not present

## 2020-07-06 DIAGNOSIS — M25552 Pain in left hip: Secondary | ICD-10-CM | POA: Diagnosis not present

## 2020-07-06 DIAGNOSIS — M25551 Pain in right hip: Secondary | ICD-10-CM | POA: Diagnosis not present

## 2020-07-06 DIAGNOSIS — M545 Low back pain, unspecified: Secondary | ICD-10-CM | POA: Diagnosis not present

## 2020-07-08 DIAGNOSIS — R3915 Urgency of urination: Secondary | ICD-10-CM | POA: Diagnosis not present

## 2020-07-08 DIAGNOSIS — N390 Urinary tract infection, site not specified: Secondary | ICD-10-CM | POA: Diagnosis not present

## 2020-07-15 DIAGNOSIS — M545 Low back pain, unspecified: Secondary | ICD-10-CM | POA: Diagnosis not present

## 2020-07-15 DIAGNOSIS — M25552 Pain in left hip: Secondary | ICD-10-CM | POA: Diagnosis not present

## 2020-07-15 DIAGNOSIS — M25551 Pain in right hip: Secondary | ICD-10-CM | POA: Diagnosis not present

## 2020-07-17 DIAGNOSIS — M545 Low back pain, unspecified: Secondary | ICD-10-CM | POA: Diagnosis not present

## 2020-07-17 DIAGNOSIS — M25552 Pain in left hip: Secondary | ICD-10-CM | POA: Diagnosis not present

## 2020-07-17 DIAGNOSIS — M25551 Pain in right hip: Secondary | ICD-10-CM | POA: Diagnosis not present

## 2020-07-21 ENCOUNTER — Other Ambulatory Visit: Payer: Self-pay | Admitting: Internal Medicine

## 2020-07-21 ENCOUNTER — Other Ambulatory Visit (HOSPITAL_COMMUNITY): Payer: Self-pay | Admitting: Internal Medicine

## 2020-07-21 DIAGNOSIS — Z0000 Encounter for general adult medical examination without abnormal findings: Secondary | ICD-10-CM | POA: Diagnosis not present

## 2020-07-21 DIAGNOSIS — J449 Chronic obstructive pulmonary disease, unspecified: Secondary | ICD-10-CM | POA: Diagnosis not present

## 2020-07-21 DIAGNOSIS — G5793 Unspecified mononeuropathy of bilateral lower limbs: Secondary | ICD-10-CM | POA: Diagnosis not present

## 2020-07-21 DIAGNOSIS — R600 Localized edema: Secondary | ICD-10-CM | POA: Diagnosis not present

## 2020-07-21 DIAGNOSIS — Z6841 Body Mass Index (BMI) 40.0 and over, adult: Secondary | ICD-10-CM | POA: Diagnosis not present

## 2020-07-21 DIAGNOSIS — R262 Difficulty in walking, not elsewhere classified: Secondary | ICD-10-CM | POA: Diagnosis not present

## 2020-07-21 DIAGNOSIS — R29898 Other symptoms and signs involving the musculoskeletal system: Secondary | ICD-10-CM

## 2020-07-21 DIAGNOSIS — F330 Major depressive disorder, recurrent, mild: Secondary | ICD-10-CM | POA: Diagnosis not present

## 2020-07-23 ENCOUNTER — Ambulatory Visit
Admission: RE | Admit: 2020-07-23 | Discharge: 2020-07-23 | Disposition: A | Discharge status: Home / Self Care | Payer: PPO | Source: Ambulatory Visit | Attending: Internal Medicine | Admitting: Internal Medicine

## 2020-07-23 ENCOUNTER — Other Ambulatory Visit: Payer: Self-pay

## 2020-07-23 DIAGNOSIS — R29898 Other symptoms and signs involving the musculoskeletal system: Secondary | ICD-10-CM | POA: Diagnosis not present

## 2020-07-23 DIAGNOSIS — G5793 Unspecified mononeuropathy of bilateral lower limbs: Secondary | ICD-10-CM | POA: Insufficient documentation

## 2020-07-23 DIAGNOSIS — R262 Difficulty in walking, not elsewhere classified: Secondary | ICD-10-CM | POA: Insufficient documentation

## 2020-07-23 DIAGNOSIS — R600 Localized edema: Secondary | ICD-10-CM | POA: Insufficient documentation

## 2020-07-27 DIAGNOSIS — M25552 Pain in left hip: Secondary | ICD-10-CM | POA: Diagnosis not present

## 2020-07-27 DIAGNOSIS — M25551 Pain in right hip: Secondary | ICD-10-CM | POA: Diagnosis not present

## 2020-07-27 DIAGNOSIS — M545 Low back pain, unspecified: Secondary | ICD-10-CM | POA: Diagnosis not present

## 2020-07-28 DIAGNOSIS — H903 Sensorineural hearing loss, bilateral: Secondary | ICD-10-CM | POA: Diagnosis not present

## 2020-07-29 DIAGNOSIS — M25551 Pain in right hip: Secondary | ICD-10-CM | POA: Diagnosis not present

## 2020-07-29 DIAGNOSIS — M25552 Pain in left hip: Secondary | ICD-10-CM | POA: Diagnosis not present

## 2020-07-29 DIAGNOSIS — M545 Low back pain, unspecified: Secondary | ICD-10-CM | POA: Diagnosis not present

## 2020-07-31 DIAGNOSIS — H903 Sensorineural hearing loss, bilateral: Secondary | ICD-10-CM | POA: Diagnosis not present

## 2020-08-04 DIAGNOSIS — K219 Gastro-esophageal reflux disease without esophagitis: Secondary | ICD-10-CM | POA: Diagnosis not present

## 2020-08-04 DIAGNOSIS — J449 Chronic obstructive pulmonary disease, unspecified: Secondary | ICD-10-CM | POA: Diagnosis not present

## 2020-08-04 DIAGNOSIS — R0600 Dyspnea, unspecified: Secondary | ICD-10-CM | POA: Diagnosis not present

## 2020-08-04 DIAGNOSIS — J310 Chronic rhinitis: Secondary | ICD-10-CM | POA: Diagnosis not present

## 2020-08-04 DIAGNOSIS — J4530 Mild persistent asthma, uncomplicated: Secondary | ICD-10-CM | POA: Diagnosis not present

## 2020-08-05 ENCOUNTER — Other Ambulatory Visit: Payer: Self-pay

## 2020-08-05 ENCOUNTER — Ambulatory Visit: Payer: PPO | Admitting: Podiatry

## 2020-08-05 ENCOUNTER — Encounter: Payer: Self-pay | Admitting: Podiatry

## 2020-08-05 DIAGNOSIS — R29898 Other symptoms and signs involving the musculoskeletal system: Secondary | ICD-10-CM

## 2020-08-05 DIAGNOSIS — G5793 Unspecified mononeuropathy of bilateral lower limbs: Secondary | ICD-10-CM | POA: Diagnosis not present

## 2020-08-05 DIAGNOSIS — M25552 Pain in left hip: Secondary | ICD-10-CM | POA: Diagnosis not present

## 2020-08-05 DIAGNOSIS — M545 Low back pain, unspecified: Secondary | ICD-10-CM | POA: Diagnosis not present

## 2020-08-05 DIAGNOSIS — M25551 Pain in right hip: Secondary | ICD-10-CM | POA: Diagnosis not present

## 2020-08-05 NOTE — Addendum Note (Signed)
Addended by: Clovis Riley E on: 08/05/2020 04:41 PM   Modules accepted: Orders

## 2020-08-05 NOTE — Progress Notes (Signed)
Subjective:  Patient ID: Amber Kirk, female    DOB: October 08, 1934,  MRN: [id number] HPI Chief Complaint  Patient presents with  . Foot Pain    Left foot - turns out when walking, neuropathy bilateral-takes gabapentin TID,   . New Patient (Initial Visit)    86 y.o. female presents with the above complaint.   ROS: Denies fever chills nausea vomiting muscle aches pains calf pain back pain chest pain shortness of breath.  Does relate a history of back problems and relates that her left foot turns out as she walks now and she feels that this may be part of her loss of balance.  She states that the foot itself really does not hurt but she does have some numbness and tingling and pain.  Past Medical History:  Diagnosis Date  . Allergy   . Asthma   . Bowel obstruction ([place]) 06/2019  . Complication of anesthesia   . COPD (chronic obstructive pulmonary disease) ([place])   . GERD (gastroesophageal reflux disease)   . Heart murmur   . Interstitial cystitis   . PONV (postoperative nausea and vomiting)    after 1st cataract surgery  . Sleep apnea    mild-no cpap    Past Surgical History:  Procedure Laterality Date  . ABDOMINAL HYSTERECTOMY    . APPENDECTOMY    . BLADDER SURGERY    . BREAST BIOPSY Right 04/24/2020   stereo bx, x clip, path pending   . CHOLECYSTECTOMY    . EYE SURGERY     bil cataracts  . KYPHOPLASTY N/A 07/05/2019   Procedure: T12 KYPHOPLASTY;  Surgeon: Hessie Knows, MD;  Location: [facility] ORS;  Service: Orthopedics;  Laterality: N/A;  . KYPHOPLASTY N/A 07/23/2019   Procedure: L1 KYPHOPLASTY;  Surgeon: Hessie Knows, MD;  Location: [facility] OR[facility];  Service: Orthopedics;  Laterality: N/A;  . TONSILLECTOMY      Current Outpatient Medications:  .  albuterol (VENTOLIN HFA) 108 (90 Base) MCG/ACT inhaler, Inhale 1-2 puffs into the lungs every 6 (six) hours as needed for wheezing or shortness of breath., Disp: , Rfl:  .  aspirin EC 81 MG tablet, Take 81 mg by mouth daily.  , Disp: , Rfl:  .  azelastine (ASTELIN) 0.1 % nasal spray, Place 1 spray into both nostrils 2 (two) times daily. Use in each nostril as directed, Disp: , Rfl:  .  Calcium Carbonate (CALCIUM 500 PO), Take 500 mg by mouth daily., Disp: , Rfl:  .  cetirizine (ZYRTEC) 10 MG tablet, Take 10 mg by mouth every morning. , Disp: , Rfl:  .  cholecalciferol (VITAMIN D) 25 MCG (1000 UNIT) tablet, Take 1,000 Units by mouth daily., Disp: , Rfl:  .  docusate sodium (COLACE) 100 MG capsule, Take 1 capsule (100 mg total) by mouth 2 (two) times daily., Disp: 10 capsule, Rfl: 0 .  doxepin (SINEQUAN) 10 MG capsule, Take 10 mg by mouth at bedtime., Disp: , Rfl:  .  estradiol (ESTRACE) 0.1 MG/GM vaginal cream, Place 1 Applicatorful vaginally 3 (three) times a week., Disp: , Rfl:  .  fluticasone (FLONASE) 50 MCG/ACT nasal spray, Place 1 spray into both nostrils in the morning and at bedtime. , Disp: , Rfl:  .  gabapentin (NEURONTIN) 100 MG capsule, Take by mouth., Disp: , Rfl:  .  hydroxychloroquine (PLAQUENIL) 200 MG tablet, Take 100 mg by mouth daily. , Disp: , Rfl:  .  lidocaine (LIDODERM) 5 %, Place 1 patch onto the skin daily. Remove & Discard  patch within 12 hours or as directed by MD, Disp: , Rfl:  .  LINZESS 145 MCG CAPS capsule, Take 145 mcg by mouth daily as needed (constipation.). , Disp: , Rfl:  .  lovastatin (MEVACOR) 20 MG tablet, Take 20 mg by mouth daily with supper. , Disp: , Rfl:  .  montelukast (SINGULAIR) 10 MG tablet, Take 10 mg by mouth every evening. , Disp: , Rfl:  .  olopatadine (PATANOL) 0.1 % ophthalmic solution, Place 1 drop into both eyes 2 (two) times daily as needed for allergies. , Disp: , Rfl:  .  omeprazole (PRILOSEC) 20 MG capsule, Take 20 mg by mouth in the morning and at bedtime. , Disp: , Rfl:  .  ondansetron (ZOFRAN-ODT) 4 MG disintegrating tablet, Take 4 mg by mouth every 8 (eight) hours as needed for nausea/vomiting., Disp: , Rfl:  .  polyethylene glycol (MIRALAX / GLYCOLAX) 17 g  packet, Take 17 g by mouth 2 (two) times daily., Disp: 14 each, Rfl: 0 .  pramipexole (MIRAPEX) 0.5 MG tablet, Take 0.5 mg by mouth 3 (three) times daily., Disp: , Rfl:  .  SYMBICORT 160-4.5 MCG/ACT inhaler, Inhale 2 puffs into the lungs in the morning and at bedtime. , Disp: , Rfl:  .  trospium (SANCTURA) 20 MG tablet, Take 20 mg by mouth at bedtime., Disp: , Rfl:   Allergies  Allergen Reactions  . Penicillins Hives, Rash and Other (See Comments)    And itching  . Morphine And Related Nausea And Vomiting and Nausea Only  . Chlorzoxazone Nausea And Vomiting  . Ciprofloxacin Nausea And Vomiting  . Clarithromycin Nausea And Vomiting  . Clindamycin Hcl Nausea And Vomiting  . Codeine Sulfate Nausea And Vomiting  . Conjugated Estrogens Nausea And Vomiting  . Hydrocodone Bit-Homatrop Mbr Nausea And Vomiting  . Hydromorphone Nausea And Vomiting  . Lidocaine Nausea And Vomiting  . Meperidine Nausea And Vomiting  . Propoxyphene Nausea And Vomiting  . Sulfa Antibiotics Nausea And Vomiting  . Tioconazole Nausea Only   Review of Systems Objective:  There were no vitals filed for this visit.  General: Well developed, nourished, in no acute distress, alert and oriented x3   Dermatological: Skin is warm, dry and supple bilateral. Nails x 10 are well maintained; remaining integument appears unremarkable at this time. There are no open sores, no preulcerative lesions, no rash or signs of infection present.  Vascular: Dorsalis Pedis artery and Posterior Tibial artery pedal pulses are 2/4 bilateral with immedate capillary fill time. Pedal hair growth present. No varicosities and no lower extremity edema present bilateral.   Neruologic: Grossly intact via light touch bilateral. Vibratory intact via tuning fork bilateral. Protective threshold with Semmes Wienstein monofilament slightly diminished to all pedal sites bilateral. Patellar and Achilles deep tendon reflexes 2+ bilateral. No Babinski or clonus  noted bilateral.   Musculoskeletal: No gross boney pedal deformities bilateral. No pain, crepitus, or limitation noted with foot and ankle range of motion bilateral. Muscular strength 5/5 in all groups tested bilateral.  She has limited inversion on evaluation at the level of the subtalar joint left most likely secondary to previous fracture  It appears that her abduction of her foot is associated with external rotation of her thigh.  Gait: Unassisted, Nonantalgic.    Radiographs:  None taken previous ones reviewed  Assessment & Plan:   Assessment: Neuropathy and loss of muscle strength to the thigh particularly on internal rotation    Plan: Refer to neurology for evaluation  and treatment I do think that her neuropathy has to do with her spine issues.     Donnel Venuto T. [place], [place]

## 2020-08-06 ENCOUNTER — Encounter: Payer: Self-pay | Admitting: Neurology

## 2020-08-10 ENCOUNTER — Other Ambulatory Visit: Payer: Self-pay | Admitting: Internal Medicine

## 2020-08-10 DIAGNOSIS — R922 Inconclusive mammogram: Secondary | ICD-10-CM

## 2020-08-17 ENCOUNTER — Other Ambulatory Visit: Payer: Self-pay | Admitting: Internal Medicine

## 2020-08-17 DIAGNOSIS — R922 Inconclusive mammogram: Secondary | ICD-10-CM

## 2020-08-31 ENCOUNTER — Ambulatory Visit
Admission: EM | Admit: 2020-08-31 | Discharge: 2020-08-31 | Disposition: A | Discharge status: Home / Self Care | Payer: PPO | Attending: Family Medicine | Admitting: Family Medicine

## 2020-08-31 ENCOUNTER — Ambulatory Visit (INDEPENDENT_AMBULATORY_CARE_PROVIDER_SITE_OTHER): Payer: PPO

## 2020-08-31 ENCOUNTER — Other Ambulatory Visit: Payer: Self-pay

## 2020-08-31 DIAGNOSIS — R059 Cough, unspecified: Secondary | ICD-10-CM

## 2020-08-31 DIAGNOSIS — R197 Diarrhea, unspecified: Secondary | ICD-10-CM | POA: Diagnosis not present

## 2020-08-31 DIAGNOSIS — R7989 Other specified abnormal findings of blood chemistry: Secondary | ICD-10-CM | POA: Insufficient documentation

## 2020-08-31 DIAGNOSIS — G5793 Unspecified mononeuropathy of bilateral lower limbs: Secondary | ICD-10-CM | POA: Diagnosis not present

## 2020-08-31 DIAGNOSIS — R262 Difficulty in walking, not elsewhere classified: Secondary | ICD-10-CM | POA: Diagnosis not present

## 2020-08-31 DIAGNOSIS — Z6841 Body Mass Index (BMI) 40.0 and over, adult: Secondary | ICD-10-CM | POA: Diagnosis not present

## 2020-08-31 LAB — CBC WITH DIFFERENTIAL/PLATELET
Abs Immature Granulocytes: 0.03 10*3/uL (ref 0.00–0.07)
Basophils Absolute: 0 10*3/uL (ref 0.0–0.1)
Basophils Relative: 0 %
Eosinophils Absolute: 0.1 10*3/uL (ref 0.0–0.5)
Eosinophils Relative: 1 %
HCT: 40.2 % (ref 36.0–46.0)
Hemoglobin: 13 g/dL (ref 12.0–15.0)
Immature Granulocytes: 0 %
Lymphocytes Relative: 10 %
Lymphs Abs: 1.1 10*3/uL (ref 0.7–4.0)
MCH: 27.5 pg (ref 26.0–34.0)
MCHC: 32.3 g/dL (ref 30.0–36.0)
MCV: 85.2 fL (ref 80.0–100.0)
Monocytes Absolute: 0.7 10*3/uL (ref 0.1–1.0)
Monocytes Relative: 7 %
Neutro Abs: 8.9 10*3/uL — ABNORMAL HIGH (ref 1.7–7.7)
Neutrophils Relative %: 82 %
Platelets: 244 10*3/uL (ref 150–400)
RBC: 4.72 MIL/uL (ref 3.87–5.11)
RDW: 13.6 % (ref 11.5–15.5)
WBC: 10.8 10*3/uL — ABNORMAL HIGH (ref 4.0–10.5)
nRBC: 0 % (ref 0.0–0.2)

## 2020-08-31 LAB — COMPREHENSIVE METABOLIC PANEL
ALT: 158 U/L — ABNORMAL HIGH (ref 0–44)
AST: 113 U/L — ABNORMAL HIGH (ref 15–41)
Albumin: 4.3 g/dL (ref 3.5–5.0)
Alkaline Phosphatase: 126 U/L (ref 38–126)
Anion gap: 3 — ABNORMAL LOW (ref 5–15)
BUN: 21 mg/dL (ref 8–23)
CO2: 30 mmol/L (ref 22–32)
Calcium: 9.5 mg/dL (ref 8.9–10.3)
Chloride: 98 mmol/L (ref 98–111)
Creatinine, Ser: 0.78 mg/dL (ref 0.44–1.00)
GFR, Estimated: >60 mL/min (ref >60)
Glucose, Bld: 144 mg/dL — ABNORMAL HIGH (ref 70–99)
Potassium: 4.4 mmol/L (ref 3.5–5.1)
Sodium: 131 mmol/L — ABNORMAL LOW (ref 135–145)
Total Bilirubin: 0.6 mg/dL (ref 0.3–1.2)
Total Protein: 7.8 g/dL (ref 6.5–8.1)

## 2020-08-31 LAB — C DIFFICILE QUICK SCREEN W PCR REFLEX
C Diff antigen: NEGATIVE
C Diff interpretation: NOT DETECTED
C Diff toxin: NEGATIVE

## 2020-08-31 MED ORDER — BENZONATATE 100 MG PO CAPS
100.0000 mg | ORAL_CAPSULE | Freq: Three times a day (TID) | ORAL | 0 refills | Status: DC | PRN
Start: 2020-08-31 — End: 2020-10-22

## 2020-08-31 NOTE — Discharge Instructions (Addendum)
Return with stool sample.   Medication as need for cough.  If she worsens, go to the ER.  She needs follow up later this week.  Take care  Dr. Lacinda Axon

## 2020-08-31 NOTE — ED Triage Notes (Addendum)
Patient presents to Urgent Care with complaints of abdominal pain and diarrhea (6-8 episodes a day) since Saturday. Pt completed 2 courses of antibiotics two weeks ago for UTI. Treating diarrhea with imodium. She also complains of a cough x 2 weeks. Treating cough with albuterol and neb treatment. She states she met with her pulmonologist last week who prescribed some type of device she's unsure of name but she has not ordered it yet. Son concerned with pneumonia.   Denies fever or urinary symptoms.

## 2020-08-31 NOTE — ED Provider Notes (Signed)
[facility]    CSN: [id number] Arrival date & time: 08/31/20  1028      History   Chief Complaint Chief Complaint  Patient presents with   Diarrhea    HPI  86 year old female with known dyspnea on exertion and COPD presents with diarrhea and cough.  Patient reports ongoing cough.  She states that "I have mucus in my chest".  Patient and her son are concerned about the possibility of pneumonia.  No fever.  No increasing shortness of breath.  Patient follows with pulmonology.  She has yet to get a flutter valve as was previously recommended.  Additionally, patient reports ongoing diarrhea since Saturday.  She reports that she is having 6-8 episodes of diarrhea a day.  She has had recent antibiotic therapy for UTIs.  She reports some mild abdominal cramping.  Son states that she seems to be more weak and fatigued.   Past Medical History:  Diagnosis Date   Allergy    Asthma    Bowel obstruction ([place]) 26/9485   Complication of anesthesia    COPD (chronic obstructive pulmonary disease) ([facility])    GERD (gastroesophageal reflux disease)    Heart murmur    Interstitial cystitis    PONV (postoperative nausea and vomiting)    after 1st cataract surgery   Sleep apnea    mild-no cpap     Patient Active Problem List   Diagnosis Date Noted   Ambulatory dysfunction 10/29/2019   Lumbar stenosis without neurogenic claudication 09/16/2019   Chronic bilateral low back pain with bilateral sciatica 09/02/2019   H/O kyphoplasty 07/26/2019   Ileus ([place])    Constipation    Compression fracture of T12 vertebra ([place])    Colitis 06/27/2019   Generalized osteoarthritis of multiple sites 10/24/2018   Gastroesophageal reflux disease 10/24/2018   Major depressive disorder, recurrent, in remission ([place]) 10/24/2018   Pain in joint of left shoulder 07/13/2018   Primary localized osteoarthrosis of ankle and foot 06/13/2018   Morbid obesity with BMI of 40.0-44.9, adult ([place]) 03/29/2018    Chronic venous insufficiency 07/04/2016   Lymphedema 07/04/2016   Asthma 07/04/2016   Hyperlipidemia 07/04/2016   Mechanical breakdown of implanted electronic neurostimulator of peripheral nerve ([place]) 04/27/2016   Dysuria 02/10/2016   Trochanteric bursitis of both hips 04/13/2015   Tendinitis of left rotator cuff 04/01/2015   Hyperuricuria 06/05/2014   Pyuria 06/05/2014   Urinary tract infection 06/05/2014   Anxiety disorder 05/28/2014   Rheumatoid arthritis ([place]) 12/16/2013   Unspecified osteoarthritis, unspecified site 12/16/2013   Cough 09/11/2013   Chronic interstitial cystitis 12/21/2012   Female stress incontinence 12/21/2012   Incomplete emptying of bladder 12/21/2012   Increased frequency of urination 12/21/2012   Nocturia 12/21/2012   Other symptoms and signs involving the genitourinary system 12/21/2012    Past Surgical History:  Procedure Laterality Date   ABDOMINAL HYSTERECTOMY     APPENDECTOMY     BLADDER SURGERY     BREAST BIOPSY Right 04/24/2020   stereo bx, x clip, path pending    CHOLECYSTECTOMY     EYE SURGERY     bil cataracts   KYPHOPLASTY N/A 07/05/2019   Procedure: T12 KYPHOPLASTY;  Surgeon: Hessie Knows, MD;  Location: [facility];  Service: Orthopedics;  Laterality: N/A;   KYPHOPLASTY N/A 07/23/2019   Procedure: L1 KYPHOPLASTY;  Surgeon: Hessie Knows, MD;  Location: [facility];  Service: Orthopedics;  Laterality: N/A;   TONSILLECTOMY      OB History   No  obstetric history on file.      Home Medications    Prior to Admission medications   Medication Sig Start Date End Date Taking? Authorizing Provider  benzonatate (TESSALON) 100 MG capsule Take 1 capsule (100 mg total) by mouth 3 (three) times daily as needed. 08/31/20  Yes Amaar Oshita G, DO  albuterol (VENTOLIN HFA) 108 (90 Base) MCG/ACT inhaler Inhale 1-2 puffs into the lungs every 6 (six) hours as needed for wheezing or shortness of breath.    [provider]  aspirin EC 81 MG tablet  Take 81 mg by mouth daily.  04/25/12   [provider]  azelastine (ASTELIN) 0.1 % nasal spray Place 1 spray into both nostrils 2 (two) times daily. Use in each nostril as directed    [provider]  Calcium Carbonate (CALCIUM 500 PO) Take 500 mg by mouth daily.    [provider]  cetirizine (ZYRTEC) 10 MG tablet Take 10 mg by mouth every morning.     [provider]  cholecalciferol (VITAMIN D) 25 MCG (1000 UNIT) tablet Take 1,000 Units by mouth daily.    [provider]  docusate sodium (COLACE) 100 MG capsule Take 1 capsule (100 mg total) by mouth 2 (two) times daily. 06/29/19   Fritzi Mandes, MD  doxepin (SINEQUAN) 10 MG capsule Take 10 mg by mouth at bedtime. 07/20/20   [provider]  estradiol (ESTRACE) 0.1 MG/GM vaginal cream Place 1 Applicatorful vaginally 3 (three) times a week.    [provider]  fluticasone (FLONASE) 50 MCG/ACT nasal spray Place 1 spray into both nostrils in the morning and at bedtime.     [provider]  gabapentin (NEURONTIN) 100 MG capsule Take by mouth. 06/09/20   [provider]  hydroxychloroquine (PLAQUENIL) 200 MG tablet Take 100 mg by mouth daily.     [provider]  lidocaine (LIDODERM) 5 % Place 1 patch onto the skin daily. Remove & Discard patch within 12 hours or as directed by MD    [provider]  LINZESS 145 MCG CAPS capsule Take 145 mcg by mouth daily as needed (constipation.).  06/25/19   [provider]  lovastatin (MEVACOR) 20 MG tablet Take 20 mg by mouth daily with supper.     [provider]  montelukast (SINGULAIR) 10 MG tablet Take 10 mg by mouth every evening.     [provider]  olopatadine (PATANOL) 0.1 % ophthalmic solution Place 1 drop into both eyes 2 (two) times daily as needed for allergies.     [provider]  omeprazole (PRILOSEC) 20 MG capsule Take 20 mg by mouth in the morning and at bedtime.      [provider]  ondansetron (ZOFRAN-ODT) 4 MG disintegrating tablet Take 4 mg by mouth every 8 (eight) hours as needed for nausea/vomiting. 07/01/19   [provider]  polyethylene glycol (MIRALAX / GLYCOLAX) 17 g packet Take 17 g by mouth 2 (two) times daily. 06/29/19   Fritzi Mandes, MD  pramipexole (MIRAPEX) 0.5 MG tablet Take 0.5 mg by mouth 3 (three) times daily.    [provider]  SYMBICORT 160-4.5 MCG/ACT inhaler Inhale 2 puffs into the lungs in the morning and at bedtime.     [provider]  trospium (SANCTURA) 20 MG tablet Take 20 mg by mouth at bedtime.    [provider]    Family History Family History  Problem Relation Age of Onset   Heart disease  Mother    Stroke Father    Breast cancer Neg Hx     Social History Social History   Tobacco Use   Smoking status: Never   Smokeless tobacco: Never  Vaping Use   Vaping Use: Never used  Substance Use Topics   Alcohol use: No   Drug use: No     Allergies   Penicillins, Morphine and related, Chlorzoxazone, Ciprofloxacin, Clarithromycin, Clindamycin hcl, Codeine sulfate, Conjugated estrogens, Hydrocodone bit-homatrop mbr, Hydromorphone, Lidocaine, Meperidine, Propoxyphene, Sulfa antibiotics, and Tioconazole   Review of Systems Review of Systems Per HPI  Physical Exam Triage Vital Signs ED Triage Vitals  Enc Vitals Group     BP 08/31/20 1149 (!) 147/71     Pulse Rate 08/31/20 1149 100     Resp 08/31/20 1149 18     Temp 08/31/20 1149 98.6 F (37 C)     Temp Source 08/31/20 1149 Oral     SpO2 08/31/20 1149 96 %     Weight --      Height --      Head Circumference --      Peak Flow --      Pain Score 08/31/20 1145 4     Pain Loc --      Pain Edu? --      Excl. in [place]? --    Updated Vital Signs BP (!) 147/71 (BP Location: Right Arm)   Pulse 100   Temp 98.6 F (37 C) (Oral)   Resp 18   SpO2 96%   Visual Acuity Right Eye Distance:   Left Eye Distance:    Bilateral Distance:    Right Eye Near:   Left Eye Near:    Bilateral Near:     Physical Exam Vitals and nursing note reviewed.  Constitutional:      General: She is not in acute distress.    Appearance: She is obese.  HENT:     Head: Normocephalic and atraumatic.  Eyes:     General:        Right eye: No discharge.        Left eye: No discharge.     Conjunctiva/sclera: Conjunctivae normal.  Cardiovascular:     Rate and Rhythm: Normal rate.     Heart sounds: Murmur heard.  Pulmonary:     Effort: Pulmonary effort is normal.     Breath sounds: Rales present.  Abdominal:     Palpations: Abdomen is soft.     Tenderness: There is no abdominal tenderness.  Neurological:     Mental Status: She is alert.  Psychiatric:        Mood and Affect: Mood normal.        Behavior: Behavior normal.     UC Treatments / Results  Labs (all labs ordered are listed, but only abnormal results are displayed) Labs Reviewed  CBC WITH DIFFERENTIAL/PLATELET - Abnormal; Notable for the following components:      Result Value   WBC 10.8 (*)    Neutro Abs 8.9 (*)    All other components within normal limits  COMPREHENSIVE METABOLIC PANEL - Abnormal; Notable for the following components:   Sodium 131 (*)    Glucose, Bld 144 (*)    AST 113 (*)    ALT 158 (*)    Anion gap 3 (*)    All other components within normal limits    EKG   Radiology DG Chest 2 View  Result Date: 08/31/2020 CLINICAL DATA:  Cough. EXAM:  CHEST - 2 VIEW COMPARISON:  Jul 15, 2019. FINDINGS: The heart size and mediastinal contours are within normal limits. Both lungs are clear. Elevated right hemidiaphragm is noted. The visualized skeletal structures are unremarkable. IMPRESSION: No active cardiopulmonary disease. Aortic Atherosclerosis (ICD10-I70.0). Electronically Signed   By: Marijo Conception M.D.   On: 08/31/2020 12:50    Procedures Procedures (including critical care time)  Medications Ordered in UC Medications -  No data to display  Initial Impression / Assessment and Plan / UC Course  I have reviewed the triage vital signs and the nursing notes.  Pertinent labs & imaging results that were available during my care of the patient were reviewed by me and considered in my medical decision making (see chart for details).    86 year old female presents with cough and diarrhea.  Chest x-ray was obtained and was independently reviewed by me.  Interpretation: Elevated right hemidiaphragm.  This is chronic.  No apparent infiltrate.  Laboratory studies obtained given ongoing diarrhea.  Labs notable for elevated LFTs and mildly elevated white count of 10.8.  Tessalon Perles for cough.  Patient sent home with kit to obtain stool.  Patient will return so that this can be tested for C. difficile.  If she worsens she is to go to the ER.  She needs follow-up this week.  Final Clinical Impressions(s) / UC Diagnoses   Final diagnoses:  Diarrhea, unspecified type  Cough  Elevated LFTs     Discharge Instructions      Return with stool sample.   Medication as need for cough.  If she worsens, go to the ER.  She needs follow up later this week.  Take care  Dr. Lacinda Axon    ED Prescriptions     Medication Sig Dispense Auth. Provider   benzonatate (TESSALON) 100 MG capsule Take 1 capsule (100 mg total) by mouth 3 (three) times daily as needed. 30 capsule Coral Spikes, DO      PDMP not reviewed this encounter.   Coral Spikes, [place] 08/31/20 1422

## 2020-09-09 DIAGNOSIS — R197 Diarrhea, unspecified: Secondary | ICD-10-CM | POA: Diagnosis not present

## 2020-09-15 DIAGNOSIS — L218 Other seborrheic dermatitis: Secondary | ICD-10-CM | POA: Diagnosis not present

## 2020-09-15 DIAGNOSIS — L570 Actinic keratosis: Secondary | ICD-10-CM | POA: Diagnosis not present

## 2020-09-21 DIAGNOSIS — M48062 Spinal stenosis, lumbar region with neurogenic claudication: Secondary | ICD-10-CM | POA: Diagnosis not present

## 2020-09-25 ENCOUNTER — Encounter: Payer: Self-pay | Admitting: Neurology

## 2020-09-25 ENCOUNTER — Other Ambulatory Visit: Payer: Self-pay

## 2020-09-25 ENCOUNTER — Ambulatory Visit: Payer: PPO | Admitting: Neurology

## 2020-09-25 VITALS — BP 151/75 | HR 96 | Resp 18 | Ht 59.0 in

## 2020-09-25 DIAGNOSIS — M48061 Spinal stenosis, lumbar region without neurogenic claudication: Secondary | ICD-10-CM | POA: Diagnosis not present

## 2020-09-25 DIAGNOSIS — R252 Cramp and spasm: Secondary | ICD-10-CM | POA: Diagnosis not present

## 2020-09-25 DIAGNOSIS — R200 Anesthesia of skin: Secondary | ICD-10-CM | POA: Diagnosis not present

## 2020-09-25 DIAGNOSIS — R202 Paresthesia of skin: Secondary | ICD-10-CM

## 2020-09-25 NOTE — Patient Instructions (Addendum)
Follow-up with Dr. Rudene Christians to evaluate your low back

## 2020-09-25 NOTE — Progress Notes (Signed)
[facility] Clinic Note - Initial Visit   Date: 09/25/20  [name] MRN: [id number] DOB: [birth year]   Dear Dr. Milinda Pointer:   Thank you for your kind referral of Amber Kirk for consultation of feet numbnes. Although her history is well known to you, please allow [name] to reiterate it for the purpose of our medical record. The patient was accompanied to the clinic by son who also provides collateral information.     History of Present Illness: Amber Kirk is a 86 y.o. right-handed female with COPD, GERD, RA, lumbar fracture s/p kyphoplasty presenting for evaluation of feet numbness and left foot stiffness.   Starting around 2021, she began having numbness/tingling of the toes in the feet. It involves the last three toes and is constant.  She has numbness at night time. She takes gabapentin '100mg'$  in the morning and '200mg'$  at bedtime which alleviates some of the pain. No history of diabetes, alcohol use, chemotherapy exposure, or family history.   Over the past 2-3 months, she noticed difficulty moving her left foot and stiffness.  She has chronic low back pain and had two back surgeries for compression fracture by Dr. Rudene Christians at [facility]   She cannot get MRI due to bladder stimulator. CT lumbar spine from July 2021 shows spinal canal narrowing at T12 with some cord impingement.  She denies bowel/bladder incontinence.  She admits to leg heaviness and worsening back pain with prolonged standing.  She has been using a wheelchair since last 2021 after being hospitalized for bowel obstruction.     She lives alone and has caregiver that comes daily from 10a - 2p and 9p - 7a.  She manages her own medications and finances.    Out-side paper records, electronic medical record, and images have been reviewed where available and summarized as:  CT lumbar spine wo contrast 09/12/2019: At the previously augmented level of T12, it  appears that there has been slight further vertebral collapse around the methylmethacrylate. There is retropulsion of 7 mm, encroaching upon the spinal canal with some deformity of the distal cord.   At the recently augmented level of L1, there has been slight further collapse of the vertebral body but no retropulsion. Small amount of right para spinous soft tissue cement.   Ordinary mild chronic degenerative changes below that without appreciable change since May.  Past Medical History:  Diagnosis Date   Allergy    Asthma    Bowel obstruction ([place]) Q000111Q   Complication of anesthesia    COPD (chronic obstructive pulmonary disease) (HCC)    GERD (gastroesophageal reflux disease)    Heart murmur    Interstitial cystitis    PONV (postoperative nausea and vomiting)    after 1st cataract surgery   Sleep apnea    mild-no cpap     Past Surgical History:  Procedure Laterality Date   ABDOMINAL HYSTERECTOMY     APPENDECTOMY     BLADDER SURGERY     BREAST BIOPSY Right 04/24/2020   stereo bx, x clip, path pending    CHOLECYSTECTOMY     EYE SURGERY     bil cataracts   KYPHOPLASTY N/A 07/05/2019   Procedure: T12 KYPHOPLASTY;  Surgeon: Hessie Knows, MD;  Location: [facility] ORS;  Service: Orthopedics;  Laterality: N/A;   KYPHOPLASTY N/A 07/23/2019   Procedure: L1 KYPHOPLASTY;  Surgeon: Hessie Knows, MD;  Location: [facility] ORS;  Service: Orthopedics;  Laterality: N/A;   TONSILLECTOMY  Medications:  Outpatient Encounter Medications as of 09/25/2020  Medication Sig   albuterol (VENTOLIN HFA) 108 (90 Base) MCG/ACT inhaler Inhale 1-2 puffs into the lungs every 6 (six) hours as needed for wheezing or shortness of breath.   aspirin EC 81 MG tablet Take 81 mg by mouth daily.    azelastine (ASTELIN) 0.1 % nasal spray Place 1 spray into both nostrils 2 (two) times daily. Use in each nostril as directed   Calcium Carbonate (CALCIUM 500 PO) Take 500 mg by mouth daily.   cetirizine (ZYRTEC) 10  MG tablet Take 10 mg by mouth every morning.    cholecalciferol (VITAMIN D) 25 MCG (1000 UNIT) tablet Take 1,000 Units by mouth daily.   docusate sodium (COLACE) 100 MG capsule Take 1 capsule (100 mg total) by mouth 2 (two) times daily.   doxepin (SINEQUAN) 10 MG capsule Take 10 mg by mouth at bedtime.   estradiol (ESTRACE) 0.1 MG/GM vaginal cream Place 1 Applicatorful vaginally 3 (three) times a week.   fluticasone (FLONASE) 50 MCG/ACT nasal spray Place 1 spray into both nostrils in the morning and at bedtime.    gabapentin (NEURONTIN) 100 MG capsule Take 100 mg by mouth 2 (two) times daily. 1 am and 2 tabs at evening   hydroxychloroquine (PLAQUENIL) 200 MG tablet Take 100 mg by mouth daily.    lidocaine (LIDODERM) 5 % Place 1 patch onto the skin daily. Remove & Discard patch within 12 hours or as directed by MD   LINZESS 145 MCG CAPS capsule Take 145 mcg by mouth daily as needed (constipation.).    lovastatin (MEVACOR) 20 MG tablet Take 20 mg by mouth daily with supper.    montelukast (SINGULAIR) 10 MG tablet Take 10 mg by mouth every evening.    olopatadine (PATANOL) 0.1 % ophthalmic solution Place 1 drop into both eyes 2 (two) times daily as needed for allergies.    omeprazole (PRILOSEC) 20 MG capsule Take 20 mg by mouth in the morning and at bedtime.    ondansetron (ZOFRAN-ODT) 4 MG disintegrating tablet Take 4 mg by mouth every 8 (eight) hours as needed for nausea/vomiting.   polyethylene glycol (MIRALAX / GLYCOLAX) 17 g packet Take 17 g by mouth 2 (two) times daily.   SYMBICORT 160-4.5 MCG/ACT inhaler Inhale 2 puffs into the lungs in the morning and at bedtime.    trospium (SANCTURA) 20 MG tablet Take 20 mg by mouth at bedtime.   benzonatate (TESSALON) 100 MG capsule Take 1 capsule (100 mg total) by mouth 3 (three) times daily as needed. (Patient not taking: Reported on 09/25/2020)   pramipexole (MIRAPEX) 0.5 MG tablet Take 0.5 mg by mouth 3 (three) times daily. (Patient not taking: Reported  on 09/25/2020)   No facility-administered encounter medications on file as of 09/25/2020.    Allergies:  Allergies  Allergen Reactions   Penicillins Hives, Rash and Other (See Comments)    And itching   Morphine And Related Nausea And Vomiting and Nausea Only   Chlorzoxazone Nausea And Vomiting   Ciprofloxacin Nausea And Vomiting   Clarithromycin Nausea And Vomiting   Clindamycin Hcl Nausea And Vomiting   Codeine Sulfate Nausea And Vomiting   Conjugated Estrogens Nausea And Vomiting   Hydrocodone Bit-Homatrop Mbr Nausea And Vomiting   Hydromorphone Nausea And Vomiting   Lidocaine Nausea And Vomiting   Meperidine Nausea And Vomiting   Propoxyphene Nausea And Vomiting   Sulfa Antibiotics Nausea And Vomiting   Tioconazole Nausea Only  Family History: Family History  Problem Relation Age of Onset   Heart disease Mother    Stroke Father    Breast cancer Neg Hx     Social History: Social History   Tobacco Use   Smoking status: Never   Smokeless tobacco: Never  Vaping Use   Vaping Use: Never used  Substance Use Topics   Alcohol use: No   Drug use: No   Social History   Social History Narrative   Not on file    Vital Signs:  BP (!) 151/75   Pulse 96   Resp 18   Ht '4\' 11"'$  (1.499 m)   SpO2 98%   BMI 40.40 kg/m   Neurological Exam: MENTAL STATUS including orientation to time, place, person, recent and remote memory, attention span and concentration, language, and fund of knowledge is normal.  Speech is not dysarthric.  CRANIAL NERVES: II:  No visual field defects.   III-IV-VI: Pupils equal round and reactive to light.  Restricted upgaze, otherwise normal conjugate, extra-ocular eye movements.  No nystagmus.  No ptosis.   V:  Normal facial sensation.    VII:  Normal facial symmetry and movements.   VIII:  Normal hearing and vestibular function.   IX-X:  Normal palatal movement.   XI:  Normal shoulder shrug and head rotation.   XII:  Normal tongue strength  and range of motion, no deviation or fasciculation.  MOTOR:  Left foot with increased tone and reduced ROM in all directions, strength is 5/5.  No atrophy, fasciculations or abnormal movements.  No pronator drift.   Upper Extremity:  Right  Left  Deltoid  5/5   5/5   Biceps  5/5   5/5   Triceps  5/5   5/5   Infraspinatus 5/5  5/5  Medial pectoralis 5/5  5/5  Wrist extensors  5/5   5/5   Wrist flexors  5/5   5/5   Finger extensors  5/5   5/5   Finger flexors  5/5   5/5   Dorsal interossei  5/5   5/5   Abductor pollicis  5/5   5/5   Tone (Ashworth scale)  0  0   Lower Extremity:  Right  Left  Hip flexors  5/5   5/5   Hip extensors  5/5   5/5   Adductor 5/5  5/5  Abductor 5/5  5/5  Knee flexors  5/5   5/5   Knee extensors  5/5   5/5   Dorsiflexors  5/5   5/5   Plantarflexors  5/5   5/5   Toe extensors  5/5   5/5   Toe flexors  5/5   5/5   Tone (Ashworth scale)  0  1   MSRs:  Right        Left                  brachioradialis 2+  2+  biceps 2+  2+  triceps 2+  2+  patellar 2+  3+  ankle jerk 2+  3+  Hoffman no  no  plantar response down  up   SENSORY:  Normal and symmetric perception of light touch, pinprick, vibration, and proprioception.    COORDINATION/GAIT: Normal finger-to- nose-finger.  Finger tapping intact.  Slowed toe tapping on the left. Gait not tested, patient arrived in wheelchair.   IMPRESSION: Left foot spasticity in the setting of possible lumbar canal stenosis with neurogenic claudication.  Exam shows asymmetrically  brisk reflexes in the left leg, spasticity, extensor babinski with normal motor strength.  - Recommend follow-up with Dr. Rudene Christians to evaluate for lumbar canal stenosis  - Follow-up with PM&R for spasticity management  2.  Bilateral toe numbness may be early and distal neuropathy. No risk factors for neuropathy.   - NCS/EMG discussed, but she elected to hold on testing unless symptoms get worse  - Patient informed that if numbness does not  respond to medications, such as gabapentin  Return to clinic as needed  Thank you for allowing me to participate in patient's care.  If I can answer any additional questions, I would be pleased to do so.    Sincerely,    Tejay Hubert K. Posey Pronto, DO

## 2020-09-30 DIAGNOSIS — G5793 Unspecified mononeuropathy of bilateral lower limbs: Secondary | ICD-10-CM | POA: Diagnosis not present

## 2020-09-30 DIAGNOSIS — Z6841 Body Mass Index (BMI) 40.0 and over, adult: Secondary | ICD-10-CM | POA: Diagnosis not present

## 2020-09-30 DIAGNOSIS — R262 Difficulty in walking, not elsewhere classified: Secondary | ICD-10-CM | POA: Diagnosis not present

## 2020-10-15 DIAGNOSIS — M5442 Lumbago with sciatica, left side: Secondary | ICD-10-CM | POA: Diagnosis not present

## 2020-10-15 DIAGNOSIS — M48062 Spinal stenosis, lumbar region with neurogenic claudication: Secondary | ICD-10-CM | POA: Diagnosis not present

## 2020-10-15 DIAGNOSIS — M5441 Lumbago with sciatica, right side: Secondary | ICD-10-CM | POA: Diagnosis not present

## 2020-10-15 DIAGNOSIS — G8929 Other chronic pain: Secondary | ICD-10-CM | POA: Diagnosis not present

## 2020-10-19 DIAGNOSIS — M47816 Spondylosis without myelopathy or radiculopathy, lumbar region: Secondary | ICD-10-CM | POA: Diagnosis not present

## 2020-10-22 ENCOUNTER — Ambulatory Visit
Admission: EM | Admit: 2020-10-22 | Discharge: 2020-10-22 | Disposition: A | Discharge status: Home / Self Care | Payer: PPO | Attending: Family Medicine | Admitting: Family Medicine

## 2020-10-22 ENCOUNTER — Other Ambulatory Visit: Payer: Self-pay

## 2020-10-22 DIAGNOSIS — U071 COVID-19: Secondary | ICD-10-CM | POA: Diagnosis not present

## 2020-10-22 LAB — RESP PANEL BY RT-PCR (FLU A&B, COVID) ARPGX2
Influenza A by PCR: NEGATIVE
Influenza B by PCR: NEGATIVE
SARS Coronavirus 2 by RT PCR: POSITIVE — AB

## 2020-10-22 MED ORDER — NIRMATRELVIR/RITONAVIR (PAXLOVID)TABLET: 3.0000 | ORAL_TABLET | Freq: Two times a day (BID) | ORAL | 0 refills | Status: AC

## 2020-10-22 MED ORDER — IPRATROPIUM BROMIDE 0.06 % NA SOLN: 2.0000 | Freq: Four times a day (QID) | NASAL | 12 refills | Status: DC

## 2020-10-22 MED ORDER — NIRMATRELVIR/RITONAVIR (PAXLOVID)TABLET: 3.0000 | ORAL_TABLET | Freq: Two times a day (BID) | ORAL | 0 refills | Status: DC

## 2020-10-22 MED ORDER — BENZONATATE 100 MG PO CAPS: 200.0000 mg | ORAL_CAPSULE | Freq: Three times a day (TID) | ORAL | 0 refills | Status: DC | PRN

## 2020-10-22 NOTE — ED Provider Notes (Signed)
[facility]    CSN: [id number] Arrival date & time: 10/22/20  1108      History   Chief Complaint Chief Complaint  Patient presents with   Cough    HPI Amber Kirk is a 86 y.o. female.   HPI  86 year old female here for evaluation of respiratory complaints.  Reports that she has been having a cough and some shortness of breath since yesterday.  She states that she does have COPD and typically coughs instead of wheezes.  She states that her sister came to visit from [place] last week and then tested positive for COVID yesterday.  Patient is also one of her caregivers that takes care of her overnight test positive for COVID today.  She is here to be tested.  She reports that her cough is nonproductive for the most part but she does on occasion bring up some mucus.  She has had nasal congestion and runny nose for clear nasal discharge.  She denies wheezing, fever, sore throat, or GI complaints.  Past Medical History:  Diagnosis Date   Allergy    Asthma    Bowel obstruction ([place]) Q000111Q   Complication of anesthesia    COPD (chronic obstructive pulmonary disease) (HCC)    GERD (gastroesophageal reflux disease)    Heart murmur    Interstitial cystitis    PONV (postoperative nausea and vomiting)    after 1st cataract surgery   Sleep apnea    mild-no cpap     Patient Active Problem List   Diagnosis Date Noted   Ambulatory dysfunction 10/29/2019   Lumbar stenosis without neurogenic claudication 09/16/2019   Chronic bilateral low back pain with bilateral sciatica 09/02/2019   H/O kyphoplasty 07/26/2019   Ileus ([place])    Constipation    Compression fracture of T12 vertebra ([place])    Colitis 06/27/2019   Generalized osteoarthritis of multiple sites 10/24/2018   Gastroesophageal reflux disease 10/24/2018   Major depressive disorder, recurrent, in remission ([place]) 10/24/2018   Pain in joint of left shoulder 07/13/2018   Primary localized  osteoarthrosis of ankle and foot 06/13/2018   Morbid obesity with BMI of 40.0-44.9, adult ([place]) 03/29/2018   Chronic venous insufficiency 07/04/2016   Lymphedema 07/04/2016   Asthma 07/04/2016   Hyperlipidemia 07/04/2016   Mechanical breakdown of implanted electronic neurostimulator of peripheral nerve ([place]) 04/27/2016   Dysuria 02/10/2016   Trochanteric bursitis of both hips 04/13/2015   Tendinitis of left rotator cuff 04/01/2015   Hyperuricuria 06/05/2014   Pyuria 06/05/2014   Urinary tract infection 06/05/2014   Anxiety disorder 05/28/2014   Rheumatoid arthritis ([place]) 12/16/2013   Unspecified osteoarthritis, unspecified site 12/16/2013   Cough 09/11/2013   Chronic interstitial cystitis 12/21/2012   Female stress incontinence 12/21/2012   Incomplete emptying of bladder 12/21/2012   Increased frequency of urination 12/21/2012   Nocturia 12/21/2012   Other symptoms and signs involving the genitourinary system 12/21/2012    Past Surgical History:  Procedure Laterality Date   ABDOMINAL HYSTERECTOMY     APPENDECTOMY     BLADDER SURGERY     BREAST BIOPSY Right 04/24/2020   stereo bx, x clip, path pending    CHOLECYSTECTOMY     EYE SURGERY     bil cataracts   KYPHOPLASTY N/A 07/05/2019   Procedure: T12 KYPHOPLASTY;  Surgeon: Hessie Knows, MD;  Location: [facility] ORS;  Service: Orthopedics;  Laterality: N/A;   KYPHOPLASTY N/A 07/23/2019   Procedure: L1 KYPHOPLASTY;  Surgeon: Hessie Knows, MD;  Location: [facility] ORS;  Service: Orthopedics;  Laterality: N/A;   TONSILLECTOMY      OB History   No obstetric history on file.      Home Medications    Prior to Admission medications   Medication Sig Start Date End Date Taking? Authorizing Provider  albuterol (VENTOLIN HFA) 108 (90 Base) MCG/ACT inhaler Inhale 1-2 puffs into the lungs every 6 (six) hours as needed for wheezing or shortness of breath.   Yes [provider]  aspirin EC 81 MG tablet Take 81 mg by mouth daily.   04/25/12  Yes [provider]  azelastine (ASTELIN) 0.1 % nasal spray Place 1 spray into both nostrils 2 (two) times daily. Use in each nostril as directed   Yes [provider]  Calcium Carbonate (CALCIUM 500 PO) Take 500 mg by mouth daily.   Yes [provider]  cetirizine (ZYRTEC) 10 MG tablet Take 10 mg by mouth every morning.    Yes [provider]  cholecalciferol (VITAMIN D) 25 MCG (1000 UNIT) tablet Take 1,000 Units by mouth daily.   Yes [provider]  docusate sodium (COLACE) 100 MG capsule Take 1 capsule (100 mg total) by mouth 2 (two) times daily. 06/29/19  Yes Fritzi Mandes, MD  doxepin (SINEQUAN) 10 MG capsule Take 10 mg by mouth at bedtime. 07/20/20  Yes [provider]  estradiol (ESTRACE) 0.1 MG/GM vaginal cream Place 1 Applicatorful vaginally 3 (three) times a week.   Yes [provider]  fluticasone (FLONASE) 50 MCG/ACT nasal spray Place 1 spray into both nostrils in the morning and at bedtime.    Yes [provider]  gabapentin (NEURONTIN) 100 MG capsule Take 100 mg by mouth 2 (two) times daily. 1 am and 2 tabs at evening 06/09/20  Yes [provider]  hydroxychloroquine (PLAQUENIL) 200 MG tablet Take 100 mg by mouth daily.    Yes [provider]  ipratropium (ATROVENT) 0.06 % nasal spray Place 2 sprays into both nostrils 4 (four) times daily. 10/22/20  Yes Margarette Canada, NP  lidocaine (LIDODERM) 5 % Place 1 patch onto the skin daily. Remove & Discard patch within 12 hours or as directed by MD   Yes [provider]  LINZESS 145 MCG CAPS capsule Take 145 mcg by mouth daily as needed (constipation.).  06/25/19  Yes [provider]  lovastatin (MEVACOR) 20 MG tablet Take 20 mg by mouth daily with supper.    Yes [provider]  montelukast (SINGULAIR) 10 MG tablet Take 10 mg by mouth every evening.    Yes [provider]  nirmatrelvir/ritonavir EUA (PAXLOVID) 20 x 150  MG & 10 x '100MG'$  TABS Take 3 tablets by mouth 2 (two) times daily for 5 days. Patient GFR is >60. Take nirmatrelvir (150 mg) two tablets twice daily for 5 days and ritonavir (100 mg) one tablet twice daily for 5 days. 10/22/20 10/27/20 Yes Margarette Canada, NP  olopatadine (PATANOL) 0.1 % ophthalmic solution Place 1 drop into both eyes 2 (two) times daily as needed for allergies.    Yes [provider]  omeprazole (PRILOSEC) 20 MG capsule Take 20 mg by mouth in the morning and at bedtime.    Yes [provider]  ondansetron (ZOFRAN-ODT) 4 MG disintegrating tablet Take 4 mg by mouth every 8 (eight) hours as needed for nausea/vomiting. 07/01/19  Yes [provider]  polyethylene glycol (MIRALAX / GLYCOLAX) 17 g packet Take 17 g by mouth 2 (two) times  daily. 06/29/19  Yes Fritzi Mandes, MD  SYMBICORT 160-4.5 MCG/ACT inhaler Inhale 2 puffs into the lungs in the morning and at bedtime.    Yes [provider]  trospium (SANCTURA) 20 MG tablet Take 20 mg by mouth at bedtime.   Yes [provider]  benzonatate (TESSALON) 100 MG capsule Take 2 capsules (200 mg total) by mouth 3 (three) times daily as needed. 10/22/20   Margarette Canada, NP    Family History Family History  Problem Relation Age of Onset   Heart disease Mother    Stroke Father    Breast cancer Neg Hx     Social History Social History   Tobacco Use   Smoking status: Never   Smokeless tobacco: Never  Vaping Use   Vaping Use: Never used  Substance Use Topics   Alcohol use: No   Drug use: No     Allergies   Penicillins, Morphine and related, Chlorzoxazone, Ciprofloxacin, Clarithromycin, Clindamycin hcl, Codeine sulfate, Conjugated estrogens, Hydrocodone bit-homatrop mbr, Hydromorphone, Lidocaine, Meperidine, Propoxyphene, Sulfa antibiotics, and Tioconazole   Review of Systems Review of Systems  Constitutional:  Negative for activity change, appetite change and fever.  HENT:  Positive for  congestion and rhinorrhea. Negative for ear pain and sore throat.   Respiratory:  Positive for cough and shortness of breath. Negative for wheezing.   Gastrointestinal:  Negative for diarrhea, nausea and vomiting.  Skin:  Negative for rash.  Hematological: Negative.   Psychiatric/Behavioral: Negative.      Physical Exam Triage Vital Signs ED Triage Vitals  Enc Vitals Group     BP 10/22/20 1142 140/64     Pulse Rate 10/22/20 1142 90     Resp 10/22/20 1142 17     Temp 10/22/20 1142 99.2 F (37.3 C)     Temp Source 10/22/20 1142 Oral     SpO2 10/22/20 1142 96 %     Weight 10/22/20 1144 200 lb (90.7 kg)     Height 10/22/20 1144 '4\' 11"'$  (1.499 m)     Head Circumference --      Peak Flow --      Pain Score 10/22/20 1144 2     Pain Loc --      Pain Edu? --      Excl. in [place]? --    No data found.  Updated Vital Signs BP 140/64 (BP Location: Right Arm)   Pulse 90   Temp 99.2 F (37.3 C) (Oral)   Resp 17   Ht '4\' 11"'$  (1.499 m)   Wt 200 lb (90.7 kg)   SpO2 96%   BMI 40.40 kg/m   Visual Acuity Right Eye Distance:   Left Eye Distance:   Bilateral Distance:    Right Eye Near:   Left Eye Near:    Bilateral Near:     Physical Exam Vitals and nursing note reviewed.  Constitutional:      General: She is not in acute distress.    Appearance: Normal appearance. She is obese. She is not ill-appearing.  HENT:     Head: Normocephalic and atraumatic.     Right Ear: Tympanic membrane, ear canal and external ear normal. There is no impacted cerumen.     Left Ear: Tympanic membrane, ear canal and external ear normal. There is no impacted cerumen.     Nose: Congestion and rhinorrhea present.     Mouth/Throat:     Mouth: Mucous membranes are moist.     Pharynx: Oropharynx is clear.  Posterior oropharyngeal erythema present.  Cardiovascular:     Rate and Rhythm: Normal rate and regular rhythm.     Pulses: Normal pulses.     Heart sounds: Normal heart sounds. No murmur heard.   No  gallop.  Pulmonary:     Effort: Pulmonary effort is normal.     Breath sounds: Normal breath sounds. No wheezing, rhonchi or rales.  Skin:    General: Skin is warm and dry.     Capillary Refill: Capillary refill takes less than 2 seconds.     Findings: No erythema or rash.  Neurological:     General: No focal deficit present.     Mental Status: She is alert and oriented to person, place, and time.  Psychiatric:        Mood and Affect: Mood normal.        Behavior: Behavior normal.        Thought Content: Thought content normal.        Judgment: Judgment normal.     UC Treatments / Results  Labs (all labs ordered are listed, but only abnormal results are displayed) Labs Reviewed  RESP PANEL BY RT-PCR (FLU A&B, COVID) ARPGX2 - Abnormal; Notable for the following components:      Result Value   SARS Coronavirus 2 by RT PCR POSITIVE (*)    All other components within normal limits    EKG   Radiology No results found.  Procedures Procedures (including critical care time)  Medications Ordered in UC Medications - No data to display  Initial Impression / Assessment and Plan / UC Course  I have reviewed the triage vital signs and the nursing notes.  Pertinent labs & imaging results that were available during my care of the patient were reviewed by me and considered in my medical decision making (see chart for details).  Patient is a pleasant, nontoxic-appearing 86 year old female here for evaluation of respiratory complaints as outlined in the HPI above.  Patient's physical exam reveals pearly gray tympanic membranes bilaterally with normal light reflex and clear external auditory canals.  Nasal mucosa is erythematous and edematous with clear nasal discharge.  Posterior oropharynx has mild erythema and cobblestoning with clear postnasal drip.  Cardiopulmonary exam reveals clear lung sounds in all fields.  Due to patient's recent COVID exposure will collect respiratory triplex  panel.  Respiratory triplex panel is positive for COVID.  Will discharge patient with a diagnosis of COVID-19 and started her on Paxlovid.  Patient had a CMP on 08/31/2020 which showed a GFR of greater than 60.  We will also give patient Tessalon Perles to help with cough.  Patient is prescribed albuterol inhaler for her COPD currently and will have her continue to use it, 2 puffs every 4-6 hours, as needed for shortness of breath.   Final Clinical Impressions(s) / UC Diagnoses   Final diagnoses:  [id number]     Discharge Instructions      You will have to quarantine for 5 days from the start of your symptoms.  After 5 days you can break quarantine if your symptoms have improved and you have not had a fever for 24 hours without taking Tylenol or ibuprofen.  Use over-the-counter Tylenol and ibuprofen as needed for body aches and fever.  Take the Paxlovid twice daily for 5 days for treatment of the COVID-19.  Hold your lovastatin while you take this medication and you can resume after you finish the Paxlovid.  Use your albuterol inhaler, 2 puffs  every 4-6 hours, as needed for shortness of breath and wheezing.  Use the Tessalon Perles during the day as needed for cough.  If you develop any increased shortness of breath-especially at rest, you are unable to speak in full sentences, or is a late sign your lips are turning blue you need to go the ER for evaluation.      ED Prescriptions     Medication Sig Dispense Auth. Provider   benzonatate (TESSALON) 100 MG capsule Take 2 capsules (200 mg total) by mouth 3 (three) times daily as needed. 30 capsule Margarette Canada, NP   nirmatrelvir/ritonavir EUA (PAXLOVID) 20 x 150 MG & 10 x '100MG'$  TABS Take 3 tablets by mouth 2 (two) times daily for 5 days. Patient GFR is >60. Take nirmatrelvir (150 mg) two tablets twice daily for 5 days and ritonavir (100 mg) one tablet twice daily for 5 days. 30 tablet Margarette Canada, NP   ipratropium (ATROVENT) 0.06 %  nasal spray Place 2 sprays into both nostrils 4 (four) times daily. 15 mL Margarette Canada, NP      I have reviewed the PDMP during this encounter.   Margarette Canada, NP 10/22/20 1239

## 2020-10-22 NOTE — Discharge Instructions (Addendum)
You will have to quarantine for 5 days from the start of your symptoms.  After 5 days you can break quarantine if your symptoms have improved and you have not had a fever for 24 hours without taking Tylenol or ibuprofen.  Use over-the-counter Tylenol and ibuprofen as needed for body aches and fever.  Take the Paxlovid twice daily for 5 days for treatment of the COVID-19.  Hold your lovastatin while you take this medication and you can resume after you finish the Paxlovid.  Use your albuterol inhaler, 2 puffs every 4-6 hours, as needed for shortness of breath and wheezing.  Use the Tessalon Perles during the day as needed for cough.  If you develop any increased shortness of breath-especially at rest, you are unable to speak in full sentences, or is a late sign your lips are turning blue you need to go the ER for evaluation.

## 2020-10-22 NOTE — ED Triage Notes (Signed)
Patient states that she has been having a cough x yesterday. States that she has COPD. States that she has been using her nebulizer more frequently. States that she has been having some shortness of breath. States that her other caregiver is positive for covid and she would like to be tested.

## 2020-10-27 ENCOUNTER — Inpatient Hospital Stay: Admission: RE | Admit: 2020-10-27 | Payer: PPO | Source: Ambulatory Visit

## 2020-10-28 DIAGNOSIS — L218 Other seborrheic dermatitis: Secondary | ICD-10-CM | POA: Diagnosis not present

## 2020-10-28 DIAGNOSIS — L821 Other seborrheic keratosis: Secondary | ICD-10-CM | POA: Diagnosis not present

## 2020-10-31 DIAGNOSIS — R262 Difficulty in walking, not elsewhere classified: Secondary | ICD-10-CM | POA: Diagnosis not present

## 2020-10-31 DIAGNOSIS — Z6841 Body Mass Index (BMI) 40.0 and over, adult: Secondary | ICD-10-CM | POA: Diagnosis not present

## 2020-10-31 DIAGNOSIS — G5793 Unspecified mononeuropathy of bilateral lower limbs: Secondary | ICD-10-CM | POA: Diagnosis not present

## 2020-11-06 ENCOUNTER — Other Ambulatory Visit: Payer: Self-pay

## 2020-11-06 ENCOUNTER — Ambulatory Visit
Admission: RE | Admit: 2020-11-06 | Discharge: 2020-11-06 | Disposition: A | Discharge status: Home / Self Care | Payer: PPO | Source: Ambulatory Visit | Attending: Internal Medicine | Admitting: Internal Medicine

## 2020-11-06 DIAGNOSIS — R922 Inconclusive mammogram: Secondary | ICD-10-CM

## 2020-12-01 DIAGNOSIS — Z01818 Encounter for other preprocedural examination: Secondary | ICD-10-CM | POA: Diagnosis not present

## 2020-12-01 DIAGNOSIS — G5793 Unspecified mononeuropathy of bilateral lower limbs: Secondary | ICD-10-CM | POA: Diagnosis not present

## 2020-12-01 DIAGNOSIS — R262 Difficulty in walking, not elsewhere classified: Secondary | ICD-10-CM | POA: Diagnosis not present

## 2020-12-01 DIAGNOSIS — Z6841 Body Mass Index (BMI) 40.0 and over, adult: Secondary | ICD-10-CM | POA: Diagnosis not present

## 2020-12-01 DIAGNOSIS — J4530 Mild persistent asthma, uncomplicated: Secondary | ICD-10-CM | POA: Diagnosis not present

## 2020-12-07 ENCOUNTER — Other Ambulatory Visit: Payer: Self-pay | Admitting: Orthopedic Surgery

## 2020-12-07 DIAGNOSIS — M47816 Spondylosis without myelopathy or radiculopathy, lumbar region: Secondary | ICD-10-CM

## 2020-12-17 DIAGNOSIS — Z881 Allergy status to other antibiotic agents status: Secondary | ICD-10-CM | POA: Diagnosis not present

## 2020-12-17 DIAGNOSIS — N390 Urinary tract infection, site not specified: Secondary | ICD-10-CM | POA: Diagnosis not present

## 2020-12-17 DIAGNOSIS — K219 Gastro-esophageal reflux disease without esophagitis: Secondary | ICD-10-CM | POA: Diagnosis not present

## 2020-12-17 DIAGNOSIS — R339 Retention of urine, unspecified: Secondary | ICD-10-CM | POA: Diagnosis not present

## 2020-12-17 DIAGNOSIS — E785 Hyperlipidemia, unspecified: Secondary | ICD-10-CM | POA: Diagnosis not present

## 2020-12-17 DIAGNOSIS — Z880 Allergy status to penicillin: Secondary | ICD-10-CM | POA: Diagnosis not present

## 2020-12-17 DIAGNOSIS — Z882 Allergy status to sulfonamides status: Secondary | ICD-10-CM | POA: Diagnosis not present

## 2020-12-17 DIAGNOSIS — Z888 Allergy status to other drugs, medicaments and biological substances status: Secondary | ICD-10-CM | POA: Diagnosis not present

## 2020-12-17 DIAGNOSIS — Z885 Allergy status to narcotic agent status: Secondary | ICD-10-CM | POA: Diagnosis not present

## 2020-12-17 DIAGNOSIS — Z7982 Long term (current) use of aspirin: Secondary | ICD-10-CM | POA: Diagnosis not present

## 2020-12-17 DIAGNOSIS — Z79899 Other long term (current) drug therapy: Secondary | ICD-10-CM | POA: Diagnosis not present

## 2020-12-17 DIAGNOSIS — N3941 Urge incontinence: Secondary | ICD-10-CM | POA: Diagnosis not present

## 2020-12-21 NOTE — Progress Notes (Signed)
Patient on schedule for Myelogram , called spoke with patient with pre procedure instructions given. Made aware to be here at 0830, liquids after MN, and NPO after 0430 , and driver procedure/recovery. Stated understanding.

## 2020-12-22 DIAGNOSIS — E559 Vitamin D deficiency, unspecified: Secondary | ICD-10-CM | POA: Diagnosis not present

## 2020-12-22 DIAGNOSIS — M0579 Rheumatoid arthritis with rheumatoid factor of multiple sites without organ or systems involvement: Secondary | ICD-10-CM | POA: Diagnosis not present

## 2020-12-22 DIAGNOSIS — M159 Polyosteoarthritis, unspecified: Secondary | ICD-10-CM | POA: Diagnosis not present

## 2020-12-22 DIAGNOSIS — M810 Age-related osteoporosis without current pathological fracture: Secondary | ICD-10-CM | POA: Diagnosis not present

## 2020-12-24 ENCOUNTER — Ambulatory Visit
Admission: RE | Admit: 2020-12-24 | Discharge: 2020-12-24 | Disposition: A | Discharge status: Home / Self Care | Payer: PPO | Source: Ambulatory Visit | Attending: Orthopedic Surgery | Admitting: Orthopedic Surgery

## 2020-12-24 DIAGNOSIS — M47816 Spondylosis without myelopathy or radiculopathy, lumbar region: Secondary | ICD-10-CM

## 2020-12-24 DIAGNOSIS — M5136 Other intervertebral disc degeneration, lumbar region: Secondary | ICD-10-CM | POA: Diagnosis not present

## 2020-12-24 MED ORDER — LIDOCAINE HCL (PF) 1 % IJ SOLN
5.0000 mL | Freq: Once | INTRAMUSCULAR | Status: AC
  Administered 2020-12-24: 5 mL
  Filled 2020-12-24: qty 5

## 2020-12-24 MED ORDER — IOHEXOL 180 MG/ML  SOLN
20.0000 mL | Freq: Once | INTRAMUSCULAR | Status: AC | PRN
  Administered 2020-12-24: 20 mL

## 2020-12-31 DIAGNOSIS — Z6841 Body Mass Index (BMI) 40.0 and over, adult: Secondary | ICD-10-CM | POA: Diagnosis not present

## 2020-12-31 DIAGNOSIS — G5793 Unspecified mononeuropathy of bilateral lower limbs: Secondary | ICD-10-CM | POA: Diagnosis not present

## 2020-12-31 DIAGNOSIS — M159 Polyosteoarthritis, unspecified: Secondary | ICD-10-CM | POA: Diagnosis not present

## 2020-12-31 DIAGNOSIS — M8000XD Age-related osteoporosis with current pathological fracture, unspecified site, subsequent encounter for fracture with routine healing: Secondary | ICD-10-CM | POA: Diagnosis not present

## 2020-12-31 DIAGNOSIS — M0579 Rheumatoid arthritis with rheumatoid factor of multiple sites without organ or systems involvement: Secondary | ICD-10-CM | POA: Diagnosis not present

## 2020-12-31 DIAGNOSIS — R262 Difficulty in walking, not elsewhere classified: Secondary | ICD-10-CM | POA: Diagnosis not present

## 2021-01-07 DIAGNOSIS — I890 Lymphedema, not elsewhere classified: Secondary | ICD-10-CM | POA: Diagnosis not present

## 2021-01-07 DIAGNOSIS — R200 Anesthesia of skin: Secondary | ICD-10-CM | POA: Diagnosis not present

## 2021-01-07 DIAGNOSIS — M7061 Trochanteric bursitis, right hip: Secondary | ICD-10-CM | POA: Diagnosis not present

## 2021-01-07 DIAGNOSIS — M545 Low back pain, unspecified: Secondary | ICD-10-CM | POA: Diagnosis not present

## 2021-01-07 DIAGNOSIS — Z6841 Body Mass Index (BMI) 40.0 and over, adult: Secondary | ICD-10-CM | POA: Diagnosis not present

## 2021-01-07 DIAGNOSIS — R262 Difficulty in walking, not elsewhere classified: Secondary | ICD-10-CM | POA: Diagnosis not present

## 2021-01-07 DIAGNOSIS — K5900 Constipation, unspecified: Secondary | ICD-10-CM | POA: Diagnosis not present

## 2021-01-07 DIAGNOSIS — R7309 Other abnormal glucose: Secondary | ICD-10-CM | POA: Diagnosis not present

## 2021-01-07 DIAGNOSIS — N3010 Interstitial cystitis (chronic) without hematuria: Secondary | ICD-10-CM | POA: Diagnosis not present

## 2021-01-07 DIAGNOSIS — Z9889 Other specified postprocedural states: Secondary | ICD-10-CM | POA: Diagnosis not present

## 2021-01-07 DIAGNOSIS — S22080D Wedge compression fracture of T11-T12 vertebra, subsequent encounter for fracture with routine healing: Secondary | ICD-10-CM | POA: Diagnosis not present

## 2021-01-07 DIAGNOSIS — Z23 Encounter for immunization: Secondary | ICD-10-CM | POA: Diagnosis not present

## 2021-01-14 DIAGNOSIS — M4714 Other spondylosis with myelopathy, thoracic region: Secondary | ICD-10-CM | POA: Diagnosis not present

## 2021-01-14 DIAGNOSIS — M4014 Other secondary kyphosis, thoracic region: Secondary | ICD-10-CM | POA: Diagnosis not present

## 2021-01-14 DIAGNOSIS — S22080K Wedge compression fracture of T11-T12 vertebra, subsequent encounter for fracture with nonunion: Secondary | ICD-10-CM | POA: Diagnosis not present

## 2021-01-14 DIAGNOSIS — R29898 Other symptoms and signs involving the musculoskeletal system: Secondary | ICD-10-CM | POA: Diagnosis not present

## 2021-01-17 DIAGNOSIS — J441 Chronic obstructive pulmonary disease with (acute) exacerbation: Secondary | ICD-10-CM | POA: Diagnosis not present

## 2021-01-17 DIAGNOSIS — R059 Cough, unspecified: Secondary | ICD-10-CM | POA: Diagnosis not present

## 2021-01-17 DIAGNOSIS — R0981 Nasal congestion: Secondary | ICD-10-CM | POA: Diagnosis not present

## 2021-01-24 IMAGING — XA DG C-ARM 1-60 MIN
1 series · 1 of 1 positions shown · non-contrast
Comparison: June 26, 2019.

CLINICAL DATA: T12 kyphoplasty.

EXAM:
THORACIC SPINE 2 VIEWS; DG C-ARM 1-60 MIN
Radiation exposure index: 6.8 mGy.

[Series 2: ortho standard · 1 of 1 slices shown]
[im 1/1]
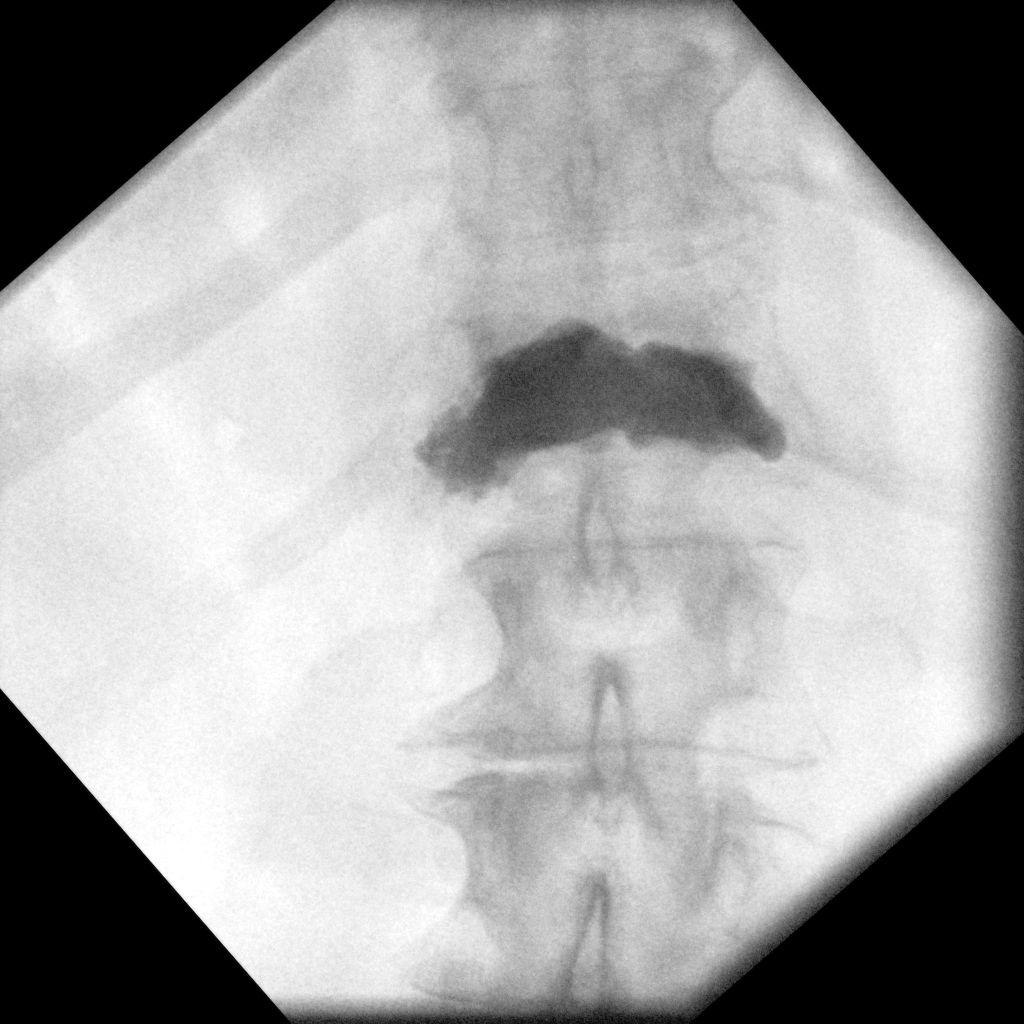

[1 of 1 positions shown; findings below may reference images not displayed]

FINDINGS: Two intraoperative fluoroscopic images were obtained during T12
kyphoplasty.
IMPRESSION: Fluoroscopic guidance provided during T12 kyphoplasty.

## 2021-01-31 DIAGNOSIS — Z6841 Body Mass Index (BMI) 40.0 and over, adult: Secondary | ICD-10-CM | POA: Diagnosis not present

## 2021-01-31 DIAGNOSIS — R262 Difficulty in walking, not elsewhere classified: Secondary | ICD-10-CM | POA: Diagnosis not present

## 2021-01-31 DIAGNOSIS — G5793 Unspecified mononeuropathy of bilateral lower limbs: Secondary | ICD-10-CM | POA: Diagnosis not present

## 2021-02-10 DIAGNOSIS — R350 Frequency of micturition: Secondary | ICD-10-CM | POA: Diagnosis not present

## 2021-02-10 DIAGNOSIS — R300 Dysuria: Secondary | ICD-10-CM | POA: Diagnosis not present

## 2021-02-17 DIAGNOSIS — M810 Age-related osteoporosis without current pathological fracture: Secondary | ICD-10-CM | POA: Diagnosis not present

## 2021-03-04 DIAGNOSIS — M810 Age-related osteoporosis without current pathological fracture: Secondary | ICD-10-CM | POA: Diagnosis not present

## 2021-03-19 DIAGNOSIS — Z6841 Body Mass Index (BMI) 40.0 and over, adult: Secondary | ICD-10-CM | POA: Diagnosis not present

## 2021-03-19 DIAGNOSIS — R600 Localized edema: Secondary | ICD-10-CM | POA: Diagnosis not present

## 2021-03-22 DIAGNOSIS — S22080S Wedge compression fracture of T11-T12 vertebra, sequela: Secondary | ICD-10-CM | POA: Diagnosis not present

## 2021-03-22 DIAGNOSIS — R350 Frequency of micturition: Secondary | ICD-10-CM | POA: Diagnosis not present

## 2021-03-22 DIAGNOSIS — R600 Localized edema: Secondary | ICD-10-CM | POA: Diagnosis not present

## 2021-03-22 DIAGNOSIS — J449 Chronic obstructive pulmonary disease, unspecified: Secondary | ICD-10-CM | POA: Diagnosis not present

## 2021-03-22 DIAGNOSIS — N3010 Interstitial cystitis (chronic) without hematuria: Secondary | ICD-10-CM | POA: Diagnosis not present

## 2021-04-03 IMAGING — CT CT L SPINE W/ CM
3 of 4 series · 11 of 33 positions shown, 13 images · IV contrast (isovue)
Comparison: Myelogram images same day.  CT 07/15/2019.

CLINICAL DATA: Chronic back pain.

EXAM:
CT MYELOGRAPHY LUMBAR SPINE
TECHNIQUE: CT imaging of the lumbar spine was performed after Isovue 200M
contrast administration. Multiplanar CT image reconstructions were
also generated.

[Series 7: sagittal bone · sagittal · 0.29mm/px · 5 of 70 slices shown, 6 images]
[im 24/70  bone]
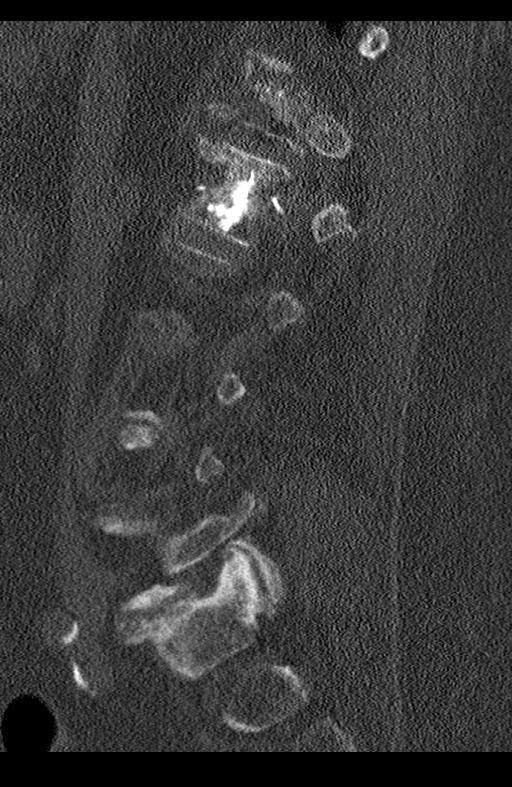
[im 29/70  bone]
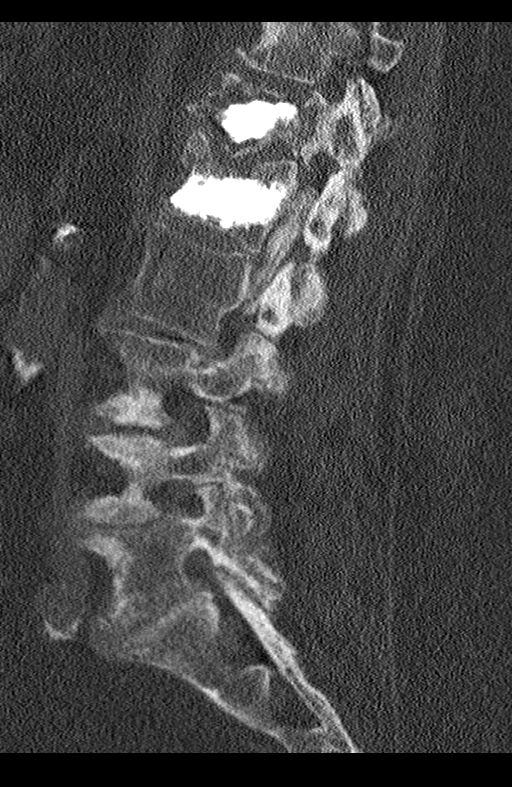
[im 35/70  soft-tissue]
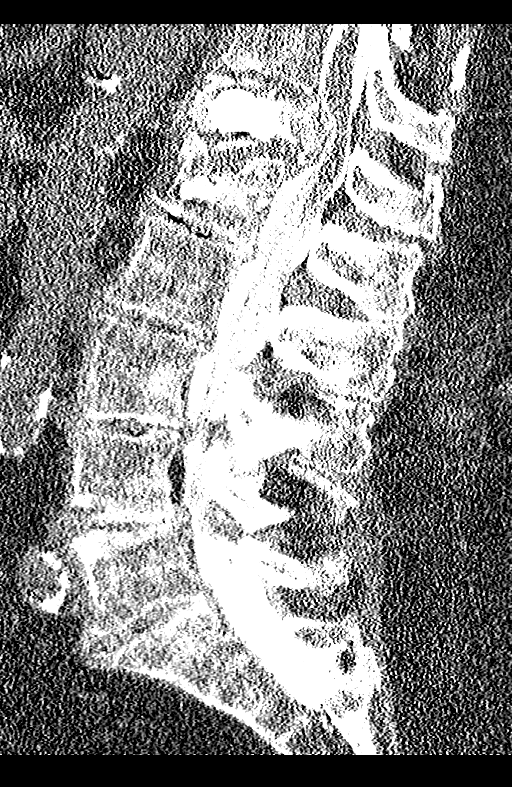
[im 35/70  bone]
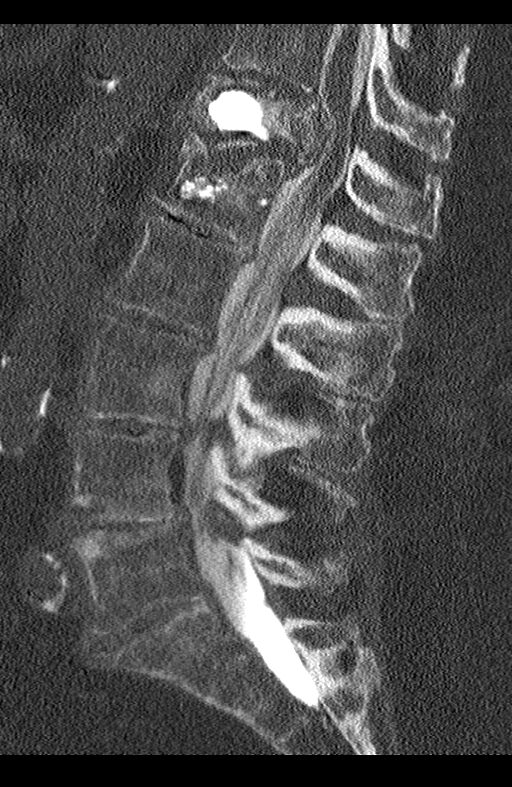
[im 41/70  bone]
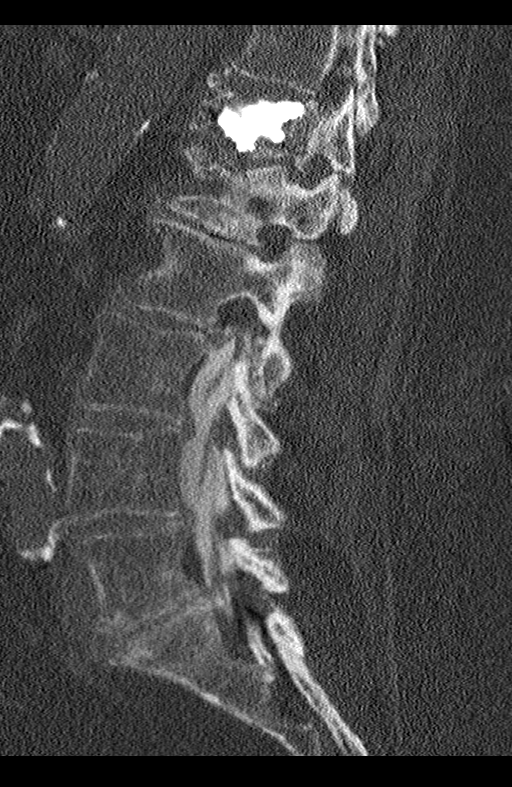
[im 47/70  bone]
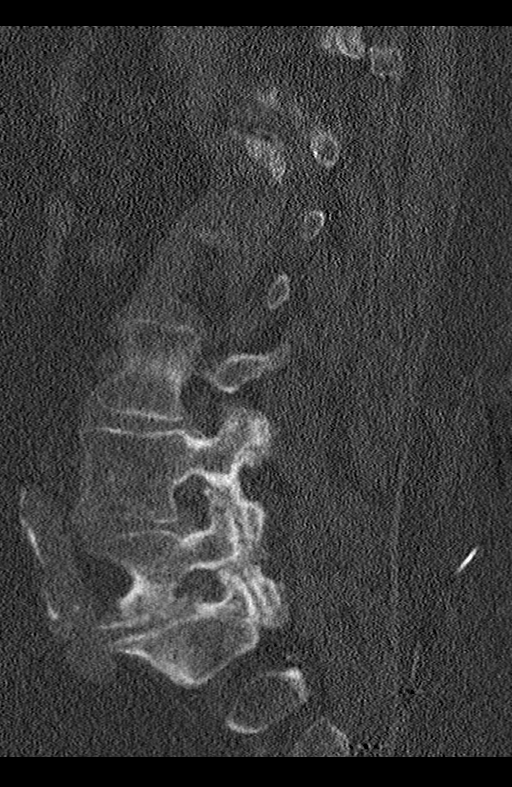

[Series 8: coronal bone · coronal · 0.30mm/px · 3 of 68 slices shown]
[im 14/68  bone]
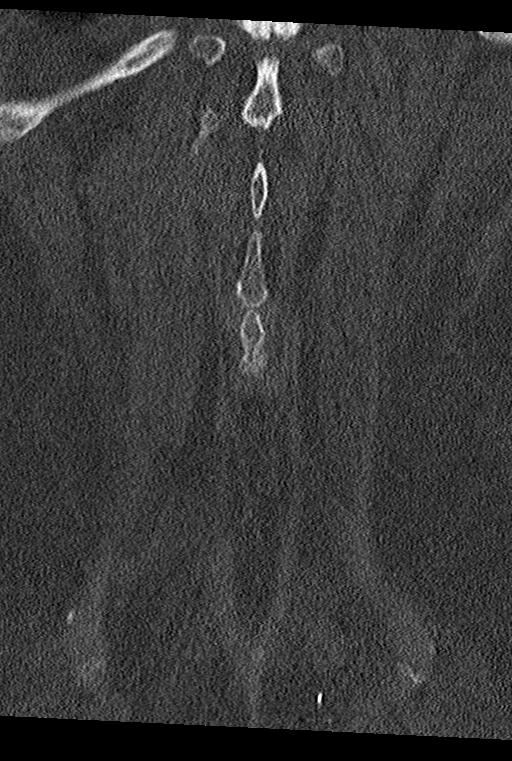
[im 27/68  bone]
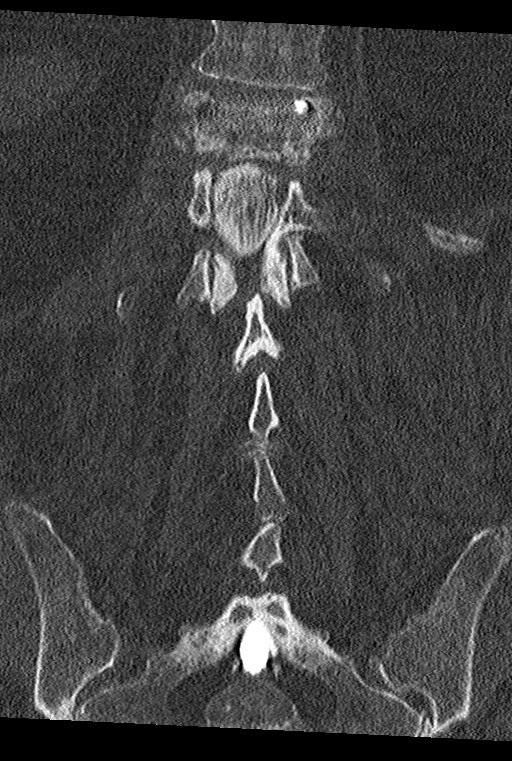
[im 41/68  bone]
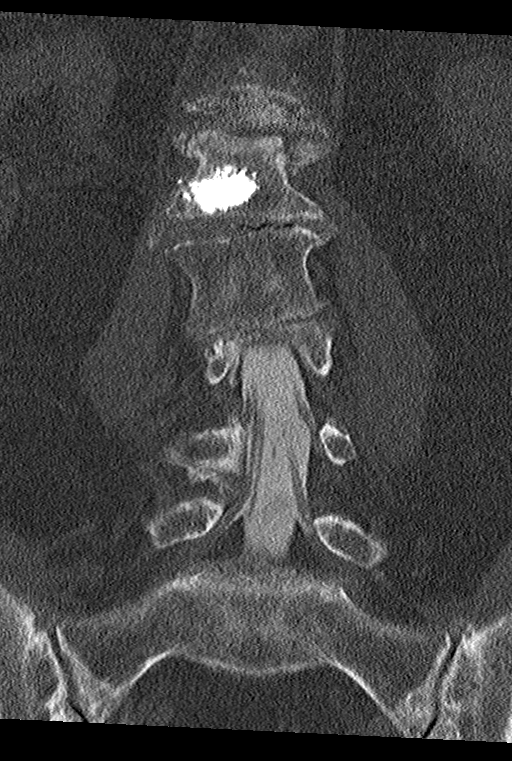

[Series 9: multi disc · axial · 0.27mm/px · z∈[-308,-139]mm · 3 of 111 slices shown, 4 images]
[im 19/111  soft-tissue]
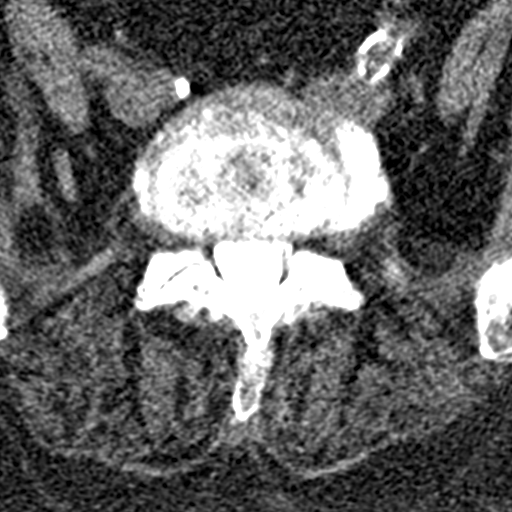
[im 19/111  bone]
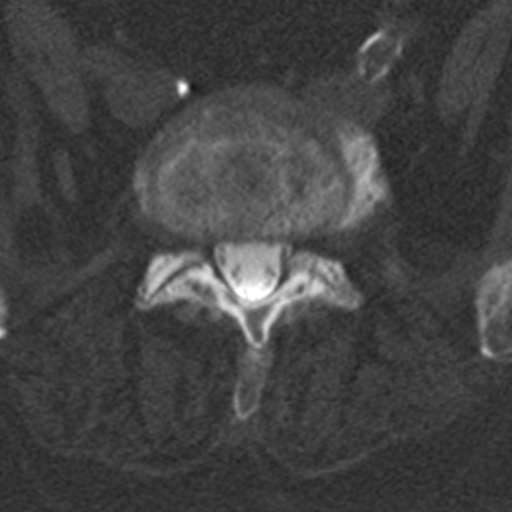
[im 56/111  bone]
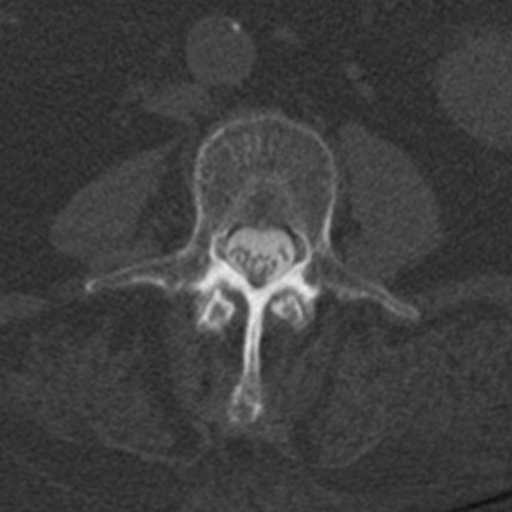
[im 92/111  bone]
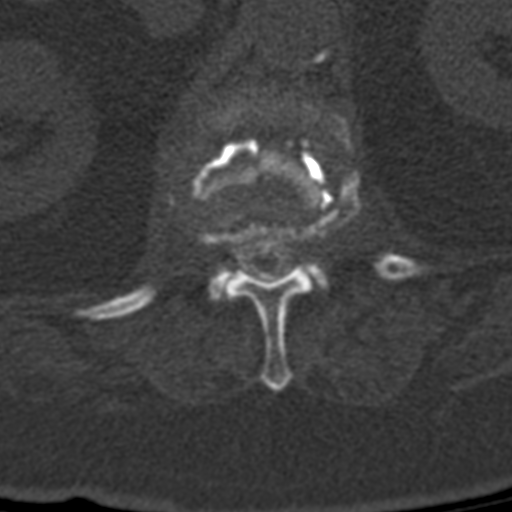

[11 of 33 positions shown; findings below may reference images not displayed]

FINDINGS: CT scanning was performed following myelography.

Segmentation:  5 lumbar type vertebral bodies.

Alignment: Thoracolumbar curvature convex to the right and lower
lumbar curvature convex to the left. 2 mm degenerative
anterolisthesis L4-5.

Vertebrae: Previously augmented fracture at T12 with posterior
bowing of the posterior margin of the vertebral body, encroaching
upon the spinal canal as seen previously. Particularly when looking
at the coronal images, there appears to have been slight further
collapse of the vertebral body around the methylmethacrylate.
Recently augmented fracture at L1 on the right side to central
portion. Slight further collapse of this fracture compared to the
pre augmentation CT. Some extension of methylmethacrylate into right
para spinous soft tissues.

Conus medullaris: Extends to the L1 level and appears normal.

Paraspinal and other soft tissues: Aortic atherosclerosis. Otherwise
negative.

Disc levels:

From T11-L1, as noted above, there is posterior bowing/retropulsion
of the T12 vertebral body measuring 7 mm, encroaching upon the
spinal canal. This effaces the subarachnoid space and does deform
the distal cord slightly.

At L1, vertebral augmentation has been performed with
methylmethacrylate in the right and central portion of the vertebral
body. There has been some further collapse of the L1 vertebral body
since the study of 07/15/2019.

L1-2: Bulging of the disc.  No compressive stenosis.

L2-3: Bulging of the disc. Mild narrowing of the right lateral
recess without definite neural compression.

L3-4: Bulging of the disc. Mild narrowing of both lateral recesses
without definite neural compression.

L4-5: Facet arthropathy with 2 mm of anterolisthesis. Bulging of the
disc. Mild stenosis of the lateral recesses without apparent neural
compression.

L5-S1: Chronic disc degeneration.  No stenosis.
IMPRESSION: At the previously augmented level of T12, it appears that there has
been slight further vertebral collapse around the
methylmethacrylate. There is retropulsion of 7 mm, encroaching upon
the spinal canal with some deformity of the distal cord.

At the recently augmented level of L1, there has been slight further
collapse of the vertebral body but no retropulsion. Small amount of
right para spinous soft tissue cement.

Ordinary mild chronic degenerative changes below that without
appreciable change since [DATE].

## 2021-04-15 DIAGNOSIS — J439 Emphysema, unspecified: Secondary | ICD-10-CM | POA: Diagnosis not present

## 2021-04-15 DIAGNOSIS — Z01818 Encounter for other preprocedural examination: Secondary | ICD-10-CM | POA: Diagnosis not present

## 2021-04-15 DIAGNOSIS — J310 Chronic rhinitis: Secondary | ICD-10-CM | POA: Diagnosis not present

## 2021-04-15 DIAGNOSIS — R0609 Other forms of dyspnea: Secondary | ICD-10-CM | POA: Diagnosis not present

## 2021-04-16 ENCOUNTER — Other Ambulatory Visit: Payer: Self-pay | Admitting: Internal Medicine

## 2021-04-16 DIAGNOSIS — N6489 Other specified disorders of breast: Secondary | ICD-10-CM

## 2021-04-19 DIAGNOSIS — M40204 Unspecified kyphosis, thoracic region: Secondary | ICD-10-CM | POA: Diagnosis not present

## 2021-04-19 DIAGNOSIS — M47814 Spondylosis without myelopathy or radiculopathy, thoracic region: Secondary | ICD-10-CM | POA: Diagnosis not present

## 2021-04-19 DIAGNOSIS — M47812 Spondylosis without myelopathy or radiculopathy, cervical region: Secondary | ICD-10-CM | POA: Diagnosis not present

## 2021-04-19 DIAGNOSIS — M47816 Spondylosis without myelopathy or radiculopathy, lumbar region: Secondary | ICD-10-CM | POA: Diagnosis not present

## 2021-04-19 DIAGNOSIS — M546 Pain in thoracic spine: Secondary | ICD-10-CM | POA: Diagnosis not present

## 2021-04-19 DIAGNOSIS — Z9049 Acquired absence of other specified parts of digestive tract: Secondary | ICD-10-CM | POA: Diagnosis not present

## 2021-04-19 DIAGNOSIS — M4854XA Collapsed vertebra, not elsewhere classified, thoracic region, initial encounter for fracture: Secondary | ICD-10-CM | POA: Diagnosis not present

## 2021-04-19 DIAGNOSIS — Z6841 Body Mass Index (BMI) 40.0 and over, adult: Secondary | ICD-10-CM | POA: Diagnosis not present

## 2021-04-19 DIAGNOSIS — R262 Difficulty in walking, not elsewhere classified: Secondary | ICD-10-CM | POA: Diagnosis not present

## 2021-04-19 DIAGNOSIS — R531 Weakness: Secondary | ICD-10-CM | POA: Diagnosis not present

## 2021-04-19 DIAGNOSIS — S22080A Wedge compression fracture of T11-T12 vertebra, initial encounter for closed fracture: Secondary | ICD-10-CM | POA: Diagnosis not present

## 2021-04-19 DIAGNOSIS — G8929 Other chronic pain: Secondary | ICD-10-CM | POA: Diagnosis not present

## 2021-04-29 DIAGNOSIS — R7309 Other abnormal glucose: Secondary | ICD-10-CM | POA: Diagnosis not present

## 2021-04-29 DIAGNOSIS — I890 Lymphedema, not elsewhere classified: Secondary | ICD-10-CM | POA: Diagnosis not present

## 2021-04-29 DIAGNOSIS — S22080S Wedge compression fracture of T11-T12 vertebra, sequela: Secondary | ICD-10-CM | POA: Diagnosis not present

## 2021-04-29 DIAGNOSIS — R262 Difficulty in walking, not elsewhere classified: Secondary | ICD-10-CM | POA: Diagnosis not present

## 2021-04-29 DIAGNOSIS — Z9889 Other specified postprocedural states: Secondary | ICD-10-CM | POA: Diagnosis not present

## 2021-04-29 DIAGNOSIS — M7061 Trochanteric bursitis, right hip: Secondary | ICD-10-CM | POA: Diagnosis not present

## 2021-04-29 DIAGNOSIS — M545 Low back pain, unspecified: Secondary | ICD-10-CM | POA: Diagnosis not present

## 2021-04-29 DIAGNOSIS — E785 Hyperlipidemia, unspecified: Secondary | ICD-10-CM | POA: Diagnosis not present

## 2021-04-29 DIAGNOSIS — R200 Anesthesia of skin: Secondary | ICD-10-CM | POA: Diagnosis not present

## 2021-04-29 DIAGNOSIS — Z6841 Body Mass Index (BMI) 40.0 and over, adult: Secondary | ICD-10-CM | POA: Diagnosis not present

## 2021-04-29 DIAGNOSIS — K5900 Constipation, unspecified: Secondary | ICD-10-CM | POA: Diagnosis not present

## 2021-04-29 DIAGNOSIS — N3010 Interstitial cystitis (chronic) without hematuria: Secondary | ICD-10-CM | POA: Diagnosis not present

## 2021-05-07 DIAGNOSIS — J4520 Mild intermittent asthma, uncomplicated: Secondary | ICD-10-CM | POA: Diagnosis not present

## 2021-05-07 DIAGNOSIS — Z6841 Body Mass Index (BMI) 40.0 and over, adult: Secondary | ICD-10-CM | POA: Diagnosis not present

## 2021-05-07 DIAGNOSIS — R262 Difficulty in walking, not elsewhere classified: Secondary | ICD-10-CM | POA: Diagnosis not present

## 2021-05-07 DIAGNOSIS — M159 Polyosteoarthritis, unspecified: Secondary | ICD-10-CM | POA: Diagnosis not present

## 2021-05-07 DIAGNOSIS — F3340 Major depressive disorder, recurrent, in remission, unspecified: Secondary | ICD-10-CM | POA: Diagnosis not present

## 2021-05-07 DIAGNOSIS — Z0000 Encounter for general adult medical examination without abnormal findings: Secondary | ICD-10-CM | POA: Diagnosis not present

## 2021-05-07 DIAGNOSIS — I890 Lymphedema, not elsewhere classified: Secondary | ICD-10-CM | POA: Diagnosis not present

## 2021-05-07 DIAGNOSIS — M0579 Rheumatoid arthritis with rheumatoid factor of multiple sites without organ or systems involvement: Secondary | ICD-10-CM | POA: Diagnosis not present

## 2021-05-07 DIAGNOSIS — M48061 Spinal stenosis, lumbar region without neurogenic claudication: Secondary | ICD-10-CM | POA: Diagnosis not present

## 2021-05-07 DIAGNOSIS — R7309 Other abnormal glucose: Secondary | ICD-10-CM | POA: Diagnosis not present

## 2021-05-18 DIAGNOSIS — S22080D Wedge compression fracture of T11-T12 vertebra, subsequent encounter for fracture with routine healing: Secondary | ICD-10-CM | POA: Diagnosis not present

## 2021-05-18 DIAGNOSIS — I890 Lymphedema, not elsewhere classified: Secondary | ICD-10-CM | POA: Diagnosis not present

## 2021-05-18 DIAGNOSIS — I872 Venous insufficiency (chronic) (peripheral): Secondary | ICD-10-CM | POA: Diagnosis not present

## 2021-05-18 DIAGNOSIS — R262 Difficulty in walking, not elsewhere classified: Secondary | ICD-10-CM | POA: Diagnosis not present

## 2021-05-18 DIAGNOSIS — J4520 Mild intermittent asthma, uncomplicated: Secondary | ICD-10-CM | POA: Diagnosis not present

## 2021-05-18 DIAGNOSIS — N3010 Interstitial cystitis (chronic) without hematuria: Secondary | ICD-10-CM | POA: Diagnosis not present

## 2021-05-18 DIAGNOSIS — S32010D Wedge compression fracture of first lumbar vertebra, subsequent encounter for fracture with routine healing: Secondary | ICD-10-CM | POA: Diagnosis not present

## 2021-05-18 DIAGNOSIS — F32A Depression, unspecified: Secondary | ICD-10-CM | POA: Diagnosis not present

## 2021-05-25 ENCOUNTER — Ambulatory Visit
Admission: RE | Admit: 2021-05-25 | Discharge: 2021-05-25 | Disposition: A | Discharge status: Home / Self Care | Payer: PPO | Source: Ambulatory Visit | Attending: Internal Medicine | Admitting: Internal Medicine

## 2021-05-25 ENCOUNTER — Other Ambulatory Visit: Payer: Self-pay

## 2021-05-25 DIAGNOSIS — R922 Inconclusive mammogram: Secondary | ICD-10-CM | POA: Diagnosis not present

## 2021-05-25 DIAGNOSIS — N6489 Other specified disorders of breast: Secondary | ICD-10-CM | POA: Diagnosis not present

## 2021-05-27 DIAGNOSIS — L218 Other seborrheic dermatitis: Secondary | ICD-10-CM | POA: Diagnosis not present

## 2021-05-27 DIAGNOSIS — L570 Actinic keratosis: Secondary | ICD-10-CM | POA: Diagnosis not present

## 2021-05-27 DIAGNOSIS — R7309 Other abnormal glucose: Secondary | ICD-10-CM | POA: Diagnosis not present

## 2021-05-27 DIAGNOSIS — L821 Other seborrheic keratosis: Secondary | ICD-10-CM | POA: Diagnosis not present

## 2021-05-27 DIAGNOSIS — J4520 Mild intermittent asthma, uncomplicated: Secondary | ICD-10-CM | POA: Diagnosis not present

## 2021-05-27 DIAGNOSIS — Z6841 Body Mass Index (BMI) 40.0 and over, adult: Secondary | ICD-10-CM | POA: Diagnosis not present

## 2021-05-27 DIAGNOSIS — M48061 Spinal stenosis, lumbar region without neurogenic claudication: Secondary | ICD-10-CM | POA: Diagnosis not present

## 2021-05-27 DIAGNOSIS — R262 Difficulty in walking, not elsewhere classified: Secondary | ICD-10-CM | POA: Diagnosis not present

## 2021-05-27 DIAGNOSIS — M0579 Rheumatoid arthritis with rheumatoid factor of multiple sites without organ or systems involvement: Secondary | ICD-10-CM | POA: Diagnosis not present

## 2021-05-27 DIAGNOSIS — F3340 Major depressive disorder, recurrent, in remission, unspecified: Secondary | ICD-10-CM | POA: Diagnosis not present

## 2021-06-01 DIAGNOSIS — H35372 Puckering of macula, left eye: Secondary | ICD-10-CM | POA: Diagnosis not present

## 2021-06-01 DIAGNOSIS — H35389 Toxic maculopathy, unspecified eye: Secondary | ICD-10-CM | POA: Diagnosis not present

## 2021-06-01 DIAGNOSIS — H1045 Other chronic allergic conjunctivitis: Secondary | ICD-10-CM | POA: Diagnosis not present

## 2021-06-01 DIAGNOSIS — T372X5A Adverse effect of antimalarials and drugs acting on other blood protozoa, initial encounter: Secondary | ICD-10-CM | POA: Diagnosis not present

## 2021-06-03 DIAGNOSIS — W010XXA Fall on same level from slipping, tripping and stumbling without subsequent striking against object, initial encounter: Secondary | ICD-10-CM | POA: Diagnosis not present

## 2021-06-03 DIAGNOSIS — R350 Frequency of micturition: Secondary | ICD-10-CM | POA: Diagnosis not present

## 2021-06-03 DIAGNOSIS — S29012A Strain of muscle and tendon of back wall of thorax, initial encounter: Secondary | ICD-10-CM | POA: Diagnosis not present

## 2021-06-03 DIAGNOSIS — G8929 Other chronic pain: Secondary | ICD-10-CM | POA: Diagnosis not present

## 2021-06-03 DIAGNOSIS — M545 Low back pain, unspecified: Secondary | ICD-10-CM | POA: Diagnosis not present

## 2021-06-03 DIAGNOSIS — Y92009 Unspecified place in unspecified non-institutional (private) residence as the place of occurrence of the external cause: Secondary | ICD-10-CM | POA: Diagnosis not present

## 2021-06-06 ENCOUNTER — Ambulatory Visit
Admission: EM | Admit: 2021-06-06 | Discharge: 2021-06-06 | Disposition: A | Discharge status: Home / Self Care | Payer: PPO | Attending: Physician Assistant | Admitting: Physician Assistant

## 2021-06-06 ENCOUNTER — Other Ambulatory Visit: Payer: Self-pay

## 2021-06-06 ENCOUNTER — Encounter: Payer: Self-pay | Admitting: Emergency Medicine

## 2021-06-06 DIAGNOSIS — N3000 Acute cystitis without hematuria: Secondary | ICD-10-CM | POA: Diagnosis not present

## 2021-06-06 LAB — URINALYSIS, ROUTINE W REFLEX MICROSCOPIC
Bilirubin Urine: NEGATIVE
Glucose, UA: 100 mg/dL — AB
Hgb urine dipstick: NEGATIVE
Ketones, ur: NEGATIVE mg/dL
Leukocytes,Ua: NEGATIVE
Nitrite: POSITIVE — AB
Specific Gravity, Urine: 1.02 (ref 1.005–1.030)
pH: 7 (ref 5.0–8.0)

## 2021-06-06 LAB — URINALYSIS, MICROSCOPIC (REFLEX)

## 2021-06-06 MED ORDER — CEPHALEXIN 500 MG PO CAPS: 500.0000 mg | ORAL_CAPSULE | Freq: Two times a day (BID) | ORAL | 0 refills | Status: AC

## 2021-06-06 NOTE — ED Provider Notes (Signed)
?[facility] ? ? ? ?CSN: [id number] ?Arrival date & time: 06/06/21  1539 ? ? ?  ? ?History   ?Chief Complaint ?Chief Complaint  ?Patient presents with  ? Urinary Frequency  ? Dysuria  ? ? ?HPI ?Amber Kirk is a 87 y.o. female.  ? ?Patient presents with urinary urgency, urinary frequency and dysuria beginning this morning.  Has attempted use of Azo which has been effective.  Denies hematuria, lower abdominal pain or pressure, flank pain, vaginal symptoms, fever or chills.   ? ? ? ?Past Medical History:  ?Diagnosis Date  ? Allergy   ? Asthma   ? Bowel obstruction ([place]) 06/2019  ? Complication of anesthesia   ? COPD (chronic obstructive pulmonary disease) ([place])   ? GERD (gastroesophageal reflux disease)   ? Heart murmur   ? Interstitial cystitis   ? PONV (postoperative nausea and vomiting)   ? after 1st cataract surgery  ? Sleep apnea   ? mild-no cpap   ? ? ?Patient Active Problem List  ? Diagnosis Date Noted  ? Ambulatory dysfunction 10/29/2019  ? Lumbar stenosis without neurogenic claudication 09/16/2019  ? Chronic bilateral low back pain with bilateral sciatica 09/02/2019  ? H/O kyphoplasty 07/26/2019  ? Ileus ([place])   ? Constipation   ? Compression fracture of T12 vertebra (HCC)   ? Colitis 06/27/2019  ? Generalized osteoarthritis of multiple sites 10/24/2018  ? Gastroesophageal reflux disease 10/24/2018  ? Major depressive disorder, recurrent, in remission ([place]) 10/24/2018  ? Pain in joint of left shoulder 07/13/2018  ? Primary localized osteoarthrosis of ankle and foot 06/13/2018  ? Morbid obesity with BMI of 40.0-44.9, adult ([place]) 03/29/2018  ? Chronic venous insufficiency 07/04/2016  ? Lymphedema 07/04/2016  ? Asthma 07/04/2016  ? Hyperlipidemia 07/04/2016  ? Mechanical breakdown of implanted electronic neurostimulator of peripheral nerve ([place]) 04/27/2016  ? Dysuria 02/10/2016  ? Trochanteric bursitis of both hips 04/13/2015  ? Tendinitis of left rotator cuff 04/01/2015  ? Hyperuricuria  06/05/2014  ? Pyuria 06/05/2014  ? Urinary tract infection 06/05/2014  ? Anxiety disorder 05/28/2014  ? Rheumatoid arthritis ([place]) 12/16/2013  ? Unspecified osteoarthritis, unspecified site 12/16/2013  ? Cough 09/11/2013  ? Chronic interstitial cystitis 12/21/2012  ? Female stress incontinence 12/21/2012  ? Incomplete emptying of bladder 12/21/2012  ? Increased frequency of urination 12/21/2012  ? Nocturia 12/21/2012  ? Other symptoms and signs involving the genitourinary system 12/21/2012  ? ? ?Past Surgical History:  ?Procedure Laterality Date  ? ABDOMINAL HYSTERECTOMY    ? APPENDECTOMY    ? BLADDER SURGERY    ? BREAST BIOPSY Right 04/24/2020  ? stereo bx, x clip,  - BENIGN BREAST TISSUE WITH PATCHY DENSE STROMAL FIBROSIS  ? CHOLECYSTECTOMY    ? EYE SURGERY    ? bil cataracts  ? KYPHOPLASTY N/A 07/05/2019  ? Procedure: T12 KYPHOPLASTY;  Surgeon: Hessie Knows, MD;  Location: [facility] ORS;  Service: Orthopedics;  Laterality: N/A;  ? KYPHOPLASTY N/A 07/23/2019  ? Procedure: L1 KYPHOPLASTY;  Surgeon: Hessie Knows, MD;  Location: [facility] ORS;  Service: Orthopedics;  Laterality: N/A;  ? TONSILLECTOMY    ? ? ?OB History   ?No obstetric history on file. ?  ? ? ? ?Home Medications   ? ?Prior to Admission medications   ?Medication Sig Start Date End Date Taking? Authorizing Provider  ?aspirin EC 81 MG tablet Take 81 mg by mouth daily.  04/25/12  Yes [provider]  ?Calcium Carbonate (CALCIUM 500 PO) Take 500 mg  by mouth daily.   Yes [provider]  ?cetirizine (ZYRTEC) 10 MG tablet Take 10 mg by mouth every morning.    Yes [provider]  ?cholecalciferol (VITAMIN D) 25 MCG (1000 UNIT) tablet Take 1,000 Units by mouth daily.   Yes [provider]  ?doxepin (SINEQUAN) 10 MG capsule Take 10 mg by mouth at bedtime. 07/20/20  Yes [provider]  ?estradiol (ESTRACE) 0.1 MG/GM vaginal cream Place 1 Applicatorful vaginally 3 (three) times a week.   Yes [provider]   ?gabapentin (NEURONTIN) 100 MG capsule Take 100 mg by mouth 2 (two) times daily. 1 am and 2 tabs at evening 06/09/20  Yes [provider]  ?hydroxychloroquine (PLAQUENIL) 200 MG tablet Take 100 mg by mouth daily.    Yes [provider]  ?lovastatin (MEVACOR) 20 MG tablet Take 20 mg by mouth daily with supper.    Yes [provider]  ?montelukast (SINGULAIR) 10 MG tablet Take 10 mg by mouth every evening.    Yes [provider]  ?olopatadine (PATANOL) 0.1 % ophthalmic solution Place 1 drop into both eyes 2 (two) times daily as needed for allergies.    Yes [provider]  ?omeprazole (PRILOSEC) 20 MG capsule Take 20 mg by mouth in the morning and at bedtime.    Yes [provider]  ?SYMBICORT 160-4.5 MCG/ACT inhaler Inhale 2 puffs into the lungs in the morning and at bedtime.    Yes [provider]  ?trospium (SANCTURA) 20 MG tablet Take 20 mg by mouth at bedtime.   Yes [provider]  ?albuterol (VENTOLIN HFA) 108 (90 Base) MCG/ACT inhaler Inhale 1-2 puffs into the lungs every 6 (six) hours as needed for wheezing or shortness of breath.    [provider]  ?azelastine (ASTELIN) 0.1 % nasal spray Place 1 spray into both nostrils 2 (two) times daily. Use in each nostril as directed    [provider]  ?benzonatate (TESSALON) 100 MG capsule Take 2 capsules (200 mg total) by mouth 3 (three) times daily as needed. 10/22/20   Margarette Canada, NP  ?docusate sodium (COLACE) 100 MG capsule Take 1 capsule (100 mg total) by mouth 2 (two) times daily. 06/29/19   Fritzi Mandes, MD  ?fluticasone Asencion Islam) 50 MCG/ACT nasal spray Place 1 spray into both nostrils in the morning and at bedtime.     [provider]  ?ipratropium (ATROVENT) 0.06 % nasal spray Place 2 sprays into both nostrils 4 (four) times daily. 10/22/20   Margarette Canada, NP  ?lidocaine (LIDODERM) 5 % Place 1 patch onto the skin daily. Remove & Discard patch within 12 hours or as  directed by MD    [provider]  ?LINZESS 145 MCG CAPS capsule Take 145 mcg by mouth daily as needed (constipation.).  06/25/19   [provider]  ?ondansetron (ZOFRAN-ODT) 4 MG disintegrating tablet Take 4 mg by mouth every 8 (eight) hours as needed for nausea/vomiting. 07/01/19   [provider]  ?polyethylene glycol (MIRALAX / GLYCOLAX) 17 g packet Take 17 g by mouth 2 (two) times daily. 06/29/19   Fritzi Mandes, MD  ? ? ?Family History ?Family History  ?Problem Relation Age of Onset  ? Heart disease Mother   ? Stroke Father   ? Breast cancer Neg Hx   ? ? ?Social History ?Social History  ? ?Tobacco Use  ? Smoking status: Never  ? Smokeless tobacco: Never  ?Vaping Use  ? Vaping Use: Never  used  ?Substance Use Topics  ? Alcohol use: No  ? Drug use: No  ? ? ? ?Allergies   ?Penicillins, Morphine and related, Chlorzoxazone, Ciprofloxacin, Clarithromycin, Clindamycin hcl, Codeine sulfate, Conjugated estrogens, Hydrocodone bit-homatrop mbr, Hydromorphone, Meperidine, Propoxyphene, Sulfa antibiotics, and Tioconazole ? ? ?Review of Systems ?Review of Systems ?Defer to HPI  ? ? ?Physical Exam ?Triage Vital Signs ?ED Triage Vitals  ?Enc Vitals Group  ?   BP 06/06/21 1548 (!) 154/75  ?   Pulse Rate 06/06/21 1548 91  ?   Resp 06/06/21 1548 14  ?   Temp 06/06/21 1548 98.5 ?F (36.9 ?C)  ?   Temp Source 06/06/21 1548 Oral  ?   SpO2 06/06/21 1548 93 %  ?   Weight 06/06/21 1545 201 lb 15.1 oz (91.6 kg)  ?   Height 06/06/21 1545 '4\' 11"'$  (1.499 m)  ?   Head Circumference --   ?   Peak Flow --   ?   Pain Score 06/06/21 1545 6  ?   Pain Loc --   ?   Pain Edu? --   ?   Excl. in [place]? --   ? ?No data found. ? ?Updated Vital Signs ?BP (!) 154/75 (BP Location: Left Arm)   Pulse 91   Temp 98.5 ?F (36.9 ?C) (Oral)   Resp 14   Ht '4\' 11"'$  (1.499 m)   Wt 201 lb 15.1 oz (91.6 kg)   SpO2 93%   BMI 40.79 kg/m?  ? ?Visual Acuity ?Right Eye Distance:   ?Left Eye Distance:   ?Bilateral Distance:   ? ?Right Eye Near:    ?Left Eye Near:    ?Bilateral Near:    ? ?Physical Exam ?Constitutional:   ?   Appearance: Normal appearance.  ?HENT:  ?   Head: Normocephalic.  ?Eyes:  ?   Extraocular Movements: Extraocular movements intact.  ?Pulmonary:

## 2021-06-06 NOTE — ED Triage Notes (Signed)
Patient c/o burning when urinating and increase in urinary frequency that started this morning.  ?

## 2021-06-06 NOTE — Discharge Instructions (Signed)
Urinalysis was positive for nitrates which is indicative of a urinary infection, your urine sample has been sent to the lab to determine which bacteria will grow, if any changes need to be made to your medication you will be notified ? ?Given use of Keflex taking twice daily for the next 5 days, you should start to see improvement in your symptoms after 2 days of medication use ? ?You may continue to use Azo for another day to help minimize burning sensation ? ?Increase your fluid intake through use of water to further help flush your kidneys and bladder ? ?As always continue to practice good feminine hygiene ? ?Please follow-up if your symptoms continue to persist past use of medication ?

## 2021-06-08 LAB — URINE CULTURE: Culture: NO GROWTH

## 2021-06-10 ENCOUNTER — Ambulatory Visit (INDEPENDENT_AMBULATORY_CARE_PROVIDER_SITE_OTHER): Payer: PPO | Admitting: Vascular Surgery

## 2021-06-10 DIAGNOSIS — S22080D Wedge compression fracture of T11-T12 vertebra, subsequent encounter for fracture with routine healing: Secondary | ICD-10-CM | POA: Diagnosis not present

## 2021-06-10 DIAGNOSIS — N3010 Interstitial cystitis (chronic) without hematuria: Secondary | ICD-10-CM | POA: Diagnosis not present

## 2021-06-10 DIAGNOSIS — I872 Venous insufficiency (chronic) (peripheral): Secondary | ICD-10-CM | POA: Diagnosis not present

## 2021-06-10 DIAGNOSIS — F32A Depression, unspecified: Secondary | ICD-10-CM | POA: Diagnosis not present

## 2021-06-10 DIAGNOSIS — R262 Difficulty in walking, not elsewhere classified: Secondary | ICD-10-CM | POA: Diagnosis not present

## 2021-06-10 DIAGNOSIS — S32010D Wedge compression fracture of first lumbar vertebra, subsequent encounter for fracture with routine healing: Secondary | ICD-10-CM | POA: Diagnosis not present

## 2021-06-10 DIAGNOSIS — I890 Lymphedema, not elsewhere classified: Secondary | ICD-10-CM | POA: Diagnosis not present

## 2021-06-10 DIAGNOSIS — J4520 Mild intermittent asthma, uncomplicated: Secondary | ICD-10-CM | POA: Diagnosis not present

## 2021-06-15 DIAGNOSIS — R262 Difficulty in walking, not elsewhere classified: Secondary | ICD-10-CM | POA: Diagnosis not present

## 2021-06-15 DIAGNOSIS — S32010D Wedge compression fracture of first lumbar vertebra, subsequent encounter for fracture with routine healing: Secondary | ICD-10-CM | POA: Diagnosis not present

## 2021-06-15 DIAGNOSIS — N3010 Interstitial cystitis (chronic) without hematuria: Secondary | ICD-10-CM | POA: Diagnosis not present

## 2021-06-15 DIAGNOSIS — I890 Lymphedema, not elsewhere classified: Secondary | ICD-10-CM | POA: Diagnosis not present

## 2021-06-15 DIAGNOSIS — J4520 Mild intermittent asthma, uncomplicated: Secondary | ICD-10-CM | POA: Diagnosis not present

## 2021-06-15 DIAGNOSIS — I872 Venous insufficiency (chronic) (peripheral): Secondary | ICD-10-CM | POA: Diagnosis not present

## 2021-06-15 DIAGNOSIS — S22080D Wedge compression fracture of T11-T12 vertebra, subsequent encounter for fracture with routine healing: Secondary | ICD-10-CM | POA: Diagnosis not present

## 2021-06-22 DIAGNOSIS — R399 Unspecified symptoms and signs involving the genitourinary system: Secondary | ICD-10-CM | POA: Diagnosis not present

## 2021-06-22 DIAGNOSIS — R8290 Unspecified abnormal findings in urine: Secondary | ICD-10-CM | POA: Diagnosis not present

## 2021-07-01 DIAGNOSIS — Z79899 Other long term (current) drug therapy: Secondary | ICD-10-CM | POA: Diagnosis not present

## 2021-07-01 DIAGNOSIS — N3010 Interstitial cystitis (chronic) without hematuria: Secondary | ICD-10-CM | POA: Diagnosis not present

## 2021-07-01 DIAGNOSIS — M545 Low back pain, unspecified: Secondary | ICD-10-CM | POA: Diagnosis not present

## 2021-07-01 DIAGNOSIS — F3340 Major depressive disorder, recurrent, in remission, unspecified: Secondary | ICD-10-CM | POA: Diagnosis not present

## 2021-07-01 DIAGNOSIS — G8929 Other chronic pain: Secondary | ICD-10-CM | POA: Diagnosis not present

## 2021-07-01 DIAGNOSIS — J4520 Mild intermittent asthma, uncomplicated: Secondary | ICD-10-CM | POA: Diagnosis not present

## 2021-07-01 DIAGNOSIS — R262 Difficulty in walking, not elsewhere classified: Secondary | ICD-10-CM | POA: Diagnosis not present

## 2021-07-01 DIAGNOSIS — M0579 Rheumatoid arthritis with rheumatoid factor of multiple sites without organ or systems involvement: Secondary | ICD-10-CM | POA: Diagnosis not present

## 2021-07-01 DIAGNOSIS — W010XXD Fall on same level from slipping, tripping and stumbling without subsequent striking against object, subsequent encounter: Secondary | ICD-10-CM | POA: Diagnosis not present

## 2021-07-01 DIAGNOSIS — M84422A Pathological fracture, left humerus, initial encounter for fracture: Secondary | ICD-10-CM | POA: Diagnosis not present

## 2021-07-01 DIAGNOSIS — M25512 Pain in left shoulder: Secondary | ICD-10-CM | POA: Diagnosis not present

## 2021-07-05 DIAGNOSIS — M25512 Pain in left shoulder: Secondary | ICD-10-CM | POA: Diagnosis not present

## 2021-07-05 DIAGNOSIS — M19112 Post-traumatic osteoarthritis, left shoulder: Secondary | ICD-10-CM | POA: Diagnosis not present

## 2021-07-08 DIAGNOSIS — Z7982 Long term (current) use of aspirin: Secondary | ICD-10-CM | POA: Diagnosis not present

## 2021-07-08 DIAGNOSIS — S32010D Wedge compression fracture of first lumbar vertebra, subsequent encounter for fracture with routine healing: Secondary | ICD-10-CM | POA: Diagnosis not present

## 2021-07-08 DIAGNOSIS — F32A Depression, unspecified: Secondary | ICD-10-CM | POA: Diagnosis not present

## 2021-07-08 DIAGNOSIS — W19XXXD Unspecified fall, subsequent encounter: Secondary | ICD-10-CM | POA: Diagnosis not present

## 2021-07-08 DIAGNOSIS — I872 Venous insufficiency (chronic) (peripheral): Secondary | ICD-10-CM | POA: Diagnosis not present

## 2021-07-08 DIAGNOSIS — J4520 Mild intermittent asthma, uncomplicated: Secondary | ICD-10-CM | POA: Diagnosis not present

## 2021-07-08 DIAGNOSIS — S22080D Wedge compression fracture of T11-T12 vertebra, subsequent encounter for fracture with routine healing: Secondary | ICD-10-CM | POA: Diagnosis not present

## 2021-07-08 DIAGNOSIS — I890 Lymphedema, not elsewhere classified: Secondary | ICD-10-CM | POA: Diagnosis not present

## 2021-07-08 DIAGNOSIS — R262 Difficulty in walking, not elsewhere classified: Secondary | ICD-10-CM | POA: Diagnosis not present

## 2021-07-15 ENCOUNTER — Encounter (INDEPENDENT_AMBULATORY_CARE_PROVIDER_SITE_OTHER): Payer: Self-pay | Admitting: Vascular Surgery

## 2021-07-15 ENCOUNTER — Ambulatory Visit (INDEPENDENT_AMBULATORY_CARE_PROVIDER_SITE_OTHER): Payer: PPO | Admitting: Vascular Surgery

## 2021-07-15 VITALS — BP 156/83 | HR 87 | Resp 16

## 2021-07-15 DIAGNOSIS — I890 Lymphedema, not elsewhere classified: Secondary | ICD-10-CM

## 2021-07-15 DIAGNOSIS — K219 Gastro-esophageal reflux disease without esophagitis: Secondary | ICD-10-CM | POA: Diagnosis not present

## 2021-07-15 DIAGNOSIS — E785 Hyperlipidemia, unspecified: Secondary | ICD-10-CM | POA: Diagnosis not present

## 2021-07-15 DIAGNOSIS — J45909 Unspecified asthma, uncomplicated: Secondary | ICD-10-CM | POA: Diagnosis not present

## 2021-07-15 NOTE — Progress Notes (Signed)
? ? ? ? ?MRN : [id number] ? ?Amber Kirk is a 87 y.o. (02/26/1935) female who presents with chief complaint of legs swell. ? ?History of Present Illness:  ? ?The patient returns to the office for followup evaluation regarding leg swelling.  The swelling has improved quite a bit and the pain associated with swelling has decreased substantially. There have not been any interval development of a ulcerations or wounds. ? ?Since the previous visit the patient has been wearing graduated compression stockings and has noted some improvement in the lymphedema. The patient has been using compression routinely morning until night. ? ?The patient also states elevation during the day and exercise (such as walking) is being done too. ? ?  ? ?No outpatient medications have been marked as taking for the 07/15/21 encounter (Appointment) with Delana Meyer, Dolores Lory, MD.  ? ? ?Past Medical History:  ?Diagnosis Date  ? Allergy   ? Asthma   ? Bowel obstruction ([place]) 06/2019  ? Complication of anesthesia   ? COPD (chronic obstructive pulmonary disease) ([place])   ? GERD (gastroesophageal reflux disease)   ? Heart murmur   ? Interstitial cystitis   ? PONV (postoperative nausea and vomiting)   ? after 1st cataract surgery  ? Sleep apnea   ? mild-no cpap   ? ? ?Past Surgical History:  ?Procedure Laterality Date  ? ABDOMINAL HYSTERECTOMY    ? APPENDECTOMY    ? BLADDER SURGERY    ? BREAST BIOPSY Right 04/24/2020  ? stereo bx, x clip,  - BENIGN BREAST TISSUE WITH PATCHY DENSE STROMAL FIBROSIS  ? CHOLECYSTECTOMY    ? EYE SURGERY    ? bil cataracts  ? KYPHOPLASTY N/A 07/05/2019  ? Procedure: T12 KYPHOPLASTY;  Surgeon: Hessie Knows, MD;  Location: [facility] ORS;  Service: Orthopedics;  Laterality: N/A;  ? KYPHOPLASTY N/A 07/23/2019  ? Procedure: L1 KYPHOPLASTY;  Surgeon: Hessie Knows, MD;  Location: [facility] ORS;  Service: Orthopedics;  Laterality: N/A;  ? TONSILLECTOMY    ? ? ?Social History ?Social History  ? ?Tobacco Use  ? Smoking status: Never   ? Smokeless tobacco: Never  ?Vaping Use  ? Vaping Use: Never used  ?Substance Use Topics  ? Alcohol use: No  ? Drug use: No  ? ? ?Family History ?Family History  ?Problem Relation Age of Onset  ? Heart disease Mother   ? Stroke Father   ? Breast cancer Neg Hx   ? ? ?Allergies  ?Allergen Reactions  ? Penicillins Hives, Rash and Other (See Comments)  ?  And itching  ? Morphine And Related Nausea And Vomiting and Nausea Only  ? Chlorzoxazone Nausea And Vomiting  ? Ciprofloxacin Nausea And Vomiting  ? Clarithromycin Nausea And Vomiting  ? Clindamycin Hcl Nausea And Vomiting  ? Codeine Sulfate Nausea And Vomiting  ? Conjugated Estrogens Nausea And Vomiting  ? Hydrocodone Bit-Homatrop Mbr Nausea And Vomiting  ? Hydromorphone Nausea And Vomiting  ? Meperidine Nausea And Vomiting  ? Propoxyphene Nausea And Vomiting  ? Sulfa Antibiotics Nausea And Vomiting  ? Tioconazole Nausea Only  ? ? ? ?REVIEW OF SYSTEMS (Negative unless checked) ? ?Constitutional: '[]'$ Weight loss  '[]'$ Fever  '[]'$ Chills ?Cardiac: '[]'$ Chest pain   '[]'$ Chest pressure   '[]'$ Palpitations   '[]'$ Shortness of breath when laying flat   '[]'$ Shortness of breath with exertion. ?Vascular:  '[]'$ Pain in legs with walking   '[x]'$ Pain in legs with standing  '[]'$ History of DVT   '[]'$ Phlebitis   '[x]'$ Swelling in legs   '[]'$ Varicose veins   '[]'$   Non-healing ulcers ?Pulmonary:   '[]'$ Uses home oxygen   '[]'$ Productive cough   '[]'$ Hemoptysis   '[]'$ Wheeze  '[]'$ COPD   '[x]'$ Asthma ?Neurologic:  '[]'$ Dizziness   '[]'$ Seizures   '[]'$ History of stroke   '[]'$ History of TIA  '[]'$ Aphasia   '[]'$ Vissual changes   '[]'$ Weakness or numbness in arm   '[]'$ Weakness or numbness in leg ?Musculoskeletal:   '[]'$ Joint swelling   '[]'$ Joint pain   '[]'$ Low back pain ?Hematologic:  '[]'$ Easy bruising  '[]'$ Easy bleeding   '[]'$ Hypercoagulable state   '[]'$ Anemic ?Gastrointestinal:  '[]'$ Diarrhea   '[]'$ Vomiting  '[x]'$ Gastroesophageal reflux/heartburn   '[]'$ Difficulty swallowing. ?Genitourinary:  '[]'$ Chronic kidney disease   '[]'$ Difficult urination  '[]'$ Frequent urination   '[]'$ Blood in  urine ?Skin:  '[]'$ Rashes   '[]'$ Ulcers  ?Psychological:  '[]'$ History of anxiety   '[]'$  History of major depression. ? ?Physical Examination ? ?There were no vitals filed for this visit. ?There is no height or weight on file to calculate BMI. ?Gen: WD/WN, NAD ?Head: [place]/AT, No temporalis wasting.  ?Ear/Nose/Throat: Hearing grossly intact, nares w/o erythema or drainage, pinna without lesions ?Eyes: PER, EOMI, sclera nonicteric.  ?Neck: Supple, no gross masses.  No JVD.  ?Pulmonary:  Good air movement, no audible wheezing, no use of accessory muscles.  ?Cardiac: RRR, precordium not hyperdynamic. ?Vascular:  scattered varicosities present bilaterally.  Mild venous stasis changes to the legs bilaterally.  1-2+ soft pitting edema  ?Vessel Right Left  ?Radial Palpable Palpable  ?Gastrointestinal: soft, non-distended. No guarding/no peritoneal signs.  ?Musculoskeletal: M/S 5/5 throughout.  No deformity.  ?Neurologic: CN 2-12 intact. Pain and light touch intact in extremities.  Symmetrical.  Speech is fluent. Motor exam as listed above. ?Psychiatric: Judgment intact, Mood & affect appropriate for pt's clinical situation. ?Dermatologic: Venous rashes no ulcers noted.  No changes consistent with cellulitis. ?Lymph : No lichenification or skin changes of chronic lymphedema. ? ?CBC ?Lab Results  ?Component Value Date  ? WBC 10.8 (H) 08/31/2020  ? HGB 13.0 08/31/2020  ? HCT 40.2 08/31/2020  ? MCV 85.2 08/31/2020  ? PLT 244 08/31/2020  ? ? ?BMET ?   ?Component Value Date/Time  ? NA 131 (L) 08/31/2020 1212  ? NA 139 06/22/2013 2023  ? K 4.4 08/31/2020 1212  ? K 3.9 06/22/2013 2023  ? CL 98 08/31/2020 1212  ? CL 103 06/22/2013 2023  ? CO2 30 08/31/2020 1212  ? CO2 29 06/22/2013 2023  ? GLUCOSE 144 (H) 08/31/2020 1212  ? GLUCOSE 124 (H) 06/22/2013 2023  ? BUN 21 08/31/2020 1212  ? BUN 16 06/22/2013 2023  ? CREATININE 0.78 08/31/2020 1212  ? CREATININE 0.91 06/22/2013 2023  ? CALCIUM 9.5 08/31/2020 1212  ? CALCIUM 9.3 06/22/2013 2023  ?  GFRNONAA >60 08/31/2020 1212  ? GFRNONAA >60 06/22/2013 2023  ? GFRAA >60 07/15/2019 1532  ? GFRAA >60 06/22/2013 2023  ? ?CrCl cannot be calculated (Patient's most recent lab result is older than the maximum 21 days allowed.). ? ?COAG ?Lab Results  ?Component Value Date  ? INR 0.9 09/12/2019  ? ? ?Radiology ?No results found. ? ? ?Assessment/Plan ?1. Lymphedema ?Recommend: ? ?No surgery or intervention at this point in time. ? ?I have reviewed my discussion with the patient regarding venous insufficiency and why it causes symptoms. I have discussed with the patient the chronic skin changes that accompany venous insufficiency and the long term sequela such as ulceration. Patient will contnue wearing graduated compression stockings on a daily basis, as this has provided excellent control of his edema. The patient will  put the stockings on first thing in the morning and removing them in the evening. The patient is reminded not to sleep in the stockings. ? ?In addition, behavioral modification including elevation during the day will be initiated. ? ?Exercise is strongly encouraged. ? ?Previous duplex ultrasound of the lower extremities shows normal deep system, no significant superficial reflux was identified. ? ?Given the patient's good control and lack of any problems regarding the venous insufficiency and lymphedema she should continue her lymph pump.   ? ?The patient will follow up with me PRN should anything change.  The patient voices agreement with this plan.  ? ?2. Hyperlipidemia, unspecified hyperlipidemia type ?Continue statin as ordered and reviewed, no changes at this time  ? ?3. Asthma, unspecified asthma severity, unspecified whether complicated, unspecified whether persistent ?Continue pulmonary medications and aerosols as already ordered, these medications have been reviewed and there are no changes at this time. ?  ? ?4. Gastroesophageal reflux disease, unspecified whether esophagitis present ?Continue  PPI as already ordered, this medication has been reviewed and there are no changes at this time. ? ?Avoidence of caffeine and alcohol ? ?Moderate elevation of the head of the bed   ? ? ? ?Hortencia Pilar, MD ? ?5/1

## 2021-07-16 DIAGNOSIS — S22080A Wedge compression fracture of T11-T12 vertebra, initial encounter for closed fracture: Secondary | ICD-10-CM | POA: Diagnosis not present

## 2021-07-17 ENCOUNTER — Encounter (INDEPENDENT_AMBULATORY_CARE_PROVIDER_SITE_OTHER): Payer: Self-pay | Admitting: Vascular Surgery

## 2021-07-28 DIAGNOSIS — J4520 Mild intermittent asthma, uncomplicated: Secondary | ICD-10-CM | POA: Diagnosis not present

## 2021-07-28 DIAGNOSIS — S32010D Wedge compression fracture of first lumbar vertebra, subsequent encounter for fracture with routine healing: Secondary | ICD-10-CM | POA: Diagnosis not present

## 2021-07-28 DIAGNOSIS — I872 Venous insufficiency (chronic) (peripheral): Secondary | ICD-10-CM | POA: Diagnosis not present

## 2021-07-28 DIAGNOSIS — S22080D Wedge compression fracture of T11-T12 vertebra, subsequent encounter for fracture with routine healing: Secondary | ICD-10-CM | POA: Diagnosis not present

## 2021-07-28 DIAGNOSIS — I890 Lymphedema, not elsewhere classified: Secondary | ICD-10-CM | POA: Diagnosis not present

## 2021-07-28 DIAGNOSIS — F32A Depression, unspecified: Secondary | ICD-10-CM | POA: Diagnosis not present

## 2021-07-28 DIAGNOSIS — R262 Difficulty in walking, not elsewhere classified: Secondary | ICD-10-CM | POA: Diagnosis not present

## 2021-08-05 DIAGNOSIS — R262 Difficulty in walking, not elsewhere classified: Secondary | ICD-10-CM | POA: Diagnosis not present

## 2021-08-05 DIAGNOSIS — S22080D Wedge compression fracture of T11-T12 vertebra, subsequent encounter for fracture with routine healing: Secondary | ICD-10-CM | POA: Diagnosis not present

## 2021-08-05 DIAGNOSIS — J4520 Mild intermittent asthma, uncomplicated: Secondary | ICD-10-CM | POA: Diagnosis not present

## 2021-08-05 DIAGNOSIS — S32010D Wedge compression fracture of first lumbar vertebra, subsequent encounter for fracture with routine healing: Secondary | ICD-10-CM | POA: Diagnosis not present

## 2021-08-05 DIAGNOSIS — F32A Depression, unspecified: Secondary | ICD-10-CM | POA: Diagnosis not present

## 2021-08-05 DIAGNOSIS — I890 Lymphedema, not elsewhere classified: Secondary | ICD-10-CM | POA: Diagnosis not present

## 2021-08-05 DIAGNOSIS — W19XXXD Unspecified fall, subsequent encounter: Secondary | ICD-10-CM | POA: Diagnosis not present

## 2021-08-05 DIAGNOSIS — I872 Venous insufficiency (chronic) (peripheral): Secondary | ICD-10-CM | POA: Diagnosis not present

## 2021-08-05 DIAGNOSIS — Z7982 Long term (current) use of aspirin: Secondary | ICD-10-CM | POA: Diagnosis not present

## 2021-08-17 ENCOUNTER — Ambulatory Visit
Admission: EM | Admit: 2021-08-17 | Discharge: 2021-08-17 | Disposition: A | Discharge status: Home / Self Care | Payer: PPO | Attending: Internal Medicine | Admitting: Internal Medicine

## 2021-08-17 DIAGNOSIS — R300 Dysuria: Secondary | ICD-10-CM | POA: Diagnosis not present

## 2021-08-17 DIAGNOSIS — B379 Candidiasis, unspecified: Secondary | ICD-10-CM | POA: Diagnosis not present

## 2021-08-17 LAB — URINALYSIS, ROUTINE W REFLEX MICROSCOPIC
Bilirubin Urine: NEGATIVE
Glucose, UA: 100 mg/dL — AB
Hgb urine dipstick: NEGATIVE
Ketones, ur: NEGATIVE mg/dL
Leukocytes,Ua: NEGATIVE
Nitrite: POSITIVE — AB
Protein, ur: 30 mg/dL — AB
Specific Gravity, Urine: 1.015 (ref 1.005–1.030)
pH: 6 (ref 5.0–8.0)

## 2021-08-17 LAB — URINALYSIS, MICROSCOPIC (REFLEX): RBC / HPF: NONE SEEN RBC/hpf (ref 0–5)

## 2021-08-17 MED ORDER — FLUCONAZOLE 200 MG PO TABS: 200.0000 mg | ORAL_TABLET | Freq: Every day | ORAL | 0 refills | Status: AC

## 2021-08-17 MED ORDER — FLUCONAZOLE 200 MG PO TABS
200.0000 mg | ORAL_TABLET | Freq: Every day | ORAL | 0 refills | Status: DC
Start: 2021-08-17 — End: 2021-08-17

## 2021-08-17 NOTE — ED Triage Notes (Signed)
C/o dysuria x 2 days.

## 2021-08-17 NOTE — ED Provider Notes (Signed)
[facility]    CSN: [id number] Arrival date & time: 08/17/21  1138      History   Chief Complaint Chief Complaint  Patient presents with   Urinary Tract Infection    HPI Amber Kirk is a 87 y.o. female.  She presents today with 2-day history of dysuria, urinary frequency.  In the last couple of evenings, she has not slept well because of urinary frequency.    No fever.    She has a history of interstitial cystitis.  She uses topical estrogen 3 days a week, and trospium at bedtime.  She has taken Azo in the last couple of days for symptom relief.   Urinary Tract Infection   Past Medical History:  Diagnosis Date   Allergy    Asthma    Bowel obstruction ([place]) 22/0254   Complication of anesthesia    COPD (chronic obstructive pulmonary disease) (HCC)    GERD (gastroesophageal reflux disease)    Heart murmur    Interstitial cystitis    PONV (postoperative nausea and vomiting)    after 1st cataract surgery   Sleep apnea    mild-no cpap     Patient Active Problem List   Diagnosis Date Noted   Ambulatory dysfunction 10/29/2019   Lumbar stenosis without neurogenic claudication 09/16/2019   Chronic bilateral low back pain with bilateral sciatica 09/02/2019   H/O kyphoplasty 07/26/2019   Ileus ([place])    Constipation    Compression fracture of T12 vertebra ([place])    Colitis 06/27/2019   Generalized osteoarthritis of multiple sites 10/24/2018   Gastroesophageal reflux disease 10/24/2018   Major depressive disorder, recurrent, in remission ([place]) 10/24/2018   Pain in joint of left shoulder 07/13/2018   Primary localized osteoarthrosis of ankle and foot 06/13/2018   Morbid obesity with BMI of 40.0-44.9, adult ([place]) 03/29/2018   Chronic venous insufficiency 07/04/2016   Lymphedema 07/04/2016   Asthma 07/04/2016   Hyperlipidemia 07/04/2016   Mechanical breakdown of implanted electronic neurostimulator of peripheral nerve ([place]) 04/27/2016   Dysuria  02/10/2016   Trochanteric bursitis of both hips 04/13/2015   Tendinitis of left rotator cuff 04/01/2015   Hyperuricuria 06/05/2014   Pyuria 06/05/2014   Urinary tract infection 06/05/2014   Anxiety disorder 05/28/2014   Rheumatoid arthritis involving multiple sites with positive rheumatoid factor ([place]) 12/16/2013   Unspecified osteoarthritis, unspecified site 12/16/2013   Cough 09/11/2013   Chronic interstitial cystitis 12/21/2012   Female stress incontinence 12/21/2012   Incomplete emptying of bladder 12/21/2012   Increased frequency of urination 12/21/2012   Nocturia 12/21/2012   Other symptoms and signs involving the genitourinary system 12/21/2012    Past Surgical History:  Procedure Laterality Date   ABDOMINAL HYSTERECTOMY     APPENDECTOMY     BLADDER SURGERY     BREAST BIOPSY Right 04/24/2020   stereo bx, x clip,  - BENIGN BREAST TISSUE WITH PATCHY DENSE STROMAL FIBROSIS   CHOLECYSTECTOMY     EYE SURGERY     bil cataracts   KYPHOPLASTY N/A 07/05/2019   Procedure: T12 KYPHOPLASTY;  Surgeon: Hessie Knows, MD;  Location: [facility] ORS;  Service: Orthopedics;  Laterality: N/A;   KYPHOPLASTY N/A 07/23/2019   Procedure: L1 KYPHOPLASTY;  Surgeon: Hessie Knows, MD;  Location: [facility] OR[facility];  Service: Orthopedics;  Laterality: N/A;   TONSILLECTOMY       Home Medications    Prior to Admission medications   Medication Sig Start Date End Date Taking? Authorizing Provider  albuterol (VENTOLIN HFA)  108 (90 Base) MCG/ACT inhaler Inhale 1-2 puffs into the lungs every 6 (six) hours as needed for wheezing or shortness of breath.    [provider]  aspirin EC 81 MG tablet Take 81 mg by mouth daily.  04/25/12   [provider]  azelastine (ASTELIN) 0.1 % nasal spray Place 1 spray into both nostrils 2 (two) times daily. Use in each nostril as directed    [provider]  benzonatate (TESSALON) 100 MG capsule Take 2 capsules (200 mg total) by mouth 3 (three) times daily  as needed. 10/22/20   Margarette Canada, NP  Calcium Carbonate (CALCIUM 500 PO) Take 500 mg by mouth daily.    [provider]  cetirizine (ZYRTEC) 10 MG tablet Take 10 mg by mouth every morning.     [provider]  cholecalciferol (VITAMIN D) 25 MCG (1000 UNIT) tablet Take 1,000 Units by mouth daily.    [provider]  docusate sodium (COLACE) 100 MG capsule Take 1 capsule (100 mg total) by mouth 2 (two) times daily. 06/29/19   Fritzi Mandes, MD  doxepin (SINEQUAN) 10 MG capsule Take 10 mg by mouth at bedtime. 07/20/20   [provider]  estradiol (ESTRACE) 0.1 MG/GM vaginal cream Place 1 Applicatorful vaginally 3 (three) times a week.    [provider]  fluconazole (DIFLUCAN) 200 MG tablet Take 1 tablet (200 mg total) by mouth daily for 7 days. 08/17/21 08/24/21  Wynona Luna, MD  fluticasone (FLONASE) 50 MCG/ACT nasal spray Place 1 spray into both nostrils in the morning and at bedtime.     [provider]  gabapentin (NEURONTIN) 100 MG capsule Take 100 mg by mouth 2 (two) times daily. 1 am and 2 tabs at evening Patient not taking: Reported on 07/15/2021 06/09/20   [provider]  hydroxychloroquine (PLAQUENIL) 200 MG tablet Take 200 mg by mouth daily.    [provider]  ipratropium (ATROVENT) 0.06 % nasal spray Place 2 sprays into both nostrils 4 (four) times daily. 10/22/20   Margarette Canada, NP  lidocaine (LIDODERM) 5 % Place 1 patch onto the skin daily. Remove & Discard patch within 12 hours or as directed by MD    [provider]  LINZESS 145 MCG CAPS capsule Take 145 mcg by mouth daily as needed (constipation.).  06/25/19   [provider]  lovastatin (MEVACOR) 20 MG tablet Take 20 mg by mouth daily with supper.     [provider]  montelukast (SINGULAIR) 10 MG tablet Take 10 mg by mouth every evening.     [provider]  olopatadine (PATANOL) 0.1 % ophthalmic solution Place 1 drop into  both eyes 2 (two) times daily as needed for allergies.     [provider]  omeprazole (PRILOSEC) 20 MG capsule Take 20 mg by mouth in the morning and at bedtime.     [provider]  ondansetron (ZOFRAN-ODT) 4 MG disintegrating tablet Take 4 mg by mouth every 8 (eight) hours as needed for nausea/vomiting. 07/01/19   [provider]  polyethylene glycol (MIRALAX / GLYCOLAX) 17 g packet Take 17 g by mouth 2 (two) times daily. 06/29/19   Fritzi Mandes, MD  SYMBICORT 160-4.5 MCG/ACT inhaler Inhale 2 puffs into the lungs in the morning and at bedtime.     [provider]  trospium (SANCTURA) 20 MG tablet Take 20 mg by mouth at bedtime.    [provider]    Family History Family  History  Problem Relation Age of Onset   Heart disease Mother    Stroke Father    Breast cancer Neg Hx     Social History Social History   Tobacco Use   Smoking status: Never   Smokeless tobacco: Never  Vaping Use   Vaping Use: Never used  Substance Use Topics   Alcohol use: No   Drug use: No     Allergies   Penicillins, Morphine and related, Chlorzoxazone, Ciprofloxacin, Clarithromycin, Clindamycin hcl, Codeine sulfate, Conjugated estrogens, Hydrocodone bit-homatrop mbr, Hydromorphone, Meperidine, Propoxyphene, Sulfa antibiotics, and Tioconazole   Review of Systems Review of Systems see HPI   Physical Exam Triage Vital Signs ED Triage Vitals [08/17/21 1311]  Enc Vitals Group     BP 128/70     Pulse Rate 88     Resp 18     Temp 98.3 F (36.8 C)     Temp Source Oral     SpO2 100 %     Weight      Height      Pain Score      Pain Loc    Updated Vital Signs BP 128/70 (BP Location: Right Arm)   Pulse 88   Temp 98.3 F (36.8 C) (Oral)   Resp 18   SpO2 100%   Physical Exam Constitutional:      General: She is not in acute distress.    Appearance: She is not ill-appearing.     Comments: Good hygiene Has a care attendant with her, sitting in  wheelchair  HENT:     Head: Atraumatic.     Mouth/Throat:     Mouth: Mucous membranes are moist.  Eyes:     Conjunctiva/sclera:     Right eye: Right conjunctiva is not injected. No exudate.    Left eye: Left conjunctiva is not injected. No exudate.    Comments:  Conjugate gaze observed   Neck:     Comments: Turns head freely Cardiovascular:     Rate and Rhythm: Normal rate.  Pulmonary:     Effort: Pulmonary effort is normal. No respiratory distress.  Abdominal:     Comments: Abdomen protuberant  Musculoskeletal:     Comments: Brought into the urgent care in a wheelchair by care attendant  Skin:    General: Skin is warm and dry.     Comments: No cyanosis  Neurological:     Mental Status: She is alert.     Comments: Face symmetric, speech clear, coherent, logical      UC Treatments / Results  Labs (all labs ordered are listed, but only abnormal results are displayed) Labs Reviewed  URINALYSIS, ROUTINE W REFLEX MICROSCOPIC - Abnormal; Notable for the following components:      Result Value   Color, Urine AMBER (*)    Glucose, UA 100 (*)    Protein, ur 30 (*)    Nitrite POSITIVE (*)    All other components within normal limits  URINALYSIS, MICROSCOPIC (REFLEX) - Abnormal; Notable for the following components:   Bacteria, UA FEW (*)    All other components within normal limits  URINE CULTURE  Urine micro with 0-5 WBCs, 0 RBCs, few bacteria, 11-20 squames and hyphal yeast. Culture pending; urine cultures x 2 in 06/2021 were negative.  EKG NA  Radiology No results found. NA  Procedures Procedures (including critical care time) NA  Medications Ordered in UC Medications - No data to display NA  Final Clinical Impressions(s) / UC Diagnoses  Final diagnoses:  Dysuria  Yeast infection     Discharge Instructions      No danger signs today.  Urinalysis does not suggest UTI but does have yeast.  Urine culture pending to doublecheck. Prescription for  fluconazole (yeast medicine) sent to the pharmacy.  If this does not improve/resolve your urinary symptoms, would check in with your primary care provider and/or your interstitial cystitis physician for additional recommendations.       PDMP not reviewed this encounter.   Wynona Luna, MD 08/17/21 [phone]

## 2021-08-17 NOTE — Discharge Instructions (Signed)
No danger signs today.  Urinalysis does not suggest UTI but does have yeast.  Urine culture pending to doublecheck. Prescription for fluconazole (yeast medicine) sent to the pharmacy.  If this does not improve/resolve your urinary symptoms, would check in with your primary care provider and/or your interstitial cystitis physician for additional recommendations.

## 2021-08-18 LAB — URINE CULTURE: Culture: <10000 — AB

## 2021-09-02 DIAGNOSIS — I890 Lymphedema, not elsewhere classified: Secondary | ICD-10-CM | POA: Diagnosis not present

## 2021-09-02 DIAGNOSIS — E785 Hyperlipidemia, unspecified: Secondary | ICD-10-CM | POA: Diagnosis not present

## 2021-09-02 DIAGNOSIS — R7309 Other abnormal glucose: Secondary | ICD-10-CM | POA: Diagnosis not present

## 2021-09-09 ENCOUNTER — Ambulatory Visit
Admission: RE | Admit: 2021-09-09 | Discharge: 2021-09-09 | Disposition: A | Discharge status: Home / Self Care | Payer: PPO | Source: Ambulatory Visit | Attending: Internal Medicine | Admitting: Internal Medicine

## 2021-09-09 ENCOUNTER — Other Ambulatory Visit (HOSPITAL_COMMUNITY): Payer: Self-pay | Admitting: Internal Medicine

## 2021-09-09 ENCOUNTER — Other Ambulatory Visit: Payer: Self-pay | Admitting: Internal Medicine

## 2021-09-09 ENCOUNTER — Other Ambulatory Visit: Payer: Self-pay | Admitting: Physician Assistant

## 2021-09-09 DIAGNOSIS — M85872 Other specified disorders of bone density and structure, left ankle and foot: Secondary | ICD-10-CM | POA: Diagnosis not present

## 2021-09-09 DIAGNOSIS — M159 Polyosteoarthritis, unspecified: Secondary | ICD-10-CM | POA: Diagnosis not present

## 2021-09-09 DIAGNOSIS — R262 Difficulty in walking, not elsewhere classified: Secondary | ICD-10-CM | POA: Diagnosis not present

## 2021-09-09 DIAGNOSIS — I872 Venous insufficiency (chronic) (peripheral): Secondary | ICD-10-CM | POA: Diagnosis not present

## 2021-09-09 DIAGNOSIS — M7989 Other specified soft tissue disorders: Secondary | ICD-10-CM | POA: Diagnosis not present

## 2021-09-09 DIAGNOSIS — M0579 Rheumatoid arthritis with rheumatoid factor of multiple sites without organ or systems involvement: Secondary | ICD-10-CM | POA: Diagnosis not present

## 2021-09-09 DIAGNOSIS — Z9889 Other specified postprocedural states: Secondary | ICD-10-CM | POA: Diagnosis not present

## 2021-09-09 DIAGNOSIS — I359 Nonrheumatic aortic valve disorder, unspecified: Secondary | ICD-10-CM | POA: Diagnosis not present

## 2021-09-09 DIAGNOSIS — J4520 Mild intermittent asthma, uncomplicated: Secondary | ICD-10-CM | POA: Diagnosis not present

## 2021-09-09 DIAGNOSIS — M79672 Pain in left foot: Secondary | ICD-10-CM | POA: Diagnosis not present

## 2021-09-09 DIAGNOSIS — M19072 Primary osteoarthritis, left ankle and foot: Secondary | ICD-10-CM | POA: Diagnosis not present

## 2021-09-09 DIAGNOSIS — Z6841 Body Mass Index (BMI) 40.0 and over, adult: Secondary | ICD-10-CM | POA: Diagnosis not present

## 2021-09-09 DIAGNOSIS — M8588 Other specified disorders of bone density and structure, other site: Secondary | ICD-10-CM | POA: Diagnosis not present

## 2021-09-10 DIAGNOSIS — I890 Lymphedema, not elsewhere classified: Secondary | ICD-10-CM | POA: Diagnosis not present

## 2021-09-10 DIAGNOSIS — R262 Difficulty in walking, not elsewhere classified: Secondary | ICD-10-CM | POA: Diagnosis not present

## 2021-09-10 DIAGNOSIS — W19XXXD Unspecified fall, subsequent encounter: Secondary | ICD-10-CM | POA: Diagnosis not present

## 2021-09-10 DIAGNOSIS — S22080D Wedge compression fracture of T11-T12 vertebra, subsequent encounter for fracture with routine healing: Secondary | ICD-10-CM | POA: Diagnosis not present

## 2021-09-10 DIAGNOSIS — S32010D Wedge compression fracture of first lumbar vertebra, subsequent encounter for fracture with routine healing: Secondary | ICD-10-CM | POA: Diagnosis not present

## 2021-09-10 DIAGNOSIS — I872 Venous insufficiency (chronic) (peripheral): Secondary | ICD-10-CM | POA: Diagnosis not present

## 2021-09-10 DIAGNOSIS — J4520 Mild intermittent asthma, uncomplicated: Secondary | ICD-10-CM | POA: Diagnosis not present

## 2021-09-10 DIAGNOSIS — Z7982 Long term (current) use of aspirin: Secondary | ICD-10-CM | POA: Diagnosis not present

## 2021-09-10 DIAGNOSIS — F32A Depression, unspecified: Secondary | ICD-10-CM | POA: Diagnosis not present

## 2021-09-16 DIAGNOSIS — Z880 Allergy status to penicillin: Secondary | ICD-10-CM | POA: Diagnosis not present

## 2021-09-16 DIAGNOSIS — Z7982 Long term (current) use of aspirin: Secondary | ICD-10-CM | POA: Diagnosis not present

## 2021-09-16 DIAGNOSIS — M0579 Rheumatoid arthritis with rheumatoid factor of multiple sites without organ or systems involvement: Secondary | ICD-10-CM | POA: Diagnosis not present

## 2021-09-16 DIAGNOSIS — Z885 Allergy status to narcotic agent status: Secondary | ICD-10-CM | POA: Diagnosis not present

## 2021-09-16 DIAGNOSIS — Z881 Allergy status to other antibiotic agents status: Secondary | ICD-10-CM | POA: Diagnosis not present

## 2021-09-16 DIAGNOSIS — R350 Frequency of micturition: Secondary | ICD-10-CM | POA: Diagnosis not present

## 2021-09-16 DIAGNOSIS — Z79899 Other long term (current) drug therapy: Secondary | ICD-10-CM | POA: Diagnosis not present

## 2021-09-16 DIAGNOSIS — M05741 Rheumatoid arthritis with rheumatoid factor of right hand without organ or systems involvement: Secondary | ICD-10-CM | POA: Diagnosis not present

## 2021-09-16 DIAGNOSIS — M19041 Primary osteoarthritis, right hand: Secondary | ICD-10-CM | POA: Diagnosis not present

## 2021-09-16 DIAGNOSIS — E785 Hyperlipidemia, unspecified: Secondary | ICD-10-CM | POA: Diagnosis not present

## 2021-09-16 DIAGNOSIS — M05742 Rheumatoid arthritis with rheumatoid factor of left hand without organ or systems involvement: Secondary | ICD-10-CM | POA: Diagnosis not present

## 2021-09-16 DIAGNOSIS — K219 Gastro-esophageal reflux disease without esophagitis: Secondary | ICD-10-CM | POA: Diagnosis not present

## 2021-09-16 DIAGNOSIS — M79672 Pain in left foot: Secondary | ICD-10-CM | POA: Diagnosis not present

## 2021-09-16 DIAGNOSIS — R339 Retention of urine, unspecified: Secondary | ICD-10-CM | POA: Diagnosis not present

## 2021-09-16 DIAGNOSIS — N3941 Urge incontinence: Secondary | ICD-10-CM | POA: Diagnosis not present

## 2021-09-16 DIAGNOSIS — M19042 Primary osteoarthritis, left hand: Secondary | ICD-10-CM | POA: Diagnosis not present

## 2021-09-16 DIAGNOSIS — Z882 Allergy status to sulfonamides status: Secondary | ICD-10-CM | POA: Diagnosis not present

## 2021-09-16 DIAGNOSIS — N390 Urinary tract infection, site not specified: Secondary | ICD-10-CM | POA: Diagnosis not present

## 2021-10-19 DIAGNOSIS — I359 Nonrheumatic aortic valve disorder, unspecified: Secondary | ICD-10-CM | POA: Diagnosis not present

## 2021-10-19 DIAGNOSIS — M810 Age-related osteoporosis without current pathological fracture: Secondary | ICD-10-CM | POA: Diagnosis not present

## 2021-10-29 DIAGNOSIS — M159 Polyosteoarthritis, unspecified: Secondary | ICD-10-CM | POA: Diagnosis not present

## 2021-10-29 DIAGNOSIS — M0579 Rheumatoid arthritis with rheumatoid factor of multiple sites without organ or systems involvement: Secondary | ICD-10-CM | POA: Diagnosis not present

## 2021-10-29 DIAGNOSIS — M8000XD Age-related osteoporosis with current pathological fracture, unspecified site, subsequent encounter for fracture with routine healing: Secondary | ICD-10-CM | POA: Diagnosis not present

## 2021-10-29 DIAGNOSIS — Z79899 Other long term (current) drug therapy: Secondary | ICD-10-CM | POA: Diagnosis not present

## 2021-10-29 DIAGNOSIS — R609 Edema, unspecified: Secondary | ICD-10-CM | POA: Diagnosis not present

## 2021-11-03 DIAGNOSIS — Z6841 Body Mass Index (BMI) 40.0 and over, adult: Secondary | ICD-10-CM | POA: Diagnosis not present

## 2021-11-03 DIAGNOSIS — F3340 Major depressive disorder, recurrent, in remission, unspecified: Secondary | ICD-10-CM | POA: Diagnosis not present

## 2021-11-03 DIAGNOSIS — R262 Difficulty in walking, not elsewhere classified: Secondary | ICD-10-CM | POA: Diagnosis not present

## 2021-11-03 DIAGNOSIS — I890 Lymphedema, not elsewhere classified: Secondary | ICD-10-CM | POA: Diagnosis not present

## 2021-11-03 DIAGNOSIS — I350 Nonrheumatic aortic (valve) stenosis: Secondary | ICD-10-CM | POA: Diagnosis not present

## 2021-11-03 DIAGNOSIS — M25475 Effusion, left foot: Secondary | ICD-10-CM | POA: Diagnosis not present

## 2021-11-03 DIAGNOSIS — R600 Localized edema: Secondary | ICD-10-CM | POA: Diagnosis not present

## 2021-11-09 ENCOUNTER — Ambulatory Visit
Admission: EM | Admit: 2021-11-09 | Discharge: 2021-11-09 | Disposition: A | Discharge status: Home / Self Care | Payer: PPO | Attending: Internal Medicine | Admitting: Internal Medicine

## 2021-11-09 ENCOUNTER — Ambulatory Visit (INDEPENDENT_AMBULATORY_CARE_PROVIDER_SITE_OTHER): Payer: PPO

## 2021-11-09 DIAGNOSIS — W19XXXA Unspecified fall, initial encounter: Secondary | ICD-10-CM

## 2021-11-09 DIAGNOSIS — M25512 Pain in left shoulder: Secondary | ICD-10-CM

## 2021-11-09 DIAGNOSIS — G8911 Acute pain due to trauma: Secondary | ICD-10-CM

## 2021-11-09 NOTE — Discharge Instructions (Signed)
Xray of left shoulder today did not clearly show any new fracture, and pain is located in the acromioclavicular area (different than area previously broken). Would recommend home physical therapy to improve range of motion and strength in left shoulder and arm over the next couple weeks, as immediate pain subsides, and to avoid frozen shoulder.  Followup with Dr Ginette Pitman for additional recommendations.

## 2021-11-09 NOTE — ED Triage Notes (Signed)
Pt states she fell last night and have left shoulder pain.  Pt son is present.

## 2021-11-09 NOTE — ED Provider Notes (Signed)
[facility]    CSN: [id number] Arrival date & time: 11/09/21  7408      History   Chief Complaint No chief complaint on file. Left shoulder pain after fall last night  HPI Amber Kirk is a 87 y.o. female. She has pain with movement of the left shoulder.  Last evening she fell backwards from standing, towards cabinet/dresser, and thinks she tried to catch herself with her left arm.   Having a lot of pain now with movement of the left arm at shoulder.  Movement at elbow/wrist/hand is preserved, not painful.  No bruising observed.  No other injuries reported. Broke her proximal left humerus a while back and had PT for about a year, with improvement in function and pain.  HPI  Past Medical History:  Diagnosis Date   Allergy    Asthma    Bowel obstruction ([place]) 14/4818   Complication of anesthesia    COPD (chronic obstructive pulmonary disease) (HCC)    GERD (gastroesophageal reflux disease)    Heart murmur    Interstitial cystitis    PONV (postoperative nausea and vomiting)    after 1st cataract surgery   Sleep apnea    mild-no cpap     Patient Active Problem List   Diagnosis Date Noted   Ambulatory dysfunction 10/29/2019   Lumbar stenosis without neurogenic claudication 09/16/2019   Chronic bilateral low back pain with bilateral sciatica 09/02/2019   H/O kyphoplasty 07/26/2019   Ileus ([place])    Constipation    Compression fracture of T12 vertebra ([place])    Colitis 06/27/2019   Generalized osteoarthritis of multiple sites 10/24/2018   Gastroesophageal reflux disease 10/24/2018   Major depressive disorder, recurrent, in remission ([place]) 10/24/2018   Pain in joint of left shoulder 07/13/2018   Primary localized osteoarthrosis of ankle and foot 06/13/2018   Morbid obesity with BMI of 40.0-44.9, adult ([place]) 03/29/2018   Chronic venous insufficiency 07/04/2016   Lymphedema 07/04/2016   Asthma 07/04/2016   Hyperlipidemia 07/04/2016   Mechanical  breakdown of implanted electronic neurostimulator of peripheral nerve ([place]) 04/27/2016   Dysuria 02/10/2016   Trochanteric bursitis of both hips 04/13/2015   Tendinitis of left rotator cuff 04/01/2015   Hyperuricuria 06/05/2014   Pyuria 06/05/2014   Urinary tract infection 06/05/2014   Anxiety disorder 05/28/2014   Rheumatoid arthritis involving multiple sites with positive rheumatoid factor ([place]) 12/16/2013   Unspecified osteoarthritis, unspecified site 12/16/2013   Cough 09/11/2013   Chronic interstitial cystitis 12/21/2012   Female stress incontinence 12/21/2012   Incomplete emptying of bladder 12/21/2012   Increased frequency of urination 12/21/2012   Nocturia 12/21/2012   Other symptoms and signs involving the genitourinary system 12/21/2012    Past Surgical History:  Procedure Laterality Date   ABDOMINAL HYSTERECTOMY     APPENDECTOMY     BLADDER SURGERY     BREAST BIOPSY Right 04/24/2020   stereo bx, x clip,  - BENIGN BREAST TISSUE WITH PATCHY DENSE STROMAL FIBROSIS   CHOLECYSTECTOMY     EYE SURGERY     bil cataracts   KYPHOPLASTY N/A 07/05/2019   Procedure: T12 KYPHOPLASTY;  Surgeon: Hessie Knows, MD;  Location: [facility];  Service: Orthopedics;  Laterality: N/A;   KYPHOPLASTY N/A 07/23/2019   Procedure: L1 KYPHOPLASTY;  Surgeon: Hessie Knows, MD;  Location: [facility];  Service: Orthopedics;  Laterality: N/A;   TONSILLECTOMY       Home Medications    Prior to Admission medications   Medication Sig Start  Date End Date Taking? Authorizing Provider  albuterol (VENTOLIN HFA) 108 (90 Base) MCG/ACT inhaler Inhale 1-2 puffs into the lungs every 6 (six) hours as needed for wheezing or shortness of breath.    [provider]  aspirin EC 81 MG tablet Take 81 mg by mouth daily.  04/25/12   [provider]  azelastine (ASTELIN) 0.1 % nasal spray Place 1 spray into both nostrils 2 (two) times daily. Use in each nostril as directed    [provider]   benzonatate (TESSALON) 100 MG capsule Take 2 capsules (200 mg total) by mouth 3 (three) times daily as needed. 10/22/20   Margarette Canada, NP  Calcium Carbonate (CALCIUM 500 PO) Take 500 mg by mouth daily.    [provider]  cetirizine (ZYRTEC) 10 MG tablet Take 10 mg by mouth every morning.     [provider]  cholecalciferol (VITAMIN D) 25 MCG (1000 UNIT) tablet Take 1,000 Units by mouth daily.    [provider]  docusate sodium (COLACE) 100 MG capsule Take 1 capsule (100 mg total) by mouth 2 (two) times daily. 06/29/19   Fritzi Mandes, MD  doxepin (SINEQUAN) 10 MG capsule Take 10 mg by mouth at bedtime. 07/20/20   [provider]  estradiol (ESTRACE) 0.1 MG/GM vaginal cream Place 1 Applicatorful vaginally 3 (three) times a week.    [provider]  fluticasone (FLONASE) 50 MCG/ACT nasal spray Place 1 spray into both nostrils in the morning and at bedtime.     [provider]  gabapentin (NEURONTIN) 100 MG capsule Take 100 mg by mouth 2 (two) times daily. 1 am and 2 tabs at evening Patient not taking: Reported on 07/15/2021 06/09/20   [provider]  hydroxychloroquine (PLAQUENIL) 200 MG tablet Take 200 mg by mouth daily.    [provider]  ipratropium (ATROVENT) 0.06 % nasal spray Place 2 sprays into both nostrils 4 (four) times daily. 10/22/20   Margarette Canada, NP  leflunomide (ARAVA) 10 MG tablet Take 10 mg by mouth daily. 11/04/21   [provider]  lidocaine (LIDODERM) 5 % Place 1 patch onto the skin daily. Remove & Discard patch within 12 hours or as directed by MD    [provider]  LINZESS 145 MCG CAPS capsule Take 145 mcg by mouth daily as needed (constipation.).  06/25/19   [provider]  lovastatin (MEVACOR) 20 MG tablet Take 20 mg by mouth daily with supper.     [provider]  montelukast (SINGULAIR) 10 MG tablet Take 10 mg by mouth every evening.     [provider]   MYRBETRIQ 25 MG TB24 tablet Take 25 mg by mouth daily. 10/25/21   [provider]  olopatadine (PATANOL) 0.1 % ophthalmic solution Place 1 drop into both eyes 2 (two) times daily as needed for allergies.     [provider]  omeprazole (PRILOSEC) 20 MG capsule Take 20 mg by mouth in the morning and at bedtime.     [provider]  ondansetron (ZOFRAN-ODT) 4 MG disintegrating tablet Take 4 mg by mouth every 8 (eight) hours as needed for nausea/vomiting. 07/01/19   [provider]  polyethylene glycol (MIRALAX / GLYCOLAX) 17 g packet Take 17 g by mouth 2 (two) times daily. 06/29/19   Fritzi Mandes, MD  SYMBICORT 160-4.5 MCG/ACT inhaler Inhale 2 puffs into the lungs in the morning and at bedtime.     [provider]  trospium Cloyd Stagers) 20  MG tablet Take 20 mg by mouth at bedtime.    [provider]    Family History Family History  Problem Relation Age of Onset   Heart disease Mother    Stroke Father    Breast cancer Neg Hx     Social History Social History   Tobacco Use   Smoking status: Never   Smokeless tobacco: Never  Vaping Use   Vaping Use: Never used  Substance Use Topics   Alcohol use: No   Drug use: No     Allergies   Penicillins, Morphine and related, Chlorzoxazone, Ciprofloxacin, Clarithromycin, Clindamycin hcl, Codeine sulfate, Conjugated estrogens, Hydrocodone bit-homatrop mbr, Hydromorphone, Meperidine, Propoxyphene, Sulfa antibiotics, and Tioconazole   Review of Systems Review of Systems see HPI   Physical Exam Triage Vital Signs ED Triage Vitals  Enc Vitals Group     BP 11/09/21 0858 137/68     Pulse Rate 11/09/21 0854 90     Resp 11/09/21 0854 16     Temp 11/09/21 0854 98.4 F (36.9 C)     Temp Source 11/09/21 0854 Oral     SpO2 11/09/21 0858 93 %     Weight 11/09/21 0856 200 lb (90.7 kg)     Height 11/09/21 0856 '4\' 11"'$  (1.499 m)     Pain Score 11/09/21 0855 8     Pain Loc --    Updated Vital  Signs BP 137/68 (BP Location: Right Arm)   Pulse 91   Temp 98.4 F (36.9 C) (Oral)   Resp 16   Ht '4\' 11"'$  (1.499 m)   Wt 90.7 kg   SpO2 93%   BMI 40.40 kg/m   Physical Exam Constitutional:      General: She is not in acute distress.    Appearance: She is not ill-appearing.     Comments: Good hygiene Accompanied by son Seated in wheelchair  HENT:     Head: Atraumatic.     Mouth/Throat:     Mouth: Mucous membranes are moist.  Neck:     Comments: Turns head freely Cardiovascular:     Rate and Rhythm: Normal rate.  Pulmonary:     Effort: Pulmonary effort is normal. No respiratory distress.  Musculoskeletal:     Comments: Left elbow flexion/extension unrestricted, able to pronate/supine left wrist, make fist Very painful to move left arm at shoulder, not able to actively abduct due to pain. Passive abduction to about 90 degrees with restriction due to pain.  Describes worst pain as localized to [facility] joint area, not able to reproduce with palpation, no focal bony tenderness of shoulder/humerus/clavicle or AC joint.  Skin:    General: Skin is warm and dry.  Neurological:     Mental Status: She is alert.     Comments: Face symmetric, speech clear/coherent/logical      UC Treatments / Results  Labs (all labs ordered are listed, but only abnormal results are displayed) Labs Reviewed - No data to display NA  EKG NA  Radiology DG Shoulder Left  Result Date: 11/09/2021 CLINICAL DATA:  Fall, pain EXAM: LEFT SHOULDER - 2+ VIEW COMPARISON:  08/31/2020 FINDINGS: No acute fracture or dislocation of the left shoulder. All views submitted for review are significantly internally rotated, which somewhat limits comparison to prior. Chronic, impacted fracture deformity of the proximal left humerus, which does not appear significantly changed compared to partially imaged appearance on prior chest radiographs. Moderate acromioclavicular and glenohumeral arthrosis. IMPRESSION: No acute  fracture or dislocation of the  left shoulder. All views submitted for review are significantly internally rotated, which somewhat limits comparison to prior. Chronic, impacted fracture deformity of the proximal left humerus, which does not appear significantly changed compared to partially imaged appearance on prior chest radiographs. Additional radiographic views or CT may be helpful to assess for acute on chronic fracture if clinically suspected. Electronically Signed   By: Delanna Ahmadi M.D.   On: 11/09/2021 09:53    Procedures Procedures (including critical care time) NA  Medications Ordered in UC Medications - No data to display NA  Final Clinical Impressions(s) / UC Diagnoses   Final diagnoses:  Acute pain of left shoulder due to trauma  Fall, initial encounter     Discharge Instructions      Xray of left shoulder today did not clearly show any new fracture, and pain is located in the acromioclavicular area (different than area previously broken). Would recommend home physical therapy to improve range of motion and strength in left shoulder and arm over the next couple weeks, as immediate pain subsides, and to avoid frozen shoulder.  Followup with Dr Ginette Pitman for additional recommendations.   ED Prescriptions   None    PDMP not reviewed this encounter.   Wynona Luna, MD 11/14/21 1044

## 2021-11-15 DIAGNOSIS — R399 Unspecified symptoms and signs involving the genitourinary system: Secondary | ICD-10-CM | POA: Diagnosis not present

## 2021-11-15 DIAGNOSIS — R8290 Unspecified abnormal findings in urine: Secondary | ICD-10-CM | POA: Diagnosis not present

## 2021-11-22 DIAGNOSIS — M545 Low back pain, unspecified: Secondary | ICD-10-CM | POA: Diagnosis not present

## 2021-11-22 DIAGNOSIS — I890 Lymphedema, not elsewhere classified: Secondary | ICD-10-CM | POA: Diagnosis not present

## 2021-11-22 DIAGNOSIS — M79672 Pain in left foot: Secondary | ICD-10-CM | POA: Diagnosis not present

## 2021-11-22 DIAGNOSIS — I350 Nonrheumatic aortic (valve) stenosis: Secondary | ICD-10-CM | POA: Diagnosis not present

## 2021-11-22 DIAGNOSIS — Z6839 Body mass index (BMI) 39.0-39.9, adult: Secondary | ICD-10-CM | POA: Diagnosis not present

## 2021-11-22 DIAGNOSIS — M25475 Effusion, left foot: Secondary | ICD-10-CM | POA: Diagnosis not present

## 2021-11-22 DIAGNOSIS — M25512 Pain in left shoulder: Secondary | ICD-10-CM | POA: Diagnosis not present

## 2021-11-22 DIAGNOSIS — R600 Localized edema: Secondary | ICD-10-CM | POA: Diagnosis not present

## 2021-11-22 DIAGNOSIS — R262 Difficulty in walking, not elsewhere classified: Secondary | ICD-10-CM | POA: Diagnosis not present

## 2021-11-26 DIAGNOSIS — I359 Nonrheumatic aortic valve disorder, unspecified: Secondary | ICD-10-CM | POA: Diagnosis not present

## 2021-11-26 DIAGNOSIS — Z681 Body mass index (BMI) 19 or less, adult: Secondary | ICD-10-CM | POA: Diagnosis not present

## 2021-11-26 DIAGNOSIS — M0579 Rheumatoid arthritis with rheumatoid factor of multiple sites without organ or systems involvement: Secondary | ICD-10-CM | POA: Diagnosis not present

## 2021-12-06 DIAGNOSIS — M25512 Pain in left shoulder: Secondary | ICD-10-CM | POA: Diagnosis not present

## 2021-12-06 DIAGNOSIS — M79672 Pain in left foot: Secondary | ICD-10-CM | POA: Diagnosis not present

## 2021-12-06 DIAGNOSIS — R600 Localized edema: Secondary | ICD-10-CM | POA: Diagnosis not present

## 2021-12-06 DIAGNOSIS — M545 Low back pain, unspecified: Secondary | ICD-10-CM | POA: Diagnosis not present

## 2021-12-06 DIAGNOSIS — M25475 Effusion, left foot: Secondary | ICD-10-CM | POA: Diagnosis not present

## 2021-12-06 DIAGNOSIS — Z6839 Body mass index (BMI) 39.0-39.9, adult: Secondary | ICD-10-CM | POA: Diagnosis not present

## 2021-12-06 DIAGNOSIS — I890 Lymphedema, not elsewhere classified: Secondary | ICD-10-CM | POA: Diagnosis not present

## 2021-12-06 DIAGNOSIS — I350 Nonrheumatic aortic (valve) stenosis: Secondary | ICD-10-CM | POA: Diagnosis not present

## 2021-12-06 DIAGNOSIS — R262 Difficulty in walking, not elsewhere classified: Secondary | ICD-10-CM | POA: Diagnosis not present

## 2021-12-15 DIAGNOSIS — R399 Unspecified symptoms and signs involving the genitourinary system: Secondary | ICD-10-CM | POA: Diagnosis not present

## 2021-12-15 DIAGNOSIS — R8290 Unspecified abnormal findings in urine: Secondary | ICD-10-CM | POA: Diagnosis not present

## 2021-12-21 DIAGNOSIS — R600 Localized edema: Secondary | ICD-10-CM | POA: Diagnosis not present

## 2021-12-21 DIAGNOSIS — M79672 Pain in left foot: Secondary | ICD-10-CM | POA: Diagnosis not present

## 2021-12-21 DIAGNOSIS — M25475 Effusion, left foot: Secondary | ICD-10-CM | POA: Diagnosis not present

## 2021-12-21 DIAGNOSIS — I890 Lymphedema, not elsewhere classified: Secondary | ICD-10-CM | POA: Diagnosis not present

## 2021-12-21 DIAGNOSIS — I350 Nonrheumatic aortic (valve) stenosis: Secondary | ICD-10-CM | POA: Diagnosis not present

## 2021-12-21 DIAGNOSIS — R262 Difficulty in walking, not elsewhere classified: Secondary | ICD-10-CM | POA: Diagnosis not present

## 2021-12-28 DIAGNOSIS — J301 Allergic rhinitis due to pollen: Secondary | ICD-10-CM | POA: Diagnosis not present

## 2021-12-28 DIAGNOSIS — Z23 Encounter for immunization: Secondary | ICD-10-CM | POA: Diagnosis not present

## 2021-12-28 DIAGNOSIS — J986 Disorders of diaphragm: Secondary | ICD-10-CM | POA: Diagnosis not present

## 2021-12-28 DIAGNOSIS — J439 Emphysema, unspecified: Secondary | ICD-10-CM | POA: Diagnosis not present

## 2021-12-28 DIAGNOSIS — R0602 Shortness of breath: Secondary | ICD-10-CM | POA: Diagnosis not present

## 2021-12-28 DIAGNOSIS — Z6841 Body Mass Index (BMI) 40.0 and over, adult: Secondary | ICD-10-CM | POA: Diagnosis not present

## 2022-01-03 ENCOUNTER — Encounter (INDEPENDENT_AMBULATORY_CARE_PROVIDER_SITE_OTHER): Payer: Self-pay

## 2022-01-05 DIAGNOSIS — R8290 Unspecified abnormal findings in urine: Secondary | ICD-10-CM | POA: Diagnosis not present

## 2022-01-05 DIAGNOSIS — R7309 Other abnormal glucose: Secondary | ICD-10-CM | POA: Diagnosis not present

## 2022-01-05 DIAGNOSIS — M810 Age-related osteoporosis without current pathological fracture: Secondary | ICD-10-CM | POA: Diagnosis not present

## 2022-01-05 DIAGNOSIS — F3340 Major depressive disorder, recurrent, in remission, unspecified: Secondary | ICD-10-CM | POA: Diagnosis not present

## 2022-01-05 DIAGNOSIS — I350 Nonrheumatic aortic (valve) stenosis: Secondary | ICD-10-CM | POA: Diagnosis not present

## 2022-01-05 DIAGNOSIS — Z79899 Other long term (current) drug therapy: Secondary | ICD-10-CM | POA: Diagnosis not present

## 2022-01-05 DIAGNOSIS — Z0000 Encounter for general adult medical examination without abnormal findings: Secondary | ICD-10-CM | POA: Diagnosis not present

## 2022-01-05 DIAGNOSIS — M0579 Rheumatoid arthritis with rheumatoid factor of multiple sites without organ or systems involvement: Secondary | ICD-10-CM | POA: Diagnosis not present

## 2022-01-07 DIAGNOSIS — I350 Nonrheumatic aortic (valve) stenosis: Secondary | ICD-10-CM | POA: Diagnosis not present

## 2022-01-07 DIAGNOSIS — M79672 Pain in left foot: Secondary | ICD-10-CM | POA: Diagnosis not present

## 2022-01-07 DIAGNOSIS — R262 Difficulty in walking, not elsewhere classified: Secondary | ICD-10-CM | POA: Diagnosis not present

## 2022-01-07 DIAGNOSIS — M545 Low back pain, unspecified: Secondary | ICD-10-CM | POA: Diagnosis not present

## 2022-01-07 DIAGNOSIS — M25512 Pain in left shoulder: Secondary | ICD-10-CM | POA: Diagnosis not present

## 2022-01-07 DIAGNOSIS — M25475 Effusion, left foot: Secondary | ICD-10-CM | POA: Diagnosis not present

## 2022-01-07 DIAGNOSIS — J449 Chronic obstructive pulmonary disease, unspecified: Secondary | ICD-10-CM | POA: Diagnosis not present

## 2022-01-07 DIAGNOSIS — I890 Lymphedema, not elsewhere classified: Secondary | ICD-10-CM | POA: Diagnosis not present

## 2022-01-07 DIAGNOSIS — Z6839 Body mass index (BMI) 39.0-39.9, adult: Secondary | ICD-10-CM | POA: Diagnosis not present

## 2022-01-11 DIAGNOSIS — F3340 Major depressive disorder, recurrent, in remission, unspecified: Secondary | ICD-10-CM | POA: Diagnosis not present

## 2022-01-11 DIAGNOSIS — R7309 Other abnormal glucose: Secondary | ICD-10-CM | POA: Diagnosis not present

## 2022-01-11 DIAGNOSIS — M159 Polyosteoarthritis, unspecified: Secondary | ICD-10-CM | POA: Diagnosis not present

## 2022-01-11 DIAGNOSIS — Z0000 Encounter for general adult medical examination without abnormal findings: Secondary | ICD-10-CM | POA: Diagnosis not present

## 2022-01-11 DIAGNOSIS — R262 Difficulty in walking, not elsewhere classified: Secondary | ICD-10-CM | POA: Diagnosis not present

## 2022-01-11 DIAGNOSIS — R300 Dysuria: Secondary | ICD-10-CM | POA: Diagnosis not present

## 2022-01-11 DIAGNOSIS — J4520 Mild intermittent asthma, uncomplicated: Secondary | ICD-10-CM | POA: Diagnosis not present

## 2022-01-11 DIAGNOSIS — Z6841 Body Mass Index (BMI) 40.0 and over, adult: Secondary | ICD-10-CM | POA: Diagnosis not present

## 2022-01-11 DIAGNOSIS — I890 Lymphedema, not elsewhere classified: Secondary | ICD-10-CM | POA: Diagnosis not present

## 2022-01-11 DIAGNOSIS — N3941 Urge incontinence: Secondary | ICD-10-CM | POA: Diagnosis not present

## 2022-01-11 DIAGNOSIS — R29898 Other symptoms and signs involving the musculoskeletal system: Secondary | ICD-10-CM | POA: Diagnosis not present

## 2022-01-12 DIAGNOSIS — I359 Nonrheumatic aortic valve disorder, unspecified: Secondary | ICD-10-CM | POA: Diagnosis not present

## 2022-01-12 DIAGNOSIS — M0579 Rheumatoid arthritis with rheumatoid factor of multiple sites without organ or systems involvement: Secondary | ICD-10-CM | POA: Diagnosis not present

## 2022-01-12 DIAGNOSIS — Z681 Body mass index (BMI) 19 or less, adult: Secondary | ICD-10-CM | POA: Diagnosis not present

## 2022-01-20 DIAGNOSIS — Z885 Allergy status to narcotic agent status: Secondary | ICD-10-CM | POA: Diagnosis not present

## 2022-01-20 DIAGNOSIS — Z9049 Acquired absence of other specified parts of digestive tract: Secondary | ICD-10-CM | POA: Diagnosis not present

## 2022-01-20 DIAGNOSIS — N958 Other specified menopausal and perimenopausal disorders: Secondary | ICD-10-CM | POA: Diagnosis not present

## 2022-01-20 DIAGNOSIS — Z882 Allergy status to sulfonamides status: Secondary | ICD-10-CM | POA: Diagnosis not present

## 2022-01-20 DIAGNOSIS — Z888 Allergy status to other drugs, medicaments and biological substances status: Secondary | ICD-10-CM | POA: Diagnosis not present

## 2022-01-20 DIAGNOSIS — Z90710 Acquired absence of both cervix and uterus: Secondary | ICD-10-CM | POA: Diagnosis not present

## 2022-01-20 DIAGNOSIS — M1990 Unspecified osteoarthritis, unspecified site: Secondary | ICD-10-CM | POA: Diagnosis not present

## 2022-01-20 DIAGNOSIS — N390 Urinary tract infection, site not specified: Secondary | ICD-10-CM | POA: Diagnosis not present

## 2022-01-20 DIAGNOSIS — R300 Dysuria: Secondary | ICD-10-CM | POA: Diagnosis not present

## 2022-01-20 DIAGNOSIS — Z9089 Acquired absence of other organs: Secondary | ICD-10-CM | POA: Diagnosis not present

## 2022-01-20 DIAGNOSIS — Z9889 Other specified postprocedural states: Secondary | ICD-10-CM | POA: Diagnosis not present

## 2022-01-20 DIAGNOSIS — Z880 Allergy status to penicillin: Secondary | ICD-10-CM | POA: Diagnosis not present

## 2022-01-20 DIAGNOSIS — R350 Frequency of micturition: Secondary | ICD-10-CM | POA: Diagnosis not present

## 2022-01-20 DIAGNOSIS — K219 Gastro-esophageal reflux disease without esophagitis: Secondary | ICD-10-CM | POA: Diagnosis not present

## 2022-01-20 DIAGNOSIS — N3941 Urge incontinence: Secondary | ICD-10-CM | POA: Diagnosis not present

## 2022-01-20 DIAGNOSIS — E785 Hyperlipidemia, unspecified: Secondary | ICD-10-CM | POA: Diagnosis not present

## 2022-01-20 DIAGNOSIS — Z881 Allergy status to other antibiotic agents status: Secondary | ICD-10-CM | POA: Diagnosis not present

## 2022-02-08 DIAGNOSIS — Z6839 Body mass index (BMI) 39.0-39.9, adult: Secondary | ICD-10-CM | POA: Diagnosis not present

## 2022-02-08 DIAGNOSIS — M25512 Pain in left shoulder: Secondary | ICD-10-CM | POA: Diagnosis not present

## 2022-02-08 DIAGNOSIS — I890 Lymphedema, not elsewhere classified: Secondary | ICD-10-CM | POA: Diagnosis not present

## 2022-02-08 DIAGNOSIS — R262 Difficulty in walking, not elsewhere classified: Secondary | ICD-10-CM | POA: Diagnosis not present

## 2022-02-08 DIAGNOSIS — J449 Chronic obstructive pulmonary disease, unspecified: Secondary | ICD-10-CM | POA: Diagnosis not present

## 2022-02-08 DIAGNOSIS — M545 Low back pain, unspecified: Secondary | ICD-10-CM | POA: Diagnosis not present

## 2022-02-08 DIAGNOSIS — I350 Nonrheumatic aortic (valve) stenosis: Secondary | ICD-10-CM | POA: Diagnosis not present

## 2022-02-08 DIAGNOSIS — M79672 Pain in left foot: Secondary | ICD-10-CM | POA: Diagnosis not present

## 2022-02-08 DIAGNOSIS — M25475 Effusion, left foot: Secondary | ICD-10-CM | POA: Diagnosis not present

## 2022-02-10 DIAGNOSIS — J449 Chronic obstructive pulmonary disease, unspecified: Secondary | ICD-10-CM | POA: Diagnosis not present

## 2022-02-10 DIAGNOSIS — G8929 Other chronic pain: Secondary | ICD-10-CM | POA: Diagnosis not present

## 2022-02-10 DIAGNOSIS — E669 Obesity, unspecified: Secondary | ICD-10-CM | POA: Diagnosis not present

## 2022-02-10 DIAGNOSIS — M25475 Effusion, left foot: Secondary | ICD-10-CM | POA: Diagnosis not present

## 2022-02-10 DIAGNOSIS — M545 Low back pain, unspecified: Secondary | ICD-10-CM | POA: Diagnosis not present

## 2022-02-10 DIAGNOSIS — M069 Rheumatoid arthritis, unspecified: Secondary | ICD-10-CM | POA: Diagnosis not present

## 2022-02-10 DIAGNOSIS — M1991 Primary osteoarthritis, unspecified site: Secondary | ICD-10-CM | POA: Diagnosis not present

## 2022-02-10 DIAGNOSIS — Z9181 History of falling: Secondary | ICD-10-CM | POA: Diagnosis not present

## 2022-02-25 DIAGNOSIS — M25475 Effusion, left foot: Secondary | ICD-10-CM | POA: Diagnosis not present

## 2022-02-25 DIAGNOSIS — R262 Difficulty in walking, not elsewhere classified: Secondary | ICD-10-CM | POA: Diagnosis not present

## 2022-02-25 DIAGNOSIS — Z6839 Body mass index (BMI) 39.0-39.9, adult: Secondary | ICD-10-CM | POA: Diagnosis not present

## 2022-02-25 DIAGNOSIS — I350 Nonrheumatic aortic (valve) stenosis: Secondary | ICD-10-CM | POA: Diagnosis not present

## 2022-02-25 DIAGNOSIS — M545 Low back pain, unspecified: Secondary | ICD-10-CM | POA: Diagnosis not present

## 2022-02-25 DIAGNOSIS — M79672 Pain in left foot: Secondary | ICD-10-CM | POA: Diagnosis not present

## 2022-02-25 DIAGNOSIS — I890 Lymphedema, not elsewhere classified: Secondary | ICD-10-CM | POA: Diagnosis not present

## 2022-03-14 DIAGNOSIS — R399 Unspecified symptoms and signs involving the genitourinary system: Secondary | ICD-10-CM | POA: Diagnosis not present

## 2022-03-14 DIAGNOSIS — R8290 Unspecified abnormal findings in urine: Secondary | ICD-10-CM | POA: Diagnosis not present

## 2022-03-29 DIAGNOSIS — B351 Tinea unguium: Secondary | ICD-10-CM | POA: Diagnosis not present

## 2022-03-29 DIAGNOSIS — H6123 Impacted cerumen, bilateral: Secondary | ICD-10-CM | POA: Diagnosis not present

## 2022-03-29 DIAGNOSIS — M79674 Pain in right toe(s): Secondary | ICD-10-CM | POA: Diagnosis not present

## 2022-03-29 DIAGNOSIS — M810 Age-related osteoporosis without current pathological fracture: Secondary | ICD-10-CM | POA: Diagnosis not present

## 2022-03-29 DIAGNOSIS — M79675 Pain in left toe(s): Secondary | ICD-10-CM | POA: Diagnosis not present

## 2022-03-29 DIAGNOSIS — H903 Sensorineural hearing loss, bilateral: Secondary | ICD-10-CM | POA: Diagnosis not present

## 2022-03-29 DIAGNOSIS — L600 Ingrowing nail: Secondary | ICD-10-CM | POA: Diagnosis not present

## 2022-04-25 DIAGNOSIS — I10 Essential (primary) hypertension: Secondary | ICD-10-CM | POA: Diagnosis not present

## 2022-04-25 DIAGNOSIS — Z0000 Encounter for general adult medical examination without abnormal findings: Secondary | ICD-10-CM | POA: Diagnosis not present

## 2022-04-25 DIAGNOSIS — R29898 Other symptoms and signs involving the musculoskeletal system: Secondary | ICD-10-CM | POA: Diagnosis not present

## 2022-04-25 DIAGNOSIS — I890 Lymphedema, not elsewhere classified: Secondary | ICD-10-CM | POA: Diagnosis not present

## 2022-04-25 DIAGNOSIS — R262 Difficulty in walking, not elsewhere classified: Secondary | ICD-10-CM | POA: Diagnosis not present

## 2022-04-25 DIAGNOSIS — Z6841 Body Mass Index (BMI) 40.0 and over, adult: Secondary | ICD-10-CM | POA: Diagnosis not present

## 2022-04-25 DIAGNOSIS — R7309 Other abnormal glucose: Secondary | ICD-10-CM | POA: Diagnosis not present

## 2022-04-25 DIAGNOSIS — J4520 Mild intermittent asthma, uncomplicated: Secondary | ICD-10-CM | POA: Diagnosis not present

## 2022-04-25 DIAGNOSIS — F3340 Major depressive disorder, recurrent, in remission, unspecified: Secondary | ICD-10-CM | POA: Diagnosis not present

## 2022-04-25 DIAGNOSIS — Z79899 Other long term (current) drug therapy: Secondary | ICD-10-CM | POA: Diagnosis not present

## 2022-04-25 DIAGNOSIS — J4489 Other specified chronic obstructive pulmonary disease: Secondary | ICD-10-CM | POA: Diagnosis not present

## 2022-04-25 DIAGNOSIS — R058 Other specified cough: Secondary | ICD-10-CM | POA: Diagnosis not present

## 2022-04-25 DIAGNOSIS — N3941 Urge incontinence: Secondary | ICD-10-CM | POA: Diagnosis not present

## 2022-04-25 DIAGNOSIS — J986 Disorders of diaphragm: Secondary | ICD-10-CM | POA: Diagnosis not present

## 2022-04-25 DIAGNOSIS — J301 Allergic rhinitis due to pollen: Secondary | ICD-10-CM | POA: Diagnosis not present

## 2022-04-25 DIAGNOSIS — M159 Polyosteoarthritis, unspecified: Secondary | ICD-10-CM | POA: Diagnosis not present

## 2022-04-25 DIAGNOSIS — M0579 Rheumatoid arthritis with rheumatoid factor of multiple sites without organ or systems involvement: Secondary | ICD-10-CM | POA: Diagnosis not present

## 2022-05-12 DIAGNOSIS — Z0000 Encounter for general adult medical examination without abnormal findings: Secondary | ICD-10-CM | POA: Diagnosis not present

## 2022-05-12 DIAGNOSIS — Z6841 Body Mass Index (BMI) 40.0 and over, adult: Secondary | ICD-10-CM | POA: Diagnosis not present

## 2022-05-12 DIAGNOSIS — F3341 Major depressive disorder, recurrent, in partial remission: Secondary | ICD-10-CM | POA: Diagnosis not present

## 2022-05-12 DIAGNOSIS — Z9889 Other specified postprocedural states: Secondary | ICD-10-CM | POA: Diagnosis not present

## 2022-05-12 DIAGNOSIS — J4520 Mild intermittent asthma, uncomplicated: Secondary | ICD-10-CM | POA: Diagnosis not present

## 2022-05-12 DIAGNOSIS — R29898 Other symptoms and signs involving the musculoskeletal system: Secondary | ICD-10-CM | POA: Diagnosis not present

## 2022-05-12 DIAGNOSIS — M159 Polyosteoarthritis, unspecified: Secondary | ICD-10-CM | POA: Diagnosis not present

## 2022-05-12 DIAGNOSIS — R7309 Other abnormal glucose: Secondary | ICD-10-CM | POA: Diagnosis not present

## 2022-05-12 DIAGNOSIS — N3010 Interstitial cystitis (chronic) without hematuria: Secondary | ICD-10-CM | POA: Diagnosis not present

## 2022-05-12 DIAGNOSIS — G5793 Unspecified mononeuropathy of bilateral lower limbs: Secondary | ICD-10-CM | POA: Diagnosis not present

## 2022-05-12 DIAGNOSIS — R262 Difficulty in walking, not elsewhere classified: Secondary | ICD-10-CM | POA: Diagnosis not present

## 2022-05-12 DIAGNOSIS — M0579 Rheumatoid arthritis with rheumatoid factor of multiple sites without organ or systems involvement: Secondary | ICD-10-CM | POA: Diagnosis not present

## 2022-05-12 DIAGNOSIS — S22080S Wedge compression fracture of T11-T12 vertebra, sequela: Secondary | ICD-10-CM | POA: Diagnosis not present

## 2022-05-12 DIAGNOSIS — K219 Gastro-esophageal reflux disease without esophagitis: Secondary | ICD-10-CM | POA: Diagnosis not present

## 2022-05-17 DIAGNOSIS — E785 Hyperlipidemia, unspecified: Secondary | ICD-10-CM | POA: Diagnosis not present

## 2022-05-17 DIAGNOSIS — M545 Low back pain, unspecified: Secondary | ICD-10-CM | POA: Diagnosis not present

## 2022-05-17 DIAGNOSIS — J449 Chronic obstructive pulmonary disease, unspecified: Secondary | ICD-10-CM | POA: Diagnosis not present

## 2022-05-17 DIAGNOSIS — M810 Age-related osteoporosis without current pathological fracture: Secondary | ICD-10-CM | POA: Diagnosis not present

## 2022-05-17 DIAGNOSIS — M1389 Other specified arthritis, multiple sites: Secondary | ICD-10-CM | POA: Diagnosis not present

## 2022-05-17 DIAGNOSIS — E669 Obesity, unspecified: Secondary | ICD-10-CM | POA: Diagnosis not present

## 2022-05-17 DIAGNOSIS — S22080D Wedge compression fracture of T11-T12 vertebra, subsequent encounter for fracture with routine healing: Secondary | ICD-10-CM | POA: Diagnosis not present

## 2022-05-17 DIAGNOSIS — M6281 Muscle weakness (generalized): Secondary | ICD-10-CM | POA: Diagnosis not present

## 2022-05-17 DIAGNOSIS — M069 Rheumatoid arthritis, unspecified: Secondary | ICD-10-CM | POA: Diagnosis not present

## 2022-05-17 DIAGNOSIS — I872 Venous insufficiency (chronic) (peripheral): Secondary | ICD-10-CM | POA: Diagnosis not present

## 2022-05-20 DIAGNOSIS — M545 Low back pain, unspecified: Secondary | ICD-10-CM | POA: Diagnosis not present

## 2022-05-20 DIAGNOSIS — M6281 Muscle weakness (generalized): Secondary | ICD-10-CM | POA: Diagnosis not present

## 2022-05-20 DIAGNOSIS — S22080D Wedge compression fracture of T11-T12 vertebra, subsequent encounter for fracture with routine healing: Secondary | ICD-10-CM | POA: Diagnosis not present

## 2022-05-20 DIAGNOSIS — M810 Age-related osteoporosis without current pathological fracture: Secondary | ICD-10-CM | POA: Diagnosis not present

## 2022-05-20 DIAGNOSIS — M1389 Other specified arthritis, multiple sites: Secondary | ICD-10-CM | POA: Diagnosis not present

## 2022-05-20 DIAGNOSIS — M069 Rheumatoid arthritis, unspecified: Secondary | ICD-10-CM | POA: Diagnosis not present

## 2022-05-20 DIAGNOSIS — J449 Chronic obstructive pulmonary disease, unspecified: Secondary | ICD-10-CM | POA: Diagnosis not present

## 2022-06-06 DIAGNOSIS — I872 Venous insufficiency (chronic) (peripheral): Secondary | ICD-10-CM | POA: Diagnosis not present

## 2022-06-06 DIAGNOSIS — J449 Chronic obstructive pulmonary disease, unspecified: Secondary | ICD-10-CM | POA: Diagnosis not present

## 2022-06-06 DIAGNOSIS — S22080D Wedge compression fracture of T11-T12 vertebra, subsequent encounter for fracture with routine healing: Secondary | ICD-10-CM | POA: Diagnosis not present

## 2022-06-06 DIAGNOSIS — M069 Rheumatoid arthritis, unspecified: Secondary | ICD-10-CM | POA: Diagnosis not present

## 2022-06-06 DIAGNOSIS — M545 Low back pain, unspecified: Secondary | ICD-10-CM | POA: Diagnosis not present

## 2022-06-06 DIAGNOSIS — M6281 Muscle weakness (generalized): Secondary | ICD-10-CM | POA: Diagnosis not present

## 2022-06-06 DIAGNOSIS — E785 Hyperlipidemia, unspecified: Secondary | ICD-10-CM | POA: Diagnosis not present

## 2022-06-06 DIAGNOSIS — M810 Age-related osteoporosis without current pathological fracture: Secondary | ICD-10-CM | POA: Diagnosis not present

## 2022-06-06 DIAGNOSIS — M1389 Other specified arthritis, multiple sites: Secondary | ICD-10-CM | POA: Diagnosis not present

## 2022-06-06 DIAGNOSIS — E669 Obesity, unspecified: Secondary | ICD-10-CM | POA: Diagnosis not present

## 2022-06-27 DIAGNOSIS — R300 Dysuria: Secondary | ICD-10-CM | POA: Diagnosis not present

## 2022-06-28 ENCOUNTER — Telehealth (INDEPENDENT_AMBULATORY_CARE_PROVIDER_SITE_OTHER): Payer: Self-pay

## 2022-06-28 NOTE — Telephone Encounter (Signed)
I think that 1 hour twice a day is little bit extensive.  The patient may not be having any swelling because she has been utilizing her lymphedema pump so consistently.  Typically for patients with lymphedema the recommended treatment is for use of medical grade compression stockings, elevation and activity.  However for patients who are not able to wear compression regularly the lymphedema pump is a way to help keep the swelling under control.  If they feel that the compression sleeves or not necessary she can stop using them however if she begins to notice that the edema is increasing upon stopping, then that is evidence that the pump itself was actually keeping her edema when she should resume usage.  Alternatively, if they do not want to stop altogether they can start trying to use it once a day in the evenings versus twice a day if that is more tolerable for them.

## 2022-06-29 NOTE — Telephone Encounter (Signed)
Spoke with pt and she states understanding and will tell her daughter in law that she can go down to one time a day usage and if edema comes back go back to twice a day with the pump.

## 2022-07-06 DIAGNOSIS — M545 Low back pain, unspecified: Secondary | ICD-10-CM | POA: Diagnosis not present

## 2022-07-06 DIAGNOSIS — I872 Venous insufficiency (chronic) (peripheral): Secondary | ICD-10-CM | POA: Diagnosis not present

## 2022-07-06 DIAGNOSIS — E669 Obesity, unspecified: Secondary | ICD-10-CM | POA: Diagnosis not present

## 2022-07-06 DIAGNOSIS — M810 Age-related osteoporosis without current pathological fracture: Secondary | ICD-10-CM | POA: Diagnosis not present

## 2022-07-06 DIAGNOSIS — M6281 Muscle weakness (generalized): Secondary | ICD-10-CM | POA: Diagnosis not present

## 2022-07-06 DIAGNOSIS — S22080D Wedge compression fracture of T11-T12 vertebra, subsequent encounter for fracture with routine healing: Secondary | ICD-10-CM | POA: Diagnosis not present

## 2022-07-06 DIAGNOSIS — E785 Hyperlipidemia, unspecified: Secondary | ICD-10-CM | POA: Diagnosis not present

## 2022-07-06 DIAGNOSIS — M069 Rheumatoid arthritis, unspecified: Secondary | ICD-10-CM | POA: Diagnosis not present

## 2022-07-06 DIAGNOSIS — J449 Chronic obstructive pulmonary disease, unspecified: Secondary | ICD-10-CM | POA: Diagnosis not present

## 2022-07-06 DIAGNOSIS — M1389 Other specified arthritis, multiple sites: Secondary | ICD-10-CM | POA: Diagnosis not present

## 2022-07-13 DIAGNOSIS — R8290 Unspecified abnormal findings in urine: Secondary | ICD-10-CM | POA: Diagnosis not present

## 2022-07-13 DIAGNOSIS — R399 Unspecified symptoms and signs involving the genitourinary system: Secondary | ICD-10-CM | POA: Diagnosis not present

## 2022-07-28 DIAGNOSIS — I359 Nonrheumatic aortic valve disorder, unspecified: Secondary | ICD-10-CM | POA: Diagnosis not present

## 2022-07-28 DIAGNOSIS — M0579 Rheumatoid arthritis with rheumatoid factor of multiple sites without organ or systems involvement: Secondary | ICD-10-CM | POA: Diagnosis not present

## 2022-07-28 DIAGNOSIS — Z681 Body mass index (BMI) 19 or less, adult: Secondary | ICD-10-CM | POA: Diagnosis not present

## 2022-08-22 DIAGNOSIS — Z79899 Other long term (current) drug therapy: Secondary | ICD-10-CM | POA: Diagnosis not present

## 2022-08-22 DIAGNOSIS — H26493 Other secondary cataract, bilateral: Secondary | ICD-10-CM | POA: Diagnosis not present

## 2022-08-22 DIAGNOSIS — M0579 Rheumatoid arthritis with rheumatoid factor of multiple sites without organ or systems involvement: Secondary | ICD-10-CM | POA: Diagnosis not present

## 2022-08-22 DIAGNOSIS — R0609 Other forms of dyspnea: Secondary | ICD-10-CM | POA: Diagnosis not present

## 2022-08-22 DIAGNOSIS — J439 Emphysema, unspecified: Secondary | ICD-10-CM | POA: Diagnosis not present

## 2022-08-22 DIAGNOSIS — T372X5A Adverse effect of antimalarials and drugs acting on other blood protozoa, initial encounter: Secondary | ICD-10-CM | POA: Diagnosis not present

## 2022-08-22 DIAGNOSIS — R942 Abnormal results of pulmonary function studies: Secondary | ICD-10-CM | POA: Diagnosis not present

## 2022-08-22 DIAGNOSIS — J301 Allergic rhinitis due to pollen: Secondary | ICD-10-CM | POA: Diagnosis not present

## 2022-08-22 DIAGNOSIS — H1045 Other chronic allergic conjunctivitis: Secondary | ICD-10-CM | POA: Diagnosis not present

## 2022-08-22 DIAGNOSIS — H35372 Puckering of macula, left eye: Secondary | ICD-10-CM | POA: Diagnosis not present

## 2022-08-22 DIAGNOSIS — H35389 Toxic maculopathy, unspecified eye: Secondary | ICD-10-CM | POA: Diagnosis not present

## 2022-09-27 DIAGNOSIS — R399 Unspecified symptoms and signs involving the genitourinary system: Secondary | ICD-10-CM | POA: Diagnosis not present

## 2022-09-27 DIAGNOSIS — R8290 Unspecified abnormal findings in urine: Secondary | ICD-10-CM | POA: Diagnosis not present

## 2022-10-17 DIAGNOSIS — Z961 Presence of intraocular lens: Secondary | ICD-10-CM | POA: Diagnosis not present

## 2022-10-17 DIAGNOSIS — H04123 Dry eye syndrome of bilateral lacrimal glands: Secondary | ICD-10-CM | POA: Diagnosis not present

## 2022-10-17 DIAGNOSIS — H26491 Other secondary cataract, right eye: Secondary | ICD-10-CM | POA: Diagnosis not present

## 2022-11-01 DIAGNOSIS — J4520 Mild intermittent asthma, uncomplicated: Secondary | ICD-10-CM | POA: Diagnosis not present

## 2022-11-01 DIAGNOSIS — K219 Gastro-esophageal reflux disease without esophagitis: Secondary | ICD-10-CM | POA: Diagnosis not present

## 2022-11-01 DIAGNOSIS — R8290 Unspecified abnormal findings in urine: Secondary | ICD-10-CM | POA: Diagnosis not present

## 2022-11-01 DIAGNOSIS — G5793 Unspecified mononeuropathy of bilateral lower limbs: Secondary | ICD-10-CM | POA: Diagnosis not present

## 2022-11-01 DIAGNOSIS — E785 Hyperlipidemia, unspecified: Secondary | ICD-10-CM | POA: Diagnosis not present

## 2022-11-01 DIAGNOSIS — Z9889 Other specified postprocedural states: Secondary | ICD-10-CM | POA: Diagnosis not present

## 2022-11-01 DIAGNOSIS — M159 Polyosteoarthritis, unspecified: Secondary | ICD-10-CM | POA: Diagnosis not present

## 2022-11-01 DIAGNOSIS — S22080S Wedge compression fracture of T11-T12 vertebra, sequela: Secondary | ICD-10-CM | POA: Diagnosis not present

## 2022-11-01 DIAGNOSIS — R262 Difficulty in walking, not elsewhere classified: Secondary | ICD-10-CM | POA: Diagnosis not present

## 2022-11-01 DIAGNOSIS — M0579 Rheumatoid arthritis with rheumatoid factor of multiple sites without organ or systems involvement: Secondary | ICD-10-CM | POA: Diagnosis not present

## 2022-11-01 DIAGNOSIS — N3010 Interstitial cystitis (chronic) without hematuria: Secondary | ICD-10-CM | POA: Diagnosis not present

## 2022-11-01 DIAGNOSIS — R7309 Other abnormal glucose: Secondary | ICD-10-CM | POA: Diagnosis not present

## 2022-11-01 DIAGNOSIS — R29898 Other symptoms and signs involving the musculoskeletal system: Secondary | ICD-10-CM | POA: Diagnosis not present

## 2022-11-15 DIAGNOSIS — M818 Other osteoporosis without current pathological fracture: Secondary | ICD-10-CM | POA: Diagnosis not present

## 2022-11-15 DIAGNOSIS — M0579 Rheumatoid arthritis with rheumatoid factor of multiple sites without organ or systems involvement: Secondary | ICD-10-CM | POA: Diagnosis not present

## 2022-11-15 DIAGNOSIS — N390 Urinary tract infection, site not specified: Secondary | ICD-10-CM | POA: Diagnosis not present

## 2022-11-15 DIAGNOSIS — Z6841 Body Mass Index (BMI) 40.0 and over, adult: Secondary | ICD-10-CM | POA: Diagnosis not present

## 2022-11-15 DIAGNOSIS — R262 Difficulty in walking, not elsewhere classified: Secondary | ICD-10-CM | POA: Diagnosis not present

## 2022-11-15 DIAGNOSIS — M159 Polyosteoarthritis, unspecified: Secondary | ICD-10-CM | POA: Diagnosis not present

## 2022-11-15 DIAGNOSIS — K5909 Other constipation: Secondary | ICD-10-CM | POA: Diagnosis not present

## 2022-11-15 DIAGNOSIS — B964 Proteus (mirabilis) (morganii) as the cause of diseases classified elsewhere: Secondary | ICD-10-CM | POA: Diagnosis not present

## 2022-11-15 DIAGNOSIS — F3340 Major depressive disorder, recurrent, in remission, unspecified: Secondary | ICD-10-CM | POA: Diagnosis not present

## 2022-11-15 DIAGNOSIS — R7309 Other abnormal glucose: Secondary | ICD-10-CM | POA: Diagnosis not present

## 2022-11-17 DIAGNOSIS — R399 Unspecified symptoms and signs involving the genitourinary system: Secondary | ICD-10-CM | POA: Diagnosis not present

## 2022-11-17 DIAGNOSIS — N390 Urinary tract infection, site not specified: Secondary | ICD-10-CM | POA: Diagnosis not present

## 2022-11-25 ENCOUNTER — Ambulatory Visit: Payer: Self-pay | Admitting: Urology

## 2022-11-30 DIAGNOSIS — R300 Dysuria: Secondary | ICD-10-CM | POA: Diagnosis not present

## 2022-11-30 DIAGNOSIS — J439 Emphysema, unspecified: Secondary | ICD-10-CM | POA: Diagnosis not present

## 2022-11-30 DIAGNOSIS — M0579 Rheumatoid arthritis with rheumatoid factor of multiple sites without organ or systems involvement: Secondary | ICD-10-CM | POA: Diagnosis not present

## 2022-11-30 DIAGNOSIS — J986 Disorders of diaphragm: Secondary | ICD-10-CM | POA: Diagnosis not present

## 2022-11-30 DIAGNOSIS — J301 Allergic rhinitis due to pollen: Secondary | ICD-10-CM | POA: Diagnosis not present

## 2022-11-30 DIAGNOSIS — M159 Polyosteoarthritis, unspecified: Secondary | ICD-10-CM | POA: Diagnosis not present

## 2022-11-30 DIAGNOSIS — Z79899 Other long term (current) drug therapy: Secondary | ICD-10-CM | POA: Diagnosis not present

## 2022-11-30 DIAGNOSIS — Z6841 Body Mass Index (BMI) 40.0 and over, adult: Secondary | ICD-10-CM | POA: Diagnosis not present

## 2022-12-06 DIAGNOSIS — R399 Unspecified symptoms and signs involving the genitourinary system: Secondary | ICD-10-CM | POA: Diagnosis not present

## 2022-12-08 DIAGNOSIS — N390 Urinary tract infection, site not specified: Secondary | ICD-10-CM | POA: Diagnosis not present

## 2022-12-08 DIAGNOSIS — N958 Other specified menopausal and perimenopausal disorders: Secondary | ICD-10-CM | POA: Diagnosis not present

## 2022-12-08 DIAGNOSIS — R350 Frequency of micturition: Secondary | ICD-10-CM | POA: Diagnosis not present

## 2022-12-08 DIAGNOSIS — N3000 Acute cystitis without hematuria: Secondary | ICD-10-CM | POA: Diagnosis not present

## 2022-12-08 DIAGNOSIS — N3010 Interstitial cystitis (chronic) without hematuria: Secondary | ICD-10-CM | POA: Diagnosis not present

## 2022-12-19 DIAGNOSIS — J811 Chronic pulmonary edema: Secondary | ICD-10-CM | POA: Diagnosis not present

## 2022-12-19 DIAGNOSIS — J439 Emphysema, unspecified: Secondary | ICD-10-CM | POA: Diagnosis not present

## 2022-12-19 DIAGNOSIS — I517 Cardiomegaly: Secondary | ICD-10-CM | POA: Diagnosis not present

## 2022-12-19 DIAGNOSIS — E662 Morbid (severe) obesity with alveolar hypoventilation: Secondary | ICD-10-CM | POA: Diagnosis not present

## 2022-12-19 DIAGNOSIS — J986 Disorders of diaphragm: Secondary | ICD-10-CM | POA: Diagnosis not present

## 2022-12-19 DIAGNOSIS — R0989 Other specified symptoms and signs involving the circulatory and respiratory systems: Secondary | ICD-10-CM | POA: Diagnosis not present

## 2022-12-19 DIAGNOSIS — R0602 Shortness of breath: Secondary | ICD-10-CM | POA: Diagnosis not present

## 2022-12-19 DIAGNOSIS — R9389 Abnormal findings on diagnostic imaging of other specified body structures: Secondary | ICD-10-CM | POA: Diagnosis not present

## 2023-10-06 DEATH — deceased
